# Patient Record
Sex: Female | Born: 1940 | Race: White | Hispanic: No | Smoking: Never smoker
Health system: Southern US, Community
[De-identification: ages and names within clinical notes are randomized; demographics above are authoritative.]

## PROBLEM LIST (undated history)

## (undated) DIAGNOSIS — G43909 Migraine, unspecified, not intractable, without status migrainosus: Secondary | ICD-10-CM

## (undated) DIAGNOSIS — N200 Calculus of kidney: Secondary | ICD-10-CM

## (undated) DIAGNOSIS — T7840XA Allergy, unspecified, initial encounter: Secondary | ICD-10-CM

## (undated) DIAGNOSIS — Z87442 Personal history of urinary calculi: Secondary | ICD-10-CM

## (undated) DIAGNOSIS — E0500 Thyrotoxicosis with diffuse goiter without thyrotoxic crisis or storm: Secondary | ICD-10-CM

## (undated) DIAGNOSIS — M8580 Other specified disorders of bone density and structure, unspecified site: Secondary | ICD-10-CM

## (undated) DIAGNOSIS — E079 Disorder of thyroid, unspecified: Secondary | ICD-10-CM

## (undated) DIAGNOSIS — E782 Mixed hyperlipidemia: Secondary | ICD-10-CM

## (undated) DIAGNOSIS — E039 Hypothyroidism, unspecified: Secondary | ICD-10-CM

## (undated) DIAGNOSIS — I10 Essential (primary) hypertension: Secondary | ICD-10-CM

## (undated) DIAGNOSIS — H8109 Meniere's disease, unspecified ear: Secondary | ICD-10-CM

## (undated) DIAGNOSIS — C4491 Basal cell carcinoma of skin, unspecified: Secondary | ICD-10-CM

## (undated) DIAGNOSIS — J309 Allergic rhinitis, unspecified: Secondary | ICD-10-CM

## (undated) DIAGNOSIS — E119 Type 2 diabetes mellitus without complications: Secondary | ICD-10-CM

## (undated) DIAGNOSIS — E559 Vitamin D deficiency, unspecified: Secondary | ICD-10-CM

## (undated) DIAGNOSIS — H409 Unspecified glaucoma: Secondary | ICD-10-CM

## (undated) DIAGNOSIS — C801 Malignant (primary) neoplasm, unspecified: Secondary | ICD-10-CM

## (undated) DIAGNOSIS — H269 Unspecified cataract: Secondary | ICD-10-CM

## (undated) DIAGNOSIS — E785 Hyperlipidemia, unspecified: Secondary | ICD-10-CM

## (undated) DIAGNOSIS — T8859XA Other complications of anesthesia, initial encounter: Secondary | ICD-10-CM

## (undated) HISTORY — DX: Mixed hyperlipidemia: E78.2

## (undated) HISTORY — DX: Calculus of kidney: N20.0

## (undated) HISTORY — PX: SPINE SURGERY: SHX786

## (undated) HISTORY — DX: Vitamin D deficiency, unspecified: E55.9

## (undated) HISTORY — DX: Migraine, unspecified, not intractable, without status migrainosus: G43.909

## (undated) HISTORY — DX: Meniere's disease, unspecified ear: H81.09

## (undated) HISTORY — PX: CATARACT EXTRACTION: SUR2

## (undated) HISTORY — DX: Hyperlipidemia, unspecified: E78.5

## (undated) HISTORY — DX: Malignant (primary) neoplasm, unspecified: C80.1

## (undated) HISTORY — PX: FLEXIBLE SIGMOIDOSCOPY: SHX1649

## (undated) HISTORY — PX: ABDOMINAL HYSTERECTOMY: SHX81

## (undated) HISTORY — DX: Unspecified glaucoma: H40.9

## (undated) HISTORY — DX: Disorder of thyroid, unspecified: E07.9

## (undated) HISTORY — DX: Essential (primary) hypertension: I10

## (undated) HISTORY — DX: Thyrotoxicosis with diffuse goiter without thyrotoxic crisis or storm: E05.00

## (undated) HISTORY — PX: OTHER SURGICAL HISTORY: SHX169

## (undated) HISTORY — DX: Type 2 diabetes mellitus without complications: E11.9

## (undated) HISTORY — DX: Other specified disorders of bone density and structure, unspecified site: M85.80

## (undated) HISTORY — PX: EYE SURGERY: SHX253

## (undated) HISTORY — DX: Allergy, unspecified, initial encounter: T78.40XA

## (undated) HISTORY — DX: Allergic rhinitis, unspecified: J30.9

## (undated) HISTORY — PX: COLONOSCOPY: SHX174

## (undated) HISTORY — DX: Basal cell carcinoma of skin, unspecified: C44.91

## (undated) HISTORY — DX: Unspecified cataract: H26.9

---

## 1999-11-29 ENCOUNTER — Other Ambulatory Visit: Admission: RE | Admit: 1999-11-29 | Discharge: 1999-11-29 | Payer: Self-pay | Admitting: Podiatry

## 2000-08-23 ENCOUNTER — Encounter: Admission: RE | Admit: 2000-08-23 | Discharge: 2000-08-23 | Payer: Self-pay | Admitting: Family Medicine

## 2000-08-23 ENCOUNTER — Encounter: Payer: Self-pay | Admitting: Family Medicine

## 2000-08-29 ENCOUNTER — Encounter: Admission: RE | Admit: 2000-08-29 | Discharge: 2000-08-29 | Payer: Self-pay | Admitting: Family Medicine

## 2000-08-29 ENCOUNTER — Encounter: Payer: Self-pay | Admitting: Family Medicine

## 2000-09-05 ENCOUNTER — Encounter: Payer: Self-pay | Admitting: Family Medicine

## 2000-09-05 ENCOUNTER — Encounter: Admission: RE | Admit: 2000-09-05 | Discharge: 2000-09-05 | Payer: Self-pay | Admitting: Family Medicine

## 2000-09-13 ENCOUNTER — Encounter: Admission: RE | Admit: 2000-09-13 | Discharge: 2000-09-13 | Payer: Self-pay | Admitting: Family Medicine

## 2000-09-13 ENCOUNTER — Encounter: Payer: Self-pay | Admitting: Family Medicine

## 2000-09-14 ENCOUNTER — Encounter: Admission: RE | Admit: 2000-09-14 | Discharge: 2000-09-14 | Payer: Self-pay | Admitting: Family Medicine

## 2000-09-14 ENCOUNTER — Encounter: Payer: Self-pay | Admitting: Family Medicine

## 2001-02-12 ENCOUNTER — Encounter: Payer: Self-pay | Admitting: Family Medicine

## 2001-02-12 ENCOUNTER — Encounter: Admission: RE | Admit: 2001-02-12 | Discharge: 2001-02-12 | Payer: Self-pay | Admitting: Family Medicine

## 2001-12-20 ENCOUNTER — Inpatient Hospital Stay (HOSPITAL_COMMUNITY): Admission: EM | Admit: 2001-12-20 | Discharge: 2001-12-23 | Payer: Self-pay | Admitting: *Deleted

## 2001-12-20 ENCOUNTER — Encounter: Payer: Self-pay | Admitting: Internal Medicine

## 2002-01-06 ENCOUNTER — Encounter: Payer: Self-pay | Admitting: Urology

## 2002-01-06 ENCOUNTER — Ambulatory Visit (HOSPITAL_COMMUNITY): Admission: RE | Admit: 2002-01-06 | Discharge: 2002-01-06 | Payer: Self-pay | Admitting: Urology

## 2002-11-18 ENCOUNTER — Encounter: Admission: RE | Admit: 2002-11-18 | Discharge: 2002-11-18 | Payer: Self-pay | Admitting: Family Medicine

## 2002-11-18 ENCOUNTER — Encounter: Payer: Self-pay | Admitting: Family Medicine

## 2002-12-09 ENCOUNTER — Encounter: Payer: Self-pay | Admitting: Emergency Medicine

## 2002-12-09 ENCOUNTER — Inpatient Hospital Stay (HOSPITAL_COMMUNITY): Admission: EM | Admit: 2002-12-09 | Discharge: 2002-12-10 | Payer: Self-pay | Admitting: Emergency Medicine

## 2003-01-13 ENCOUNTER — Ambulatory Visit (HOSPITAL_COMMUNITY): Admission: RE | Admit: 2003-01-13 | Discharge: 2003-01-13 | Payer: Self-pay | Admitting: Gastroenterology

## 2004-06-20 ENCOUNTER — Encounter: Admission: RE | Admit: 2004-06-20 | Discharge: 2004-06-20 | Payer: Self-pay | Admitting: Family Medicine

## 2004-09-20 ENCOUNTER — Encounter: Admission: RE | Admit: 2004-09-20 | Discharge: 2004-09-20 | Payer: Self-pay | Admitting: Family Medicine

## 2006-04-12 ENCOUNTER — Encounter: Admission: RE | Admit: 2006-04-12 | Discharge: 2006-04-12 | Payer: Self-pay | Admitting: Family Medicine

## 2007-04-29 ENCOUNTER — Encounter: Admission: RE | Admit: 2007-04-29 | Discharge: 2007-04-29 | Payer: Self-pay | Admitting: Family Medicine

## 2008-05-12 ENCOUNTER — Encounter: Admission: RE | Admit: 2008-05-12 | Discharge: 2008-05-12 | Payer: Self-pay | Admitting: Family Medicine

## 2008-07-27 ENCOUNTER — Encounter: Admission: RE | Admit: 2008-07-27 | Discharge: 2008-07-27 | Payer: Self-pay | Admitting: Family Medicine

## 2009-03-25 ENCOUNTER — Encounter: Admission: RE | Admit: 2009-03-25 | Discharge: 2009-03-25 | Payer: Self-pay | Admitting: Gastroenterology

## 2009-06-22 ENCOUNTER — Encounter (HOSPITAL_COMMUNITY): Admission: RE | Admit: 2009-06-22 | Discharge: 2009-09-10 | Payer: Self-pay | Admitting: Internal Medicine

## 2009-06-28 ENCOUNTER — Ambulatory Visit (HOSPITAL_COMMUNITY): Admission: RE | Admit: 2009-06-28 | Discharge: 2009-06-28 | Payer: Self-pay | Admitting: Internal Medicine

## 2010-06-26 ENCOUNTER — Encounter: Payer: Self-pay | Admitting: Internal Medicine

## 2010-10-21 NOTE — Cardiovascular Report (Signed)
[name], [name] NO.:  [id number]   MEDICAL RECORD NO.:  [id number]                   PATIENT TYPE:  INP   LOCATION:  3703                                 FACILITY:  [facility]   PHYSICIAN:  Francisca December, M.D.               DATE OF BIRTH:  04/30/1941   DATE OF PROCEDURE:  12/10/2002  DATE OF DISCHARGE:                              CARDIAC CATHETERIZATION   PROCEDURES PERFORMED:  1. Left heart catheterization.  2. Coronary angiography.  3. Left ventriculogram.   CARDIOLOGIST:  Francisca December, M.D.   INDICATIONS:  Melissa Davenport is 70 year old woman admitted yesterday with  prolonged anterior upper substernal chest discomfort.  This was relieved  with IV nitroglycerin.  She had new T wave inversions on her EKG in the  anterior precordial leads.  A myocardial infarction has been ruled out.  She  is brought now to the cardiac catheterization laboratory to identify  possible CAD as an etiology and provide for further therapeutic options.   PROCEDURAL NOTE:  The patient was brought the cardiac catheterization  laboratory in the fasting state.  The right groin was prepped and draped in  the usual sterile fashion.  Local anesthesia was obtained with the  infiltration of 1% lidocaine.  A 6 French catheter sheath was inserted  percutaneously into the right femoral artery utilizing an anterior approach  over a guiding J wire.  The 110 cm pigtail catheter was used to measure  pressures in the ascending aorta and in the left ventricle, both to and  following the ventriculogram.  A 30-degree RAO cine left ventriculogram was  performed utilizing a power injector.  Cine angiography of each coronary  artery was then conducted using 6 [place] #4 left and right Judkins  catheters.  Cine angiography was performed in multiple LAO and RAO  projections.   At the conclusion of the procedure the catheter and catheter sheath were  removed.  Hemostasis was achieved by  direct pressure.  The patient was  transported to he recovery area in stable condition with an intact distal  pulse.  All catheter manipulations were performed using fluoroscopic  observation and stages over a long guiding J wire.   HEMODYNAMIC DATA:  Systemic arterial pressure was 133/71 with a mean of 96  mmHg.  There was no systolic gradient across the aortic valve.  The left  ventricular end diastolic pressure was 22 mmHg preventriculogram.   ANGIOGRAPHIC DATA:  The left ventriculogram demonstrated normal chamber size  and normal global systolic function.  There were no regional wall motion  abnormalities or coronary calcifications seen.  There was no mitral  regurgitation.  A visual estimate of the ejection fraction was 65%.   There was a right dominant coronary system present.  The left main coronary  artery was normal.   The left anterior descending and its branches were normal.   The left  circumflex coronary artery and its branches were normal.   The right coronary artery and its branches were normal.   No collateral vessels were seen.   FINAL IMPRESSION:  1. Noncardiac ischemic chest pain, likely gastroesophageal reflux disease.  2. Normal coronary arteries.  3. Intact left ventricular size and global systolic function.   PLANS AND RECOMMENDATIONS:  The patient is presented with this gratifying  news.  It would appear that her chest discomfort is likely due to GERD.  We  will begin proton pump inhibitor and plan for outpatient discharge later in  today.                                                 Francisca December, M.D.    JHE/MEDQ  D:  12/10/2002  T:  12/10/2002  Job:  [id number]  Dellis Anes. Idell Pickles, M.D.  [address]  [place]  [place] [address]  Fax: [phone]   Cardiac Catheterization Laboratory   cc:   Dellis Anes. Idell Pickles, M.D.  [address]  [place]  [place] [address]  Fax: [phone]   Cardiac Catheterization Laboratory

## 2010-10-21 NOTE — Op Note (Signed)
TNAMEMCKINZI, ERIKSEN NO.:  [id number]   MEDICAL RECORD NO.:  [id number]                  PATIENT TYPE:   LOCATION:                                       FACILITY:   PHYSICIAN:  Bertram Millard. Dahlstedt, M.D.          DATE OF BIRTH:   DATE OF PROCEDURE:  01/06/2002  DATE OF DISCHARGE:                                 OPERATIVE REPORT   PREOPERATIVE DIAGNOSIS:  Right ureteral calculus with history of  pyelonephritis.   POSTOPERATIVE DIAGNOSIS:  No evidence of ureteral calculus.   PRINCIPAL PROCEDURE:  Cystoscopy, right ureteropyeloscopy.   SURGEON:  Bertram Millard. Dahlstedt, M.D.   ANESTHESIA:  General with LMA.   COMPLICATIONS:  None.   BRIEF HISTORY:  This 70 year old female is status post cystoscopy and  ureteral stent placement on the 18th of July.  She presented with  pyelonephritis and an obstructing right ureteral stone.  An emergent  ureteral stent was placed.  The patient has been recovering from her  pyelonephritis, albeit slowly.  She has had no recent history of fever.   She has been followed up in the office with a KUB.  I have not seen the  stone on the KUB but felt that this may have been a uric acid stone.  It was  recommended that she undergo cystoscopy, stent extraction, and ureteroscopy  and possible extraction of her right ureteral stone.  Risks and  complications of this procedure have been discussed with the patient and are  understood.   DESCRIPTION OF PROCEDURE:  The patient was taken to the operating room after  IV gentamycin was administered intravenously.  She was given a general  anesthetic and placed in the dorsal lithotomy position.  Cystoscopy and  stent extraction was performed.  No stone was seen lying in the bladder.  Rigid and flexible ureteroscopy were then performed on the right ureter.  I  failed to see any stone in her ureter, and the flexible scope was passed up  into her kidney.  Again, I inspected the  entire pyelocaliceal system.  No  stone was seen.  At this point, I did not feel it necessary to leave a stent  in.  The scope was removed, the bladder again inspected, and no stone was  seen.  At this point, the bladder was drained and the procedure terminated.   The patient tolerated the procedure well.  She was awakened, taken to the  PACU in stable condition.   She was discharged home on p.o. antibiotics and will follow up with me in  approximately two weeks.                                              Bertram Millard. Retta Diones, M.D.   SMD/MEDQ  D:  01/06/2002  T:  01/09/2002  Job:  [id number]   cc:   Caryn Bee L. Little, M.D.

## 2010-10-21 NOTE — Discharge Summary (Signed)
[name], PLOURDE NO.:  [id number]   MEDICAL RECORD NO.:  [id number]                   PATIENT TYPE:  INP   LOCATION:  2873                                 FACILITY:  [facility]   PHYSICIAN:  Melissa Davenport, M.D.               DATE OF BIRTH:  [birth year]   DATE OF ADMISSION:  12/09/2002  DATE OF DISCHARGE:  12/10/2002                                 DISCHARGE SUMMARY   ADMISSION DIAGNOSES:  1. Recurrent chest pressure, rule out myocardial infarction.  2. Abnormal electrocardiogram.  3. Hypertension.  4. Obesity.   DISCHARGE DIAGNOSES:  1. Chest pain - not otherwise specified.  Noncardiac.  2. Cardiac cath revealing essentially normal coronary arteries.  3. Abnormal electrocardiogram.  4. Hypertension.  5. Obesity.   HISTORY OF PRESENT ILLNESS:  The patient is a 70 year old, divorced, white  female without prior history of coronary artery disease.  She does have  hypertension and obesity but does not smoke or have any family history of  heart disease.   The patient was in her usual state of health until last Tuesday.  She  developed awareness of chest tightness/discomfort with some discomfort in  the left side of the neck and shoulder.  This seemed to intensify that  evening to a 10/10.  The patient was able to sleep once she got home and  felt better upon awakening.  She went to her primary care  that  morning after an EKG showed a abnormality.  He sent her to [facility]  Emergency Room for further evaluation.   Upon arrival, the patient was given two sublingual nitroglycerin and IV  nitroglycerin to help her pain which had now diminished to about a 2/10.  No  shortness of breath, diaphoresis, nausea, or palpitations.   The patient was started on IV nitroglycerin, and enzymes were order to rule  out MI.  We will treat with Lovenox as well.   PROCEDURE:  Cardiac catheterization, December 10, 2002, by Melissa Davenport, M.D.   COMPLICATIONS:  None.   CONSULTATIONS:  None.   COURSE IN HOSPITAL:  Melissa Davenport was admitted to [facility], as  mentioned above.  EKG was negative for ischemia, and cardiac enzymes were  negative as well.   Cardiac catheterization performed December 10, 2002, revealed essentially normal  coronary arteries with normal LV function.   Dr. Amil Davenport recommended that this was noncardiac chest pain and likely GERD.   He recommended proton pump inhibitor therapy.  Follow up with Melissa Anes.  Davenport, M.D. and discharge to home.   DISCHARGE MEDICATIONS:  1. Protonix 40 mg daily.  2. Metoprolol.  3. Diovan.  4. Synthroid.  5. Aspirin.   She was asked to call the office for any problems or questions.   May shower.   The patient needs to call and set up a follow-up appointment with Melissa Anes.  Melissa Davenport, M.D.  Dr. Amil Davenport will see her on a p.r.n. basis.      Melissa Davenport, P.A.                   Melissa Davenport, M.D.    TCJ/MEDQ  D:  01/15/2003  T:  01/16/2003  Job:  [id number]   cc:   Melissa Anes. Melissa Davenport, M.D.  [address]  [place]  [place] [address]  Fax: [phone]

## 2010-10-21 NOTE — Op Note (Signed)
   NAME:  Melissa Davenport, Melissa Davenport                           ACCOUNT NO.:  [id number]   MEDICAL RECORD NO.:  [id number]                   PATIENT TYPE:  AMB   LOCATION:  ENDO                                 FACILITY:  [facility]   PHYSICIAN:  James L. Malon Kindle., M.D.          DATE OF BIRTH:  Nov 02, 1940   DATE OF PROCEDURE:  01/13/2003  DATE OF DISCHARGE:                                 OPERATIVE REPORT   PROCEDURE:  Colonoscopy.   MEDICATIONS:  1. Fentanyl 100 mcg.  2. Versed 9 mg IV.   INDICATION:  Colon cancer screening.   DESCRIPTION OF PROCEDURE:  The procedure had been explained to the patient  and consent obtained.  The patient in the left lateral decubitus position,  the Olympus scope was inserted, advanced under direct visualization.  The  prep was excellent.  Using abdominal pressure and position changes, we able  to reach the cecum.  The ileocecal valve and appendiceal orifice were seen.  The scope was withdrawn and the colon carefully examined.  No polyps were  seen throughout.  There was no significant diverticular disease.  The rectum  was free of polyps.  The patient was monitored throughout with a cardiac  monitor and received O2 by nasal cannula.   ASSESSMENT:  Essentially normal colonoscopy.   PLAN:  We will recommend yearly Hemoccults and repeating procedure in 5-10  years.                                               James L. Malon Kindle., M.D.    Waldron Session  D:  01/13/2003  T:  01/13/2003  Job:  [id number]   cc:   Caryn Bee L. Little, M.D.  [address]  [place]  [place] [address]  Fax: [phone]

## 2010-10-21 NOTE — Op Note (Signed)
   Melissa Davenport, Melissa Davenport                          ACCOUNT NO.:  [id number]   MEDICAL RECORD NO.:  [id number]                   PATIENT TYPE:  AMB   LOCATION:  DAY                                  FACILITY:  [facility]   PHYSICIAN:  Bertram Millard. Dahlstedt, M.D.          DATE OF BIRTH:  [birth year]   DATE OF PROCEDURE:  DATE OF DISCHARGE:                                 OPERATIVE REPORT   PREOPERATIVE DIAGNOSES:  1. Right ureteral calculus.  2. Pyelonephritis.  3. Hydronephrosis.   INDICATIONS FOR PROCEDURE:  The patient is a 69 year old female who has a  right ureteral stone and pyelonephritis.  This is obstructing.  The patient  was found on CT to have a midureteral stone.  She has been sick for a few  days with fever to 103.5.  Due to the patient's pyuria and obstructing  ureteral calculus, it was recommended that she undergo cysto and stent  placement emergently.  The stone is not to be removed at the present time  but treatment will be offered down the road.   DESCRIPTION OF PROCEDURE:  The patient was administered general anesthetic  and placed in dorsal lithotomy position.  Genitalia and perineum were  prepped and draped.  The cystoscope was advanced into her bladder which  appeared normal.  The right ureter was cannulated with a 0.038 inch  guidewire which was advanced into the right renal pelvis fluoroscopically.  Over this a 7 French x 24 cm double-J stent was placed and the guidewire  removed.  Good curls were seen proximally and distally.  The bladder was  drained.  The patient was awakened, extubated and taken to PACU in stable  condition.                                                 Bertram Millard. Dahlstedt, M.D.    SMD/MEDQ  D:  01/02/2002  T:  01/07/2002  Job:  [id number]

## 2010-10-21 NOTE — Discharge Summary (Signed)
[facility]  Patient:    Melissa Davenport, Melissa Davenport Visit Number: [id number] MRN: [id number]          Service Type: MED Location: 3W [id number] Attending Physician:  Jackie Plum Dictated by:   Jackie Plum, M.D. Admit Date:  12/20/2001 Discharge Date: 12/23/2001   CC:         Caryn Bee L. Little, M.D.  Bertram Millard. Dahlstedt, M.D.   Discharge Summary  DISCHARGE DIAGNOSES:  1. Pyelonephritis with early sepsis - resolving.  2. Right mid ureteric stone with hydronephrosis by CT done on     December 20, 2001.     a. Status post cystoscopy and right ureteric J stent placing on        December 20, 2001, by Dr. Retta Diones.  3. Leukocytosis secondary to discharge diagnosis #1 - resolving.  4. History of cough.  No evidence of acute lung process by x-ray of     December 20, 2001.  Probably secondary to ACE inhibitor medication.  5. History of hypertension.  6. History of migraine headaches.  7. Obesity.  8. History of allergic rhinitis.  9. History of hypothyroidism. 10. Postmenopausal. 11. Normocytic anemia - mild, asymptomatic.  Likely secondary to discharge     diagnosis #1.  Will need outpatient follow-up.  Discharge hemoglobin 11.0     with MC of 92.4. 12. Hyperglycemia - mild, like secondary to IV fluid.  DISCHARGE LABORATORY DATA:  WBC count 12.5, hemoglobin 11.0, hematocrit 1.9, MCV 2.4, platelet count 216.  Sodium 140, potassium 4.4, chloride 110, CO2 26, glucose 123, BUN 12, creatinine 0.9, calcium 8.0.  Hemoccult negative.  DISCHARGE MEDICATIONS:  The patient is to restart all her preadmission medications as previously.  These medications include: 1. Toprol XL 50 q.d. 2. Diovan/HCTZ 80/12.5, 1 tab p.o. q.d. 3. Flonase 2 sprays q.d. 4. Synthroid 112 mcg p.o. q.d. 5. Darvocet p.r.n.  In addition the following new medications have been prescribed for patient: Ciprofloxacin 500 mg p.o. q.12h. x 7 days and Vicodin 1 p.o. q.4h. p.r.n. pain.  ACTIVITIES:  As  tolerated.  DIET:  Low salt diet.  SPECIAL INSTRUCTIONS: 1. She is to drink plenty of fluids. 2. She is to call M.D. if she has any problems including a fever or chills as    well as worsening flank pain. 3. Dr. Retta Diones has furnished patient with his telephone number for the    patient to call for any questions or problems.  FOLLOW-UP: 1. Will be with Dr. Retta Diones in about one week.  Dr. Barbra Sarks office will    call to scheduled the patients appointment.  I have advised patient to    call Dr. Barbra Sarks office should she not hear from them within a few    days. 2. Follow up with Dr. Clarene Duke in about three weeks.  She is call for    appointment.  HISTORY OF PRESENT ILLNESS/SYNOPSIS OF HOSPITAL COURSE:  Ms. Suliman was admitted on December 20, 2001, for ureteric stone which was elucidated by CT scan after presenting with abdominal pain and right flank pain.  She had been having fevers on and off for about a week prior to presentation.  On admission, the patient was flushed and looked uncomfortable.  Temperature was 103.9 degrees F with BP of 123/78.  Pulse was 114 per minute.  Respiratory rate was 19 per minute.  O2 saturation was 98%.  Cardiopulmonary exam was unremarkable.  Abdominal exam was notable for right costovertebral angle tenderness with normal bowel sounds.  There was no definite organomegaly.  Lab work was notable for leukocytosis of 17,200.  The patient was admitted for right mid ureteric stone complicated with pyelonephritis and hydronephrosis based on imaging status.  HOSPITAL COURSE:  The patient was initiated on IV fluid supplementation and IV antibiotics.  Urology was consulted, and cystoscopy with right j stent placement was done on December 20, 2001, as noted above.  She received adequate analgesia for her pain.  With these measures, the patients fevers resolved with resolution of her leukocytosis to the current level on discharge. Tachycardia also resolved,  appetite improved.  Urine culture and blood cultures were unremarkable.  The patient is being discharged today to follow up with her urologist, Dr. Retta Diones, as mentioned above.  Cough.  The patient gives history of a persistent, annoying cough.  X-ray was unremarkable for any pulmonary process, patient on ACE inhibitor.  She already started taking guaifenesin prior to admission and recommend that patients primary care physician reevaluate this and also being on Darvon p.r.n.  CONSULTANT:  Dr. Retta Diones of urology proceeded with cystoscopy with right J stent placement on December 20, 2001.  CONDITION ON DISCHARGE:  Improved.  DISPOSITION:  The patient is go to home to be with family. Dictated by:   Jackie Plum, M.D. Attending Physician:  Jackie Plum DD:  12/23/01 TD:  12/26/01 Job: [id number]

## 2010-10-21 NOTE — H&P (Signed)
Melissa Davenport, Melissa Davenport NO.:  [id number]   MEDICAL RECORD NO.:  [id number]                   PATIENT TYPE:  INP   LOCATION:                                       FACILITY:  [facility]   PHYSICIAN:  Francisca December, M.D.               DATE OF BIRTH:  Oct 07, 1940   DATE OF ADMISSION:  12/09/2002  DATE OF DISCHARGE:                                HISTORY & PHYSICAL   ADMISSION DIAGNOSES:  1. Recurrent chest pressure, rule out myocardial infarction.  2. Abnormal electrocardiogram.  3. Hypertension.  4. Obesity.   CHIEF COMPLAINT:  Chest pressure since last Tuesday with occasional  exacerbation.   HISTORY OF PRESENT ILLNESS:  The patient is a pleasant 70 year old divorced  white female without prior cardiac history. She does have hypertension and  obesity. She states her cholesterol is within normal limits.  She does not  smoke and does not have any family members with premature coronary artery  disease.   The patient states that she was in her usual state of healthy until last  Tuesday.  During work she developed an awareness of chest  tightness/discomfort with similar discomfort in the left side of her neck  and shoulder.  This seemed to intensify that evening to about a 10/10.  The  patient was able to go to sleep once she got home and felt better upon  awakening.  She had persistent mild maybe 5/10 discomfort in the same  location over the remainder of the week and had a recurrent exacerbation on  Saturday of 10/10 discomfort and just felt tired.  There did not seem to  be any associated nausea or shortness of breath.  She was able to sleep in  until 11 o'clock on Sunday morning and by that evening she felt better, but  still with this awareness of discomfort.   She spoke with her primary care  today and saw him in the office in  regards to this recurrent chest discomfort.  He performed an EKG which  showed abnormality new from prior EKG's and  recommended that she go to  [facility] emergency room for further evaluation.  Upon arrival to  the emergency room, she was given two sublingual nitroglycerin and IV  nitroglycerin and her discomfort has now diminished to about a 2/10.  There  is no associated symptoms of shortness of breath, diaphoresis, nausea, or  palpitations. She states the discomfort has basically been there constantly  since last week and has had just two significant exacerbations really  unrelated to activity.   She states she had a stress test approximately five years ago at [facility] and was told that the problems were related to the esophagus, but  was never put on any medications or any further testing performed.   ALLERGIES:  No known drug allergies.   MEDICATIONS:  1.  Metoprolol.  2. Diovan.  3. Synthroid.  4. Aspirin (dose unknown).   PAST MEDICAL HISTORY:  1. Hypertension.  2. Hypothyroidism.   Denies diabetes, seizure, stroke, or prior coronary artery disease.   SOCIAL HISTORY:  The patient is divorced. She lives alone. She has two  children and two grandchildren. She works full time at [employer] as a Development worker, international aid.   Of note, she has had recent problems with a bulging disk in her lumbar spine  and was referred for physical therapy about two to three weeks ago. She has  been doing mild treadmill exercises as well as stretching exercises using  her arms, has had increased stress taking care of her elderly mother and  states that these things have been a change to her normal routine. She  denies chest pain when she was walking on the treadmill.   FAMILY HISTORY:  Her father died of congestive heart failure.  Her mother  has coronary artery disease and apparently has had angioplasties in the  past, but no premature heart disease.  Problems with pacemaker.  Two sisters  who have no heart disease.   REVIEW OF SYSTEMS:  The patient denies fevers, chills, coughs, or cold   symptoms.  No lightheadedness, dizziness, presyncope, or syncope. No  palpitations. No indigestion or heartburn. No melena or hematuria. No  dysuria.  No significant lower extremity edema or swelling in the belly. No  PND or orthopnea.  See HPI.   PHYSICAL EXAMINATION:  VITAL SIGNS: Blood pressure 138/74, pulse 86,  respirations 18.  GENERAL:  The patient is alert and oriented x3, in no acute distress, lying  on the gurney with her head elevated.  She is attended by her two daughters.  She is wearing glasses.  (Of note, when I first walked into the room, the  patient was sleeping, but easily awoken).  HEENT:  Normocephalic and atraumatic.  Wearing glasses.  NECK:  Supple without bruits or masses. No JVD appreciated.  LUNGS:  Clear to auscultation with no chest excursion. No crackles or  wheezes.  HEART:  Regular rate and rhythm without murmurs, rubs, or gallops.  ABDOMEN:  Soft, obese, nontender, and nondistended.  Normal bowel sounds x4  quadrants.  No hepatosplenomegaly. No bruits.  2+ femoral pulses bilaterally  without bruits.  2+ distal pulses bilaterally without edema.  NEUROLOGY:  Nonfocal. Mentation intact.   EKG performed in the emergency room shows normal sinus rhythm with  nonspecific ST T wave changes in 1, aVL, V1 through V5, and essentially flat  ST segment in 3 and aVF.   EKG faxed from Ukiah L. Little, M.D.'s office is difficult to interpret (the  one performed today) but does seem to show the same nonspecific changes.  An  EKG done the year prior, again difficult to read. Does not show any  significant or obvious T wave inversions or ST segment flattening.   LABORATORY DATA:  Pending.   IV nitroglycerin has been started by the emergency room and the patient  currently states that she is having maybe at most 2/10 pressure.   IMPRESSION:  1. Atypical chest pain - question anginal equivalent, rule out myocardial     infarction.  2. Abnormal electrocardiogram. 3.  Hypertension.  4. Obesity.  5. Unknown lipid status.  6. Hypothyroidism.  7. Bulging disk lumbar spine.   PLAN:  The patient will be admitted to [facility] by Dr. Amil Amen.  Will place her on a regular telemetry unit.  IV nitroglycerin and Lovenox.  Will check appropriate laboratory studies and cardiac enzymes.  Will check a  fasting lipid profile in the morning.  Will discuss the case with Dr.  Amil Amen - question cardiac catheterization versus repeat stress test - will  look for stress test done five years ago.     [name], P.A.                   Francisca December, M.D.    TCJ/MEDQ  D:  12/09/2002  T:  12/09/2002  Job:  [id number]   cc:   Caryn Bee L. Little, M.D.  [address]  [place]  [place] [address]  Fax: [phone]    cc:   Anna Genre. Little, M.D.  [address]  [place]  [place] [address]  Fax: [phone]

## 2010-11-18 ENCOUNTER — Other Ambulatory Visit: Payer: Self-pay | Admitting: Family Medicine

## 2010-11-18 DIAGNOSIS — Z780 Asymptomatic menopausal state: Secondary | ICD-10-CM

## 2010-11-22 ENCOUNTER — Ambulatory Visit
Admission: RE | Admit: 2010-11-22 | Discharge: 2010-11-22 | Disposition: A | Discharge status: Home / Self Care | Payer: PRIVATE HEALTH INSURANCE | Source: Ambulatory Visit | Attending: Family Medicine | Admitting: Family Medicine

## 2010-11-22 DIAGNOSIS — Z780 Asymptomatic menopausal state: Secondary | ICD-10-CM

## 2011-12-20 DIAGNOSIS — I10 Essential (primary) hypertension: Secondary | ICD-10-CM | POA: Insufficient documentation

## 2011-12-20 DIAGNOSIS — H532 Diplopia: Secondary | ICD-10-CM | POA: Insufficient documentation

## 2011-12-20 DIAGNOSIS — E0500 Thyrotoxicosis with diffuse goiter without thyrotoxic crisis or storm: Secondary | ICD-10-CM | POA: Insufficient documentation

## 2011-12-20 DIAGNOSIS — IMO0002 Reserved for concepts with insufficient information to code with codable children: Secondary | ICD-10-CM | POA: Insufficient documentation

## 2011-12-20 DIAGNOSIS — M1990 Unspecified osteoarthritis, unspecified site: Secondary | ICD-10-CM | POA: Insufficient documentation

## 2012-03-28 DIAGNOSIS — H02889 Meibomian gland dysfunction of unspecified eye, unspecified eyelid: Secondary | ICD-10-CM | POA: Insufficient documentation

## 2012-03-28 DIAGNOSIS — H01009 Unspecified blepharitis unspecified eye, unspecified eyelid: Secondary | ICD-10-CM | POA: Insufficient documentation

## 2012-04-17 DIAGNOSIS — H40059 Ocular hypertension, unspecified eye: Secondary | ICD-10-CM | POA: Insufficient documentation

## 2012-04-17 DIAGNOSIS — Z961 Presence of intraocular lens: Secondary | ICD-10-CM | POA: Insufficient documentation

## 2012-04-17 DIAGNOSIS — H40009 Preglaucoma, unspecified, unspecified eye: Secondary | ICD-10-CM | POA: Insufficient documentation

## 2012-07-24 ENCOUNTER — Other Ambulatory Visit: Payer: Self-pay | Admitting: Family Medicine

## 2012-07-24 DIAGNOSIS — Z1231 Encounter for screening mammogram for malignant neoplasm of breast: Secondary | ICD-10-CM

## 2012-07-31 ENCOUNTER — Ambulatory Visit
Admission: RE | Admit: 2012-07-31 | Discharge: 2012-07-31 | Disposition: A | Discharge status: Home / Self Care | Payer: Medicare Other | Source: Ambulatory Visit | Attending: Family Medicine | Admitting: Family Medicine

## 2012-07-31 DIAGNOSIS — Z1231 Encounter for screening mammogram for malignant neoplasm of breast: Secondary | ICD-10-CM

## 2012-10-09 DIAGNOSIS — E079 Disorder of thyroid, unspecified: Secondary | ICD-10-CM | POA: Insufficient documentation

## 2013-04-17 DIAGNOSIS — H02539 Eyelid retraction unspecified eye, unspecified lid: Secondary | ICD-10-CM | POA: Insufficient documentation

## 2013-04-17 DIAGNOSIS — H0520 Unspecified exophthalmos: Secondary | ICD-10-CM | POA: Insufficient documentation

## 2013-05-08 ENCOUNTER — Encounter: Payer: Medicare Other | Attending: Family Medicine

## 2013-05-08 VITALS — Ht 63.0 in | Wt 215.0 lb

## 2013-05-08 DIAGNOSIS — Z713 Dietary counseling and surveillance: Secondary | ICD-10-CM | POA: Insufficient documentation

## 2013-05-08 DIAGNOSIS — E119 Type 2 diabetes mellitus without complications: Secondary | ICD-10-CM | POA: Insufficient documentation

## 2013-05-12 NOTE — Progress Notes (Signed)
Patient was seen on 05/08/13 for the first of a series of three diabetes self-management courses at [facility].  Current HbA1c: 6.5%  The following learning objectives were met by the patient during this class:  Describe diabetes  State some common risk factors for diabetes  Defines the role of glucose and insulin  Identifies type of diabetes and pathophysiology  Describe the relationship between diabetes and cardiovascular risk  State the members of the Healthcare Team  States the rationale for glucose monitoring  State when to test glucose  State their individual Target Range  State the importance of logging glucose readings  Describe how to interpret glucose readings  Identifies A1C target  Explain the correlation between A1c and eAG values  State symptoms and treatment of high blood glucose  State symptoms and treatment of low blood glucose  Explain proper technique for glucose testing  Identifies proper sharps disposal  Handouts given during class include:  Living Well with Diabetes book  Carb Counting and Meal Planning book  Meal Plan Card  Carbohydrate guide  Meal planning worksheet  Low Sodium Flavoring Tips  The diabetes portion plate  Low Carbohydrate Snack Suggestions  A1c to eAG Conversion Chart  Diabetes Medications  Stress Management  Diabetes Recommended Care Schedule  Diabetes Success Plan  Core Class Satisfaction Survey  Your patient has identified their diabetes care support plan as:  [facility]  Staff  Follow-Up Plan:  Attend core 2

## 2013-05-15 DIAGNOSIS — E119 Type 2 diabetes mellitus without complications: Secondary | ICD-10-CM

## 2013-05-15 NOTE — Progress Notes (Signed)
Patient was seen on 05/15/13 for the second of a series of three diabetes self-management courses at the [facility]. The following learning objectives were met by the patient during this class:   Describe the role of different macronutrients on glucose  Explain how carbohydrates affect blood glucose  State what foods contain the most carbohydrates  Demonstrate carbohydrate counting  Demonstrate how to read Nutrition Facts food label  Describe effects of various fats on heart health  Describe the importance of good nutrition for health and healthy eating strategies  Describe techniques for managing your shopping, cooking and meal planning  List strategies to follow meal plan when dining out  Describe the effects of alcohol on glucose and how to use it safely  Goals:  Follow Diabetes Meal Plan as instructed  Eat 3 meals and 2 snacks, every 3-5 hrs   Aim for 2 Carb Choices per meal (30 grams) +/- 1 either way  Aim for 0-1 Carbs per snack if hungry  Add lean protein foods to meals/snacks  Monitor glucose levels as instructed by your doctor   Follow-Up Plan:  Attend Core 3  Work towards following your personal food plan.

## 2013-05-22 DIAGNOSIS — E119 Type 2 diabetes mellitus without complications: Secondary | ICD-10-CM

## 2013-05-22 NOTE — Progress Notes (Signed)
Patient was seen on 05/22/13 for the third of a series of three diabetes self-management courses at the [facility]. The following learning objectives were met by the patient during this class:    State the amount of activity recommended for healthy living   Describe activities suitable for individual needs   Identify ways to regularly incorporate activity into daily life   Identify barriers to activity and ways to over come these barriers  Identify diabetes medications being personally used and their primary action for lowering glucose and possible side effects   Describe role of stress on blood glucose and develop strategies to address psychosocial issues   Identify diabetes complications and ways to prevent them  Explain how to manage diabetes during illness   Evaluate success in meeting personal goal   Establish 2-3 goals that they will plan to diligently work on until they return for the  60-month follow-up visit  Goals:  Follow Diabetes Meal Plan as instructed  Aim for 15-30 mins of physical activity daily as tolerated  Bring food record and glucose log to your follow up visit  Your patient has established the following 4 month goals in their individualized success plan:  None noted  Your patient has identified these potential barriers to change:  None noted  Your patient has identified their diabetes self-care support plan as  [facility] Support Group

## 2013-08-05 ENCOUNTER — Ambulatory Visit (INDEPENDENT_AMBULATORY_CARE_PROVIDER_SITE_OTHER): Payer: Medicare HMO | Admitting: Cardiovascular Disease

## 2013-08-05 ENCOUNTER — Encounter: Payer: Self-pay | Admitting: Cardiovascular Disease

## 2013-08-05 VITALS — BP 140/82 | HR 66 | Ht 63.0 in | Wt 207.0 lb

## 2013-08-05 DIAGNOSIS — S91339A Puncture wound without foreign body, unspecified foot, initial encounter: Secondary | ICD-10-CM

## 2013-08-05 DIAGNOSIS — S91309A Unspecified open wound, unspecified foot, initial encounter: Secondary | ICD-10-CM

## 2013-08-05 NOTE — Progress Notes (Addendum)
08/05/2013 Melissa Davenport   02/12/1941  [id number]  Primary Physician Gennette Pac, MD Primary Cardiologist: new patient  HPI:  Pt that comes today referred by her Podiatrist with complaints about slow healing wound. She has her left 1st toe nail removed in December /2014 and still has scar present. No drainage or change in color has been noted. Her foot doctor mentioned noticing slow pedal pulses and decided to refer her to [place]. She also has past medical history significant for DM, HTN, Hypothyroidism that she controls with oral therapy and reports compliance without noticing side effects. She denies dyspnea, orthopnea, chest pain claudication or LE edema.   Current Outpatient Prescriptions  Medication Sig Dispense Refill  . aspirin 81 MG tablet Take 81 mg by mouth daily.      . bimatoprost (LUMIGAN) 0.01 % SOLN Apply 1 drop to eye daily.      . calcium citrate-vitamin D (SM CALCIUM CITRATE-VIT D) 315-200 MG-UNIT per tablet Take 1 tablet by mouth daily.      . cetirizine (ZYRTEC) 10 MG tablet Take 1 tablet by mouth as needed.      . Cholecalciferol (VITAMIN D-1000 MAX ST) 1000 UNITS tablet Take 1 tablet by mouth daily.      . diclofenac (CATAFLAM) 50 MG tablet Take 50 mg by mouth daily.      . dorzolamide-timolol (COSOPT) 22.3-6.8 MG/ML ophthalmic solution Place 1 drop into both eyes 2 (two) times daily.      Marland Kitchen erythromycin ophthalmic ointment Apply ointment in both eyes at bedtime      . fluticasone (FLONASE) 50 MCG/ACT nasal spray Use only needed      . levothyroxine (SYNTHROID, LEVOTHROID) 100 MCG tablet Take 100 mcg by mouth daily before breakfast.      . losartan-hydrochlorothiazide (HYZAAR) 50-12.5 MG per tablet Take 1 tablet by mouth daily.      . metoprolol succinate (TOPROL-XL) 100 MG 24 hr tablet Take 50 mg by mouth daily.      . Multiple Vitamin (MULTI-VITAMINS) TABS Take 1 tablet by mouth daily.      . niacin 500 MG tablet Take 1 tablet by mouth daily.      . Omega-3  1000 MG CAPS Take 3 tablets by mouth daily.      . pravastatin (PRAVACHOL) 10 MG tablet Take 1 tablet by mouth daily.      . Selenium 200 MCG CAPS Take 1 capsule by mouth daily.       No current facility-administered medications for this visit.    Allergies  Allergen Reactions  . Flagyl [Metronidazole]   . Ketek [Telithromycin]   . Levaquin [Levofloxacin] Nausea Only    History   Social History  . Marital Status: Divorced    Spouse Name: N/A    Number of Children: N/A  . Years of Education: N/A   Occupational History  . Not on file.   Social History Main Topics  . Smoking status: Never Smoker   . Smokeless tobacco: Not on file  . Alcohol Use: No  . Drug Use: No  . Sexual Activity: Not on file   Other Topics Concern  . Not on file   Social History Narrative  . No narrative on file     Review of Systems: General: negative for chills, fever, night sweats or weight changes.  Cardiovascular: negative for chest pain, dyspnea on exertion, edema, orthopnea, palpitations, paroxysmal nocturnal dyspnea or shortness of breath Dermatological: negative for rash Respiratory: negative for  cough or wheezing Urologic: negative for hematuria Abdominal: negative for nausea, vomiting, diarrhea, bright red blood per rectum, melena, or hematemesis Neurologic: negative for visual changes, syncope, or dizziness All other systems reviewed and are otherwise negative except as noted above.   Blood pressure 140/82, pulse 66, height 5\' 3"  (1.6 m), weight 207 lb (93.895 kg).  General appearance: alert, cooperative and no distress Neck: no adenopathy, no carotid bruit, no JVD and supple, symmetrical, trachea midline Lungs: clear to auscultation bilaterally Heart: regular rate and rhythm, S1, S2 normal, no murmur, click, rub or gallop Extremities: left 1st toe with scab present. No erythema, edema or drainage. Good pedal and posterior tibial pulses bilaterally.   EKG not  indicated  ASSESSMENT AND PLAN:   Slow wound healing or 1st left toe: LE arterial dopplers and f/u PRN pending on test results.  @JB @ 08/05/2013 11:37 AM   I agree with the assessment and plan with Dr. Dorthula Nettles. Patient was sent by podiatrist, Dr. Geroge Baseman, for a slowly healing right great toe. She does have positive cardiovascular risk factors are palpable pedal pulses. She denies claudication. Will get arterial Doppler studies to further evaluate this.  Lorretta Harp, M.D., [place], [facility], [place] [address] [place], [place]  [address]  [phone] 08/05/2013 12:48 PM

## 2013-08-05 NOTE — Patient Instructions (Signed)
  We will see you back in follow up only as needed based on the results of the doppler.  Dr Gwenlyn Found has ordered lower extremity arterial doppler.

## 2013-08-14 ENCOUNTER — Ambulatory Visit (HOSPITAL_COMMUNITY)
Admission: RE | Admit: 2013-08-14 | Discharge: 2013-08-14 | Disposition: A | Discharge status: Home / Self Care | Payer: Medicare HMO | Source: Ambulatory Visit | Attending: Cardiovascular Disease | Admitting: Cardiovascular Disease

## 2013-08-14 DIAGNOSIS — W268XXA Contact with other sharp object(s), not elsewhere classified, initial encounter: Secondary | ICD-10-CM | POA: Insufficient documentation

## 2013-08-14 DIAGNOSIS — S91309A Unspecified open wound, unspecified foot, initial encounter: Secondary | ICD-10-CM | POA: Insufficient documentation

## 2013-08-14 DIAGNOSIS — S91339A Puncture wound without foreign body, unspecified foot, initial encounter: Secondary | ICD-10-CM

## 2013-08-14 NOTE — Progress Notes (Signed)
Lower Extremity Arterial Duplex Completed. °Brianna L Mazza,RVT °

## 2013-08-25 ENCOUNTER — Encounter: Payer: Self-pay | Admitting: *Deleted

## 2013-09-24 ENCOUNTER — Encounter: Payer: Medicare HMO | Attending: Family Medicine | Admitting: *Deleted

## 2013-09-24 ENCOUNTER — Encounter: Payer: Self-pay | Admitting: *Deleted

## 2013-09-24 VITALS — Ht 63.0 in | Wt 206.3 lb

## 2013-09-24 DIAGNOSIS — Z713 Dietary counseling and surveillance: Secondary | ICD-10-CM | POA: Insufficient documentation

## 2013-09-24 DIAGNOSIS — E119 Type 2 diabetes mellitus without complications: Secondary | ICD-10-CM | POA: Insufficient documentation

## 2013-09-24 NOTE — Progress Notes (Signed)
  Patient was seen on 09/24/13 for their 4 month follow-up as a part of the diabetes self-management courses at the [facility].   Patient self reports the following:  Medication: None for T2DM A1c 6.5% 05/2013  down to 5.9% Glucose: FBS 77-97 mg/dl Exercise: Unable due to toenail surgery Meal Plan: Not counting carbs. She is making good food choices and portion control. We reviewed the nutritional guidelines to maintain good glucose  Diabetes control has improved since diabetes self-management training: Significantly  Confidence with ability to manage diabetes: High Willingness to participate in diabetes support group: Not at this time  Follow-Up Plan: Consider Lynnae Sandhoff / [facility] reduced calorie bread (45-50)  Almond butter vrs. Peanut Butter Pasta total of 1C for a meal Consider [place] for snacks  Consider walking / join [employer] program at the [facility]  Patient to call and schedule as needed.

## 2013-09-24 NOTE — Patient Instructions (Addendum)
Consider Lynnae Sandhoff / [facility] reduced calorie bread (45-50)  Almond butter vrs. Peanut Butter Pasta total of 1C for a meal Consider [place] for snacks  Consider walking / join [employer] of [employer] program at the [employer]

## 2013-11-13 ENCOUNTER — Other Ambulatory Visit: Payer: Self-pay | Admitting: *Deleted

## 2013-11-13 ENCOUNTER — Ambulatory Visit (INDEPENDENT_AMBULATORY_CARE_PROVIDER_SITE_OTHER): Payer: Medicare HMO | Admitting: Podiatry

## 2013-11-13 ENCOUNTER — Ambulatory Visit (INDEPENDENT_AMBULATORY_CARE_PROVIDER_SITE_OTHER): Payer: Medicare HMO

## 2013-11-13 ENCOUNTER — Encounter: Payer: Self-pay | Admitting: *Deleted

## 2013-11-13 ENCOUNTER — Encounter: Payer: Self-pay | Admitting: Podiatry

## 2013-11-13 VITALS — BP 117/69 | HR 63 | Resp 16 | Ht 63.0 in | Wt 200.0 lb

## 2013-11-13 DIAGNOSIS — E119 Type 2 diabetes mellitus without complications: Secondary | ICD-10-CM

## 2013-11-13 DIAGNOSIS — Q828 Other specified congenital malformations of skin: Secondary | ICD-10-CM

## 2013-11-13 DIAGNOSIS — E1149 Type 2 diabetes mellitus with other diabetic neurological complication: Secondary | ICD-10-CM

## 2013-11-13 NOTE — Progress Notes (Signed)
   Subjective:    Patient ID: Melissa Davenport, female    DOB: 04-07-1941, 73 y.o.   MRN: [id number]  HPI Comments: Problem with the ball of my foot, seems to be some sort of skin there. Its sore and painful at times. Left foot 4th met callused lesion.  i am diabetic for a few years. a1c 5.9      Review of Systems  HENT: Positive for sinus pressure.   Endocrine:       Diabetes   All other systems reviewed and are negative.      Objective:   Physical Exam: I have reviewed her past medical history medications allergies surgeries social history and review of systems. Pulses are palpable bilateral neurologic sensorium is intact per since once the monofilament bilateral. Deep tendon reflexes are brisk and equal bilateral. Muscle strength is 5 over 5 dorsiflexors plantar flexors inverters everters all intrinsic musculature is intact. Orthopedic evaluation demonstrates all joints distal to the ankle a full range of motion without crepitus mild HAV deformity hammertoe deformities are noted or a symptomatic. Cutaneous evaluation demonstrates supple well hydrated cutis with exception of a porokeratotic lesion plantar aspect of the left foot. I debrided this thoroughly today.  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  Plan: Debridement of reactive hyperkeratosis followup with me as needed        Assessment & Plan:

## 2013-11-13 NOTE — Patient Instructions (Signed)
Diabetes and Foot Care Diabetes may cause you to have problems because of poor blood supply (circulation) to your feet and legs. This may cause the skin on your feet to become thinner, break easier, and heal more slowly. Your skin may become dry, and the skin may peel and crack. You may also have nerve damage in your legs and feet causing decreased feeling in them. You may not notice minor injuries to your feet that could lead to infections or more serious problems. Taking care of your feet is one of the most important things you can do for yourself.  HOME CARE INSTRUCTIONS  Wear shoes at all times, even in the house. Do not go barefoot. Bare feet are easily injured.  Check your feet daily for blisters, cuts, and redness. If you cannot see the bottom of your feet, use a mirror or ask someone for help.  Wash your feet with warm water (do not use hot water) and mild soap. Then pat your feet and the areas between your toes until they are completely dry. Do not soak your feet as this can dry your skin.  Apply a moisturizing lotion or petroleum jelly (that does not contain alcohol and is unscented) to the skin on your feet and to dry, brittle toenails. Do not apply lotion between your toes.  Trim your toenails straight across. Do not dig under them or around the cuticle. File the edges of your nails with an emery board or nail file.  Do not cut corns or calluses or try to remove them with medicine.  Wear clean socks or stockings every day. Make sure they are not too tight. Do not wear knee-high stockings since they may decrease blood flow to your legs.  Wear shoes that fit properly and have enough cushioning. To break in new shoes, wear them for just a few hours a day. This prevents you from injuring your feet. Always look in your shoes before you put them on to be sure there are no objects inside.  Do not cross your legs. This may decrease the blood flow to your feet.  If you find a minor scrape,  cut, or break in the skin on your feet, keep it and the skin around it clean and dry. These areas may be cleansed with mild soap and water. Do not cleanse the area with peroxide, alcohol, or iodine.  When you remove an adhesive bandage, be sure not to damage the skin around it.  If you have a wound, look at it several times a day to make sure it is healing.  Do not use heating pads or hot water bottles. They may burn your skin. If you have lost feeling in your feet or legs, you may not know it is happening until it is too late.  Make sure your health care provider performs a complete foot exam at least annually or more often if you have foot problems. Report any cuts, sores, or bruises to your health care provider immediately. SEEK MEDICAL CARE IF:   You have an injury that is not healing.  You have cuts or breaks in the skin.  You have an ingrown nail.  You notice redness on your legs or feet.  You feel burning or tingling in your legs or feet.  You have pain or cramps in your legs and feet.  Your legs or feet are numb.  Your feet always feel cold. SEEK IMMEDIATE MEDICAL CARE IF:   There is increasing redness,   swelling, or pain in or around a wound.  There is a red line that goes up your leg.  Pus is coming from a wound.  You develop a fever or as directed by your health care provider.  You notice a bad smell coming from an ulcer or wound. Document Released: 05/19/2000 Document Revised: 01/22/2013 Document Reviewed: 10/29/2012 [employer] Patient Information 2014 [employer].  

## 2013-12-24 DIAGNOSIS — H02423 Myogenic ptosis of bilateral eyelids: Secondary | ICD-10-CM | POA: Insufficient documentation

## 2013-12-24 DIAGNOSIS — H57819 Brow ptosis, unspecified: Secondary | ICD-10-CM | POA: Insufficient documentation

## 2014-03-17 ENCOUNTER — Ambulatory Visit: Payer: Medicare HMO | Admitting: Podiatry

## 2014-09-17 ENCOUNTER — Ambulatory Visit
Admission: RE | Admit: 2014-09-17 | Discharge: 2014-09-17 | Disposition: A | Discharge status: Home / Self Care | Payer: Medicare Other | Source: Ambulatory Visit | Attending: Family Medicine | Admitting: Family Medicine

## 2014-09-17 ENCOUNTER — Other Ambulatory Visit: Payer: Self-pay | Admitting: Family Medicine

## 2014-09-17 DIAGNOSIS — M545 Low back pain, unspecified: Secondary | ICD-10-CM

## 2014-10-30 ENCOUNTER — Other Ambulatory Visit: Payer: Self-pay | Admitting: Family Medicine

## 2014-10-30 DIAGNOSIS — M8580 Other specified disorders of bone density and structure, unspecified site: Secondary | ICD-10-CM

## 2014-11-04 ENCOUNTER — Ambulatory Visit
Admission: RE | Admit: 2014-11-04 | Discharge: 2014-11-04 | Disposition: A | Discharge status: Home / Self Care | Payer: Medicare Other | Source: Ambulatory Visit | Attending: Family Medicine | Admitting: Family Medicine

## 2014-11-04 DIAGNOSIS — M8580 Other specified disorders of bone density and structure, unspecified site: Secondary | ICD-10-CM

## 2014-11-10 ENCOUNTER — Encounter: Payer: Self-pay | Admitting: Podiatry

## 2014-11-10 ENCOUNTER — Ambulatory Visit (INDEPENDENT_AMBULATORY_CARE_PROVIDER_SITE_OTHER): Payer: Medicare Other | Admitting: Podiatry

## 2014-11-10 VITALS — BP 152/86 | HR 64 | Resp 12

## 2014-11-10 DIAGNOSIS — Q828 Other specified congenital malformations of skin: Secondary | ICD-10-CM

## 2014-11-16 ENCOUNTER — Ambulatory Visit: Payer: Medicare Other | Attending: Family Medicine

## 2014-11-16 DIAGNOSIS — M25651 Stiffness of right hip, not elsewhere classified: Secondary | ICD-10-CM | POA: Insufficient documentation

## 2014-11-16 DIAGNOSIS — M2560 Stiffness of unspecified joint, not elsewhere classified: Secondary | ICD-10-CM | POA: Diagnosis not present

## 2014-11-16 DIAGNOSIS — M545 Low back pain, unspecified: Secondary | ICD-10-CM

## 2014-11-16 DIAGNOSIS — M2569 Stiffness of other specified joint, not elsewhere classified: Secondary | ICD-10-CM

## 2014-11-16 NOTE — Patient Instructions (Signed)
Perform all exercises below:  Hold _20___ seconds. Repeat _3___ times.  Do __3__ sessions per day. CAUTION: Movement should be gentle, steady and slow.  Knee to Chest  Lying supine, bend involved knee to chest. Perform with each leg.   Lumbar Rotation: Caudal - Bilateral (Supine)  Feet and knees together, arms outstretched, rotate knees left, turning head in opposite direction, until stretch is felt.

## 2014-11-16 NOTE — Therapy (Addendum)
[facility] [address], [place], [place], [address] Phone: [phone]   Fax:  [phone]  Physical Therapy Evaluation  Patient Details  Name: Melissa Davenport MRN: [id number] Date of Birth: [birth year] Referring Provider:  Hulan Fess, MD  Encounter Date: 11/16/2014      PT End of Session - 11/16/14 1209    Visit Number 1   Number of Visits 10   Date for PT Re-Evaluation 01/11/15   PT Start Time 1146   PT Stop Time 1243   PT Time Calculation (min) 57 min   Activity Tolerance Patient tolerated treatment well   Behavior During Therapy [facility] for tasks assessed/performed      Past Medical History  Diagnosis Date  . Hypertension   . Diabetes mellitus without complication   . Thyroid disease   . Hyperlipidemia   . Allergy     allergic rhinitis  . Glaucoma   . Migraines   . Cataract   . Cancer     Past Surgical History  Procedure Laterality Date  . Eye surgery    . Abdominal hysterectomy    . Cataract extraction Bilateral   . Spine surgery      There were no vitals filed for this visit.  Visit Diagnosis:  Hip stiffness, right, left - Plan: PT plan of care cert/re-cert  Low back pain associated with a spinal disorder other than radiculopathy or spinal stenosis - Plan: PT plan of care cert/re-cert  Back stiffness - Plan: PT plan of care cert/re-cert      Subjective Assessment - 11/16/14 1201    Subjective Pt reports having stabbing low back pain that radiated to the Rt. hip and buttock area and decreased her ability to walk and take the stairs.  Pain has decreased since seeing the MD on 5/26 and receiving pain medication; it is now a "dull ache like a toothache"    Limitations Sitting;Standing;Walking   How long can you sit comfortably? 10 minutes with a pillow behind her in a sturdy chair    How long can you stand comfortably? 15 - 20 minutes    How long can you walk comfortably? 15 minutes; but has  not noticed tried walking frequently   Patient Stated Goals Reduce the pain; return to 30 minutes walking program at the [facility]    Currently in Pain? Yes   Pain Score 4    Pain Location Back   Pain Orientation Right   Pain Descriptors / Indicators Aching;Dull   Pain Type Acute pain   Pain Radiating Towards Radiates across the Rt low back and to the Rt. hip   Pain Onset 1 to 4 weeks ago   Pain Frequency Intermittent   Aggravating Factors  Lying down on a hard surface, walking, standing, going up/down steps   Pain Relieving Factors Applying pressure to the back, medication, rest    Effect of Pain on Daily Activities Increased pain with negotiating steps, not able to do exercise program             [facility] PT Assessment - 11/16/14 0001    Assessment   Medical Diagnosis M54.5 - Low back pain   Onset Date/Surgical Date 10/19/14   Next MD Visit May 2017   Prior Therapy Cannot recall when but has done it previously   Precautions   Precautions None   Restrictions   Weight Bearing Restrictions No   Balance Screen   Has the patient fallen in the past  6 months No   Has the patient had a decrease in activity level because of a fear of falling?  No   Is the patient reluctant to leave their home because of a fear of falling?  No   Home Environment   Living Environment Private residence   Living Arrangements Alone   Type of Home Apartment   Prior Function   Level of Independence Independent   Cognition   Overall Cognitive Status Within Functional Limits for tasks assessed   Observation/Other Assessments   Focus on Therapeutic Outcomes (FOTO)  48% limitation   Posture/Postural Control   Posture/Postural Control Postural limitations   Postural Limitations Posterior pelvic tilt   ROM / Strength   AROM / PROM / Strength AROM;PROM;Strength   AROM   Overall AROM  Deficits   Overall AROM Comments Lumbar motion: Limited Rt rotation 50% vs Lt; Limited extension by 10% due to pain; Limited  flexion by 50%; Limited Rt. sidebend of 75% of Lt.; extension limited to 10% of full motion due to pain; Rt SB limited to 25% vs Lt   Lumbar motions limited due to increased pain    PROM   Overall PROM  Deficits   Overall PROM Comments Supine   Rt hip flexion: 100   PROM Assessment Site Hip   Right/Left Hip Right;Left   Right Hip External Rotation  35   Right Hip Internal Rotation  15   Left Hip Extension Limited 50% compared to the Rt.    Left Hip Flexion 80   Left Hip External Rotation  25   Left Hip Internal Rotation  10   Strength   Overall Strength Deficits   Overall Strength Comments Rt hip flexion: 3+/5 limited by pain; Lt. hip flexion 4+/5   All other LE motions 5/5    Right Hip   Right Hip Flexion 100   Flexibility   Soft Tissue Assessment /Muscle Length yes   Hamstrings SLR: Lt limited 50% compared to Rt.   Increased pain with lowering Lt. LE to the table   Palpation   Palpation comment Increased tenderness and stiffness at L4-L5 with P-A mob;   Increased tenderness, pain and taut band at Lt. QL    Ambulation/Gait   Ambulation/Gait Yes   Ambulation/Gait Assistance 7: Independent   Gait Comments Limited pelvic mobility with ambulation    Functional Gait  Assessment   Gait assessed  Yes                           PT Education - 11/16/14 1234    Education provided Yes   Education Details HEP: single knee to chest, low trunk rotation   Person(s) Educated Patient   Methods Explanation;Demonstration;Handout   Comprehension Verbalized understanding;Returned demonstration          PT Short Term Goals - 11/16/14 1312    PT SHORT TERM GOAL #1   Title Independent with HEP    Time 4   Period Weeks   Status New   PT SHORT TERM GOAL #2   Title Report 20% decrease in low back pain with standing during cleaning    Time 4   Period Weeks   Status New   PT SHORT TERM GOAL #3   Title Be able to walk > or = 25 minutes without increase in low back pain     Time 4   Period Weeks   Status New   PT SHORT  TERM GOAL #4   Title Report 30% decrease in low back pain with negotiating stairs    Time 4   Period Weeks   Status New           PT Long Term Goals - 11/17/14 1322    PT LONG TERM GOAL #1   Title Be independent with advanced HEP    Time 8   Period Weeks   Status New   PT LONG TERM GOAL #2   Title FOTO score decreased to < or = 36% limitation    Time 8   Period Weeks   Status New   PT LONG TERM GOAL #3   Title Return to 30 minute walking for exercise program    Time 8   Period Weeks   Status New   PT LONG TERM GOAL #4   Title Report 75% reduction in LBP with negotiating steps    Time 8   Period Weeks   Status New               Plan - 11-17-14 1304    Clinical Impression Statement 74 y.o female presents with Rt sided back pain that radiates into the Rt. buttock. Pain began 5 weeks ago and has diminished since then; pt still has difficulty due to LB pain with walking, negotiating steps, standing and transferring from sit <> supine. Decreased bil. hip ROM, lumbar sidebending/rotation/extension ROM, and increased tenderness at Lt QL indicate possible QL muscle spasm involvement. Rt. hip strength limited by pain with motion. Pt will benefit from skilled PT for improving ROM, increasing strength/endurance, flexibility exercises, postural education and pain management.   Pt will benefit from skilled therapeutic intervention in order to improve on the following deficits Decreased range of motion;Decreased endurance;Increased muscle spasms;Decreased activity tolerance;Pain;Impaired flexibility;Postural dysfunction;Decreased strength   Rehab Potential Good   PT Frequency 2x / week   PT Duration 8 weeks   PT Treatment/Interventions ADLs/Self Care Home Management;Traction;Ultrasound;Moist Heat;Therapeutic exercise;Dry needling;Energy conservation;Biofeedback;Neuromuscular re-education;Electrical Stimulation;Iontophoresis 4mg /ml  Dexamethasone;Functional mobility training;Patient/family education;Passive range of motion;Gait training   PT Next Visit Plan Soft tissue to Rt. QL, hamstring/hip flexibilty exercises; pelvic tilt in sitting/sitting on ball, stairs    PT Home Exercise Plan Provided in pt. education   Consulted and Agree with Plan of Care Patient          G-Codes - 11-17-2014 1150    Functional Assessment Tool Used FOTO: 48% limitatioin   Functional Limitation Other PT primary   Other PT Primary Current Status (G6269) At least 40 percent but less than 60 percent impaired, limited or restricted   Other PT Primary Goal Status (S8546) At least 20 percent but less than 40 percent impaired, limited or restricted       Problem List Patient Active Problem List   Diagnosis Date Noted  . Eyelid retraction 04/17/2013  . Bulging eyes 04/17/2013  . Disease of thyroid gland 10/09/2012  . Glaucoma suspect 04/17/2012  . 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 Blepharitis 03/28/2012  . 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 BP (high blood pressure) 12/20/2011  . Hypertropia 12/20/2011  . Arthritis, degenerative 12/20/2011    Reginal Lutes, SPT 11-17-2014 3:30 PM  [facility] [address], [place], [place], [address] Phone: [phone]   Fax:  [phone]

## 2014-11-18 ENCOUNTER — Ambulatory Visit: Payer: Medicare Other | Admitting: Physical Therapy

## 2014-11-18 ENCOUNTER — Encounter: Payer: Self-pay | Admitting: Physical Therapy

## 2014-11-18 DIAGNOSIS — M2560 Stiffness of unspecified joint, not elsewhere classified: Secondary | ICD-10-CM

## 2014-11-18 DIAGNOSIS — M545 Low back pain, unspecified: Secondary | ICD-10-CM

## 2014-11-18 DIAGNOSIS — M2569 Stiffness of other specified joint, not elsewhere classified: Secondary | ICD-10-CM

## 2014-11-18 DIAGNOSIS — M25651 Stiffness of right hip, not elsewhere classified: Secondary | ICD-10-CM

## 2014-11-18 NOTE — Therapy (Signed)
[facility] [address], [place], [place], [address] Phone: [phone]   Fax:  [phone]  Physical Therapy Treatment  Patient Details  Name: Melissa Davenport MRN: [id number] Date of Birth: 1940/10/11 Referring Provider:  Hulan Fess, MD  Encounter Date: 11/18/2014      PT End of Session - 11/18/14 1114    Visit Number 2   Number of Visits 10   Date for PT Re-Evaluation 01/11/15   PT Start Time 0092   PT Stop Time 1145   PT Time Calculation (min) 43 min   Activity Tolerance Patient tolerated treatment well   Behavior During Therapy [facility] for tasks assessed/performed      Past Medical History  Diagnosis Date  . Hypertension   . Diabetes mellitus without complication   . Thyroid disease   . Hyperlipidemia   . Allergy     allergic rhinitis  . Glaucoma   . Migraines   . Cataract   . Cancer     Past Surgical History  Procedure Laterality Date  . Eye surgery    . Abdominal hysterectomy    . Cataract extraction Bilateral   . Spine surgery      There were no vitals filed for this visit.  Visit Diagnosis:  Hip stiffness, right, left  Low back pain associated with a spinal disorder other than radiculopathy or spinal stenosis  Back stiffness      Subjective Assessment - 11/18/14 1105    Subjective Pt reports stabbing low back pain what radiates into Rt hip and buttock area   Limitations Sitting;Standing;Walking   Currently in Pain? Yes   Pain Score 4    Pain Location Back   Pain Orientation Right   Pain Descriptors / Indicators Aching;Dull   Pain Type Acute pain   Pain Onset 1 to 4 weeks ago   Pain Frequency Intermittent   Multiple Pain Sites No                         [facility] Adult PT Treatment/Exercise - 11/18/14 0001    Exercises   Exercises Lumbar;Knee/Hip   Lumbar Exercises: Stretches   Active Hamstring Stretch 3 reps;20 seconds  performed on stairs each leg, had  difficulties to perform oc   Single Knee to Chest Stretch 3 reps;20 seconds  each leg   Lower Trunk Rotation 3 reps;20 seconds   Pelvic Tilt --  2x10 pt needed mod tactile and verbal cues, for technique   Lumbar Exercises: Aerobic   UBE (Upper Arm Bike) L1 79min (3/3)                PT Education - 11/18/14 1145    Education provided Yes   Education Details pelvic tilt and hamstring stretch and review of existing HEP SKC & trunk rotation   Person(s) Educated Patient   Methods Explanation;Demonstration;Handout   Comprehension Verbalized understanding;Returned demonstration          PT Short Term Goals - 11/18/14 1116    PT SHORT TERM GOAL #1   Title Independent with HEP    Time 4   Period Weeks   Status On-going   PT SHORT TERM GOAL #2   Title Report 20% decrease in low back pain with standing during cleaning    Time 4   Period Weeks   Status On-going   PT SHORT TERM GOAL #3   Title Be able to walk > or = 25  minutes without increase in low back pain    Time 4   Period Weeks   Status On-going   PT SHORT TERM GOAL #4   Title Report 30% decrease in low back pain with negotiating stairs    Time 4   Period Weeks   Status On-going           PT Long Term Goals - 11/18/14 1118    PT LONG TERM GOAL #1   Title Be independent with advanced HEP    Time 8   Period Weeks   Status On-going   PT LONG TERM GOAL #2   Title FOTO score decreased to < or = 36% limitation    Time 8   Period Weeks   Status On-going   PT LONG TERM GOAL #3   Title Return to 30 minute walking for exercise program    Time 8   Period Weeks   Status On-going   PT LONG TERM GOAL #4   Title Report 75% reduction in LBP with negotiating steps    Time 8   Period Weeks   Status On-going               Plan - 11/18/14 1114    Clinical Impression Statement Pt is 74 year old female with Rt sided back pain radiating int Rt buttock. Pt still limited with functional activities as  negotiating stairs, walking transfers sit to stand.   Pt will benefit from skilled therapeutic intervention in order to improve on the following deficits Decreased range of motion;Decreased endurance;Increased muscle spasms;Decreased activity tolerance;Pain;Impaired flexibility;Postural dysfunction;Decreased strength   Rehab Potential Good   PT Frequency 2x / week   PT Duration 8 weeks   PT Treatment/Interventions ADLs/Self Care Home Management;Traction;Ultrasound;Moist Heat;Therapeutic exercise;Dry needling;Energy conservation;Biofeedback;Neuromuscular re-education;Electrical Stimulation;Iontophoresis 4mg /ml Dexamethasone;Functional mobility training;Patient/family education;Passive range of motion;Gait training   PT Next Visit Plan Soft tissue to Rt. QL, hamstring/hip flexibilty exercises; pelvic tilt in sitting/sitting on ball, stairs    Consulted and Agree with Plan of Care Patient        Problem List Patient Active Problem List   Diagnosis Date Noted  . Eyelid retraction 04/17/2013  . Bulging eyes 04/17/2013  . Disease of thyroid gland 10/09/2012  . Glaucoma suspect 04/17/2012  . 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 Blepharitis 03/28/2012  . 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 BP (high blood pressure) 12/20/2011  . Hypertropia 12/20/2011  . Arthritis, degenerative 12/20/2011    NAUMANN-HOUEGNIFIO, PTA 11/18/2014, 11:52 AM  [facility] [address] [place], [place], [address] Phone: [phone]   Fax:  [phone]

## 2014-11-18 NOTE — Patient Instructions (Signed)
  Copyright  VHI. All rights reserved.   Pelvic Tilt   Flatten back by tilting pelvis forward and backward Repeat ____ times per set. Do ____ sets per session. Do ____ sessions per day.  http://orth.exer.us/134   Copyright  VHI. All rights reserved. Knee to Chest (Flexion)   Pull knee toward chest. Feel stretch in lower back or buttock area. Breathing deeply, Hold _20___ seconds. Repeat with other knee. Repeat ___3_ times. Do _3___ sessions per day.  http://gt2.exer.us/225   Copyright  VHI. All rights reserved.   Lower Trunk Rotation Stretch   Keeping back flat and feet together, rotate knees to left side. Hold _20___ seconds. Repeat 3____ times per set. Do _1___ sets per session. Do 3____ sessions per day.  http://orth.exer.us/122   Copyright  VHI. All rights reserved.  Supine: Leg Stretch With Strap (Basic)     Place hands in small of back. Using hands as fulcrum, arch backward. Try to keep knees straight. Great exercise if sitting makes pain worse. Use to break up long periods of sitting. Repeat 3____ times. Do 3____ sessions per day.  http://gt2.exer.us/247    Copyright  VHI. All rights reserved.

## 2014-11-23 ENCOUNTER — Ambulatory Visit: Payer: Medicare Other | Admitting: Physical Therapy

## 2014-11-23 ENCOUNTER — Encounter: Payer: Self-pay | Admitting: Physical Therapy

## 2014-11-23 DIAGNOSIS — M2569 Stiffness of other specified joint, not elsewhere classified: Secondary | ICD-10-CM

## 2014-11-23 DIAGNOSIS — M545 Low back pain, unspecified: Secondary | ICD-10-CM

## 2014-11-23 DIAGNOSIS — M25651 Stiffness of right hip, not elsewhere classified: Secondary | ICD-10-CM | POA: Diagnosis not present

## 2014-11-23 DIAGNOSIS — M2560 Stiffness of unspecified joint, not elsewhere classified: Secondary | ICD-10-CM

## 2014-11-23 NOTE — Therapy (Signed)
[facility] [address], [place], [place], [address] Phone: [phone]   Fax:  [phone]  Physical Therapy Treatment  Patient Details  Name: Melissa Davenport MRN: [id number] Date of Birth: 12/27/1940 Referring Provider:  Hulan Fess, MD  Encounter Date: 11/23/2014      PT End of Session - 11/23/14 1255    Visit Number 3   Number of Visits 10   Date for PT Re-Evaluation 01/11/15   PT Start Time 1230   PT Stop Time 1315   PT Time Calculation (min) 45 min   Activity Tolerance Patient tolerated treatment well   Behavior During Therapy [facility] for tasks assessed/performed      Past Medical History  Diagnosis Date  . Hypertension   . Diabetes mellitus without complication   . Thyroid disease   . Hyperlipidemia   . Allergy     allergic rhinitis  . Glaucoma   . Migraines   . Cataract   . Cancer     Past Surgical History  Procedure Laterality Date  . Eye surgery    . Abdominal hysterectomy    . Cataract extraction Bilateral   . Spine surgery      There were no vitals filed for this visit.  Visit Diagnosis:  Hip stiffness, right, left  Low back pain associated with a spinal disorder other than radiculopathy or spinal stenosis  Back stiffness      Subjective Assessment - 11/23/14 1236    Subjective Had a bad night. Every time she tried to turn it hurt. Continues with pain currently.    Currently in Pain? Yes   Pain Score 6    Pain Location Back   Pain Orientation Right;Lower   Aggravating Factors  Laying or standing   Pain Relieving Factors Meds   Multiple Pain Sites No                         [facility] Adult PT Treatment/Exercise - 11/23/14 0001    Lumbar Exercises: Stretches   Active Hamstring Stretch 3 reps;20 seconds  , then stoped after second rep due to cramping   Single Knee to Chest Stretch 3 reps;20 seconds  each leg with towel.    Lower Trunk Rotation 3 reps;20 seconds   Rocking initially getting into the static stretch   Pelvic Tilt --  5 sec hold 10 x 2   Lumbar Exercises: Supine   Ab Set 20 reps;3 seconds  Ball bt knees, TA contraction with breath.   Moist Heat Therapy   Number Minutes Moist Heat --  During treatment in supine   Moist Heat Location Lumbar Spine                  PT Short Term Goals - 11/23/14 1258    PT SHORT TERM GOAL #1   Title Independent with HEP    Time 4   Period Weeks   Status Achieved   PT SHORT TERM GOAL #2   Title Report 20% decrease in low back pain with standing during cleaning    Time 4   Period Weeks   Status On-going  Too soon.    PT SHORT TERM GOAL #3   Title Be able to walk > or = 25 minutes without increase in low back pain    Time 4   Period Weeks   Status On-going  not much change yet.   PT SHORT TERM GOAL #4  Title Report 30% decrease in low back pain with negotiating stairs    Time 4   Period Weeks   Status On-going  Sharp pains abolished, but still some pain.           PT Long Term Goals - 11/18/14 1118    PT LONG TERM GOAL #1   Title Be independent with advanced HEP    Time 8   Period Weeks   Status On-going   PT LONG TERM GOAL #2   Title FOTO score decreased to < or = 36% limitation    Time 8   Period Weeks   Status On-going   PT LONG TERM GOAL #3   Title Return to 30 minute walking for exercise program    Time 8   Period Weeks   Status On-going   PT LONG TERM GOAL #4   Title Report 75% reduction in LBP with negotiating steps    Time 8   Period Weeks   Status On-going               Plan - 11/23/14 1256    Clinical Impression Statement Pt moving slow today due to pain and lack of sleep last night from pain. Worked on gentle exercises which seemed to fit pt's stauts today. Doing her HEP as evidence as her pelvic tilts improving.    Pt will benefit from skilled therapeutic intervention in order to improve on the following deficits Decreased range of  motion;Decreased endurance;Increased muscle spasms;Decreased activity tolerance;Pain;Impaired flexibility;Postural dysfunction;Decreased strength   Rehab Potential Good   PT Frequency 2x / week   PT Duration 8 weeks   PT Treatment/Interventions ADLs/Self Care Home Management;Traction;Ultrasound;Moist Heat;Therapeutic exercise;Dry needling;Energy conservation;Biofeedback;Neuromuscular re-education;Electrical Stimulation;Iontophoresis 4mg /ml Dexamethasone;Functional mobility training;Patient/family education;Passive range of motion;Gait training   PT Next Visit Plan Flexibility, core stability, modalities if helpful. Pt needs extra towel s on her heat.    Consulted and Agree with Plan of Care Patient        Problem List Patient Active Problem List   Diagnosis Date Noted  . Eyelid retraction 04/17/2013  . Bulging eyes 04/17/2013  . Disease of thyroid gland 10/09/2012  . Glaucoma suspect 04/17/2012  . 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 Blepharitis 03/28/2012  . 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 BP (high blood pressure) 12/20/2011  . Hypertropia 12/20/2011  . Arthritis, degenerative 12/20/2011    ,, PTA 11/23/2014, 1:06 PM  [facility] [address], [place], [place], [address] Phone: [phone]   Fax:  [phone]

## 2014-11-25 ENCOUNTER — Ambulatory Visit: Payer: Medicare Other | Admitting: Physical Therapy

## 2014-11-25 ENCOUNTER — Encounter: Payer: Self-pay | Admitting: Physical Therapy

## 2014-11-25 DIAGNOSIS — M545 Low back pain, unspecified: Secondary | ICD-10-CM

## 2014-11-25 DIAGNOSIS — M25651 Stiffness of right hip, not elsewhere classified: Secondary | ICD-10-CM | POA: Diagnosis not present

## 2014-11-25 DIAGNOSIS — M2560 Stiffness of unspecified joint, not elsewhere classified: Secondary | ICD-10-CM

## 2014-11-25 DIAGNOSIS — M2569 Stiffness of other specified joint, not elsewhere classified: Secondary | ICD-10-CM

## 2014-11-25 NOTE — Therapy (Signed)
[facility] [address] [place], [place], [address] Phone: [phone]   Fax:  [phone]  Physical Therapy Treatment  Patient Details  Name: Melissa Davenport MRN: [id number] Date of Birth: 1940/09/21 Referring Provider:  Hulan Fess, MD  Encounter Date: 11/25/2014      PT End of Session - 11/25/14 1736    Visit Number 4   Number of Visits 10   Date for PT Re-Evaluation 01/11/15   PT Start Time 6010   PT Stop Time 1230   PT Time Calculation (min) 46 min   Activity Tolerance Patient tolerated treatment well   Behavior During Therapy [facility] for tasks assessed/performed      Past Medical History  Diagnosis Date  . Hypertension   . Diabetes mellitus without complication   . Thyroid disease   . Hyperlipidemia   . Allergy     allergic rhinitis  . Glaucoma   . Migraines   . Cataract   . Cancer     Past Surgical History  Procedure Laterality Date  . Eye surgery    . Abdominal hysterectomy    . Cataract extraction Bilateral   . Spine surgery      There were no vitals filed for this visit.  Visit Diagnosis:  Hip stiffness, right, left  Low back pain associated with a spinal disorder other than radiculopathy or spinal stenosis  Back stiffness      Subjective Assessment - 11/25/14 1727    Subjective Turning in bed or any transfer in changing position is painful, proper bodymechanics help to control it a little bit   Limitations Sitting;Standing;Walking   How long can you sit comfortably? 10 minutes with a pillow behind her in a sturdy chair    How long can you stand comfortably? 15 - 20 minutes    How long can you walk comfortably? 15 minutes; but has not noticed tried walking frequently   Patient Stated Goals Reduce the pain; return to 30 minutes walking program at the [facility]    Currently in Pain? Yes   Pain Score 7    Pain Location Back   Pain Orientation Right;Lower   Pain Descriptors / Indicators  Aching;Dull   Pain Type Acute pain   Pain Onset More than a month ago   Pain Frequency Intermittent   Multiple Pain Sites No                         [facility] Adult PT Treatment/Exercise - 11/25/14 0001    Lumbar Exercises: Stretches   Active Hamstring Stretch 3 reps;20 seconds  able to tolerate today   Single Knee to Chest Stretch 3 reps;20 seconds   Lower Trunk Rotation 3 reps;20 seconds   Pelvic Tilt 5 reps   Lumbar Exercises: Supine   Ab Set 20 reps;3 seconds   Moist Heat Therapy   Number Minutes Moist Heat --  during treatment in supine   Moist Heat Location Lumbar Spine                  PT Short Term Goals - 11/23/14 1258    PT SHORT TERM GOAL #1   Title Independent with HEP    Time 4   Period Weeks   Status Achieved   PT SHORT TERM GOAL #2   Title Report 20% decrease in low back pain with standing during cleaning    Time 4   Period Weeks   Status  On-going  Too soon.    PT SHORT TERM GOAL #3   Title Be able to walk > or = 25 minutes without increase in low back pain    Time 4   Period Weeks   Status On-going  not much change yet.   PT SHORT TERM GOAL #4   Title Report 30% decrease in low back pain with negotiating stairs    Time 4   Period Weeks   Status On-going  Sharp pains abolished, but still some pain.           PT Long Term Goals - 11/18/14 1118    PT LONG TERM GOAL #1   Title Be independent with advanced HEP    Time 8   Period Weeks   Status On-going   PT LONG TERM GOAL #2   Title FOTO score decreased to < or = 36% limitation    Time 8   Period Weeks   Status On-going   PT LONG TERM GOAL #3   Title Return to 30 minute walking for exercise program    Time 8   Period Weeks   Status On-going   PT LONG TERM GOAL #4   Title Report 75% reduction in LBP with negotiating steps    Time 8   Period Weeks   Status On-going               Plan - 11/25/14 1737    Clinical Impression Statement Pt still very  limited due to low back pain continued with gentle exercises today.    Pt will benefit from skilled therapeutic intervention in order to improve on the following deficits Decreased range of motion;Decreased endurance;Increased muscle spasms;Decreased activity tolerance;Pain;Impaired flexibility;Postural dysfunction;Decreased strength   Rehab Potential Good   PT Frequency 2x / week   PT Duration 8 weeks   PT Treatment/Interventions ADLs/Self Care Home Management;Traction;Ultrasound;Moist Heat;Therapeutic exercise;Dry needling;Energy conservation;Biofeedback;Neuromuscular re-education;Electrical Stimulation;Iontophoresis 4mg /ml Dexamethasone;Functional mobility training;Patient/family education;Passive range of motion;Gait training   PT Next Visit Plan Flexibility, core stability, modalities if helpful. Pt needs extra towel s on her heat.    PT Home Exercise Plan current HEP   Consulted and Agree with Plan of Care Patient        Problem List Patient Active Problem List   Diagnosis Date Noted  . Eyelid retraction 04/17/2013  . Bulging eyes 04/17/2013  . Disease of thyroid gland 10/09/2012  . Glaucoma suspect 04/17/2012  . 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 Blepharitis 03/28/2012  . 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 BP (high blood pressure) 12/20/2011  . Hypertropia 12/20/2011  . Arthritis, degenerative 12/20/2011    NAUMANN-HOUEGNIFIO, PTA 11/25/2014, 5:38 PM  [facility] [address], [place], [place], [address] Phone: [phone]   Fax:  [phone]

## 2014-11-30 ENCOUNTER — Ambulatory Visit: Payer: Medicare Other

## 2014-11-30 DIAGNOSIS — M545 Low back pain, unspecified: Secondary | ICD-10-CM

## 2014-11-30 DIAGNOSIS — M2560 Stiffness of unspecified joint, not elsewhere classified: Secondary | ICD-10-CM

## 2014-11-30 DIAGNOSIS — M25651 Stiffness of right hip, not elsewhere classified: Secondary | ICD-10-CM | POA: Diagnosis not present

## 2014-11-30 DIAGNOSIS — M2569 Stiffness of other specified joint, not elsewhere classified: Secondary | ICD-10-CM

## 2014-11-30 NOTE — Therapy (Signed)
[facility] [address], [place], [place], [address] Phone: [phone]   Fax:  [phone]  Physical Therapy Treatment  Patient Details  Name: Melissa Davenport MRN: [id number] Date of Birth: [birth year] Referring Provider:  Hulan Fess, MD  Encounter Date: 11/30/2014      PT End of Session - 11/30/14 1014    Visit Number 5   Number of Visits 10   Date for PT Re-Evaluation 01/11/15   PT Start Time 1012   PT Stop Time 1102   PT Time Calculation (min) 50 min   Activity Tolerance Patient tolerated treatment well   Behavior During Therapy [facility] for tasks assessed/performed      Past Medical History  Diagnosis Date  . Hypertension   . Diabetes mellitus without complication   . Thyroid disease   . Hyperlipidemia   . Allergy     allergic rhinitis  . Glaucoma   . Migraines   . Cataract   . Cancer     Past Surgical History  Procedure Laterality Date  . Eye surgery    . Abdominal hysterectomy    . Cataract extraction Bilateral   . Spine surgery      There were no vitals filed for this visit.  Visit Diagnosis:  Hip stiffness, right, left  Back stiffness  Low back pain associated with a spinal disorder other than radiculopathy or spinal stenosis      Subjective Assessment - 11/30/14 1014    Subjective Back is feeling a little bit better    [place] long can you sit comfortably? No problems with sitting    [place] long can you stand comfortably? After 30 minutes    [place] long can you walk comfortably? 15 minutes    Patient Stated Goals Reduce the pain; return to 30 minutes walking program at the [facility]    Currently in Pain? No/denies                         [facility] Adult PT Treatment/Exercise - 11/30/14 0001    Lumbar Exercises: Aerobic   Stationary Bike L1; 6 minutes   UBE (Upper Arm Bike) L0 54mn (3/3)  green theraball    Lumbar Exercises: Standing   Other Standing Lumbar Exercises standing  sidebend stretch x1   unable to complete due to pain    Lumbar Exercises: Seated   Other Seated Lumbar Exercises horizontal abduction with red theraband; 3x10   Lumbar Exercises: Supine   Bridge Limitations  1x; unable to complete due to pain   Other Supine Lumbar Exercises Abduction with theraband; 3x10  red theraband    Manual Therapy   Manual Therapy Myofascial release   Manual therapy comments Rt. QL from iliac crest to rib; long leg distraction x 10 seconds                  PT Short Term Goals - 11/30/14 1017    PT SHORT TERM GOAL #1   Title Independent with HEP    Time 4   Period Weeks   Status On-going   PT SHORT TERM GOAL #2   Title Report 20% decrease in low back pain with standing during cleaning   Has to take a break after 30 minutes of cleaning; reports 20% improvement    Time 4   Period Weeks   Status Achieved   PT SHORT TERM GOAL #3   Title Be able to walk > or =  25 minutes without increase in low back pain   Has not tried walking for exercise recently; able to walk for 30 minutes with grocery cart    Time 4   Period Weeks   Status On-going   PT SHORT TERM GOAL #4   Title Report 30% decrease in low back pain with negotiating stairs   Reports 70% improvement in LBP better with ascending stairs    Time 4   Period Weeks   Status Achieved           PT Long Term Goals - 11/18/14 1118    PT LONG TERM GOAL #1   Title Be independent with advanced HEP    Time 8   Period Weeks   Status On-going   PT LONG TERM GOAL #2   Title FOTO score decreased to < or = 36% limitation    Time 8   Period Weeks   Status On-going   PT LONG TERM GOAL #3   Title Return to 30 minute walking for exercise program    Time 8   Period Weeks   Status On-going   PT LONG TERM GOAL #4   Title Report 75% reduction in LBP with negotiating steps    Time 8   Period Weeks   Status On-going               Plan - 11/30/14 1110    Clinical Impression Statement No  pain reported in back today; has seen improvements with ascending stairs and with decreased pain with cleaning activities. Able to perform seated postural and back strengthening exercises today with no discomfort. Pain increased with direct QL stretch; soft tissue to alleviate rt. QL tightness for pain relief. Will benefit from continued skilled therapy for strengthening and flexbility exercises for return to ADLs.      Pt will benefit from skilled therapeutic intervention in order to improve on the following deficits Decreased range of motion;Decreased endurance;Increased muscle spasms;Decreased activity tolerance;Pain;Impaired flexibility;Postural dysfunction;Decreased strength   Rehab Potential Good   PT Frequency 2x / week   PT Duration 8 weeks   PT Treatment/Interventions ADLs/Self Care Home Management;Traction;Ultrasound;Moist Heat;Therapeutic exercise;Dry needling;Energy conservation;Biofeedback;Neuromuscular re-education;Electrical Stimulation;Iontophoresis 57m/ml Dexamethasone;Functional mobility training;Patient/family education;Passive range of motion;Gait training   PT Next Visit Plan Try quadratus lumoburm MET treatment, lateral child pose;  continue with glute/core strengthening, continue with manual, standing glute exercises    PT Home Exercise Plan current HEP   Consulted and Agree with Plan of Care Patient        Problem List Patient Active Problem List   Diagnosis Date Noted  . Eyelid retraction 04/17/2013  . Bulging eyes 04/17/2013  . Disease of thyroid gland 10/09/2012  . Glaucoma suspect 04/17/2012  . 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 Blepharitis 03/28/2012  . 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 BP (high blood pressure) 12/20/2011  . Hypertropia 12/20/2011  . Arthritis, degenerative 12/20/2011   MReginal Lutes SPT 11/30/2014 11:41 AM  During this  treatment session, the therapist was present, participating in, and directing the treatment. TAKACS,KELLY 11/30/2014, 11:41 AM  [facility] [address] [place] [address]: [phone]  Fax:  [phone]

## 2014-12-02 ENCOUNTER — Ambulatory Visit: Payer: Medicare Other

## 2014-12-02 DIAGNOSIS — M25651 Stiffness of right hip, not elsewhere classified: Secondary | ICD-10-CM | POA: Diagnosis not present

## 2014-12-02 DIAGNOSIS — M2560 Stiffness of unspecified joint, not elsewhere classified: Secondary | ICD-10-CM

## 2014-12-02 DIAGNOSIS — M2569 Stiffness of other specified joint, not elsewhere classified: Secondary | ICD-10-CM

## 2014-12-02 DIAGNOSIS — M545 Low back pain, unspecified: Secondary | ICD-10-CM

## 2014-12-02 NOTE — Therapy (Signed)
[facility] [address] [place], [place], [address] Phone: [phone]   Fax:  [phone]  Physical Therapy Treatment  Patient Details  Name: Melissa Davenport MRN: [id number] Date of Birth: 1940-07-01 Referring Provider:  Hulan Fess, MD  Encounter Date: 12/02/2014      PT End of Session - 12/02/14 1351    Visit Number 6   Number of Visits 10   Date for PT Re-Evaluation 01/11/15   PT Start Time 1011   PT Stop Time 1117   PT Time Calculation (min) 66 min   Activity Tolerance Patient tolerated treatment well   Behavior During Therapy [facility] for tasks assessed/performed      Past Medical History  Diagnosis Date  . Hypertension   . Diabetes mellitus without complication   . Thyroid disease   . Hyperlipidemia   . Allergy     allergic rhinitis  . Glaucoma   . Migraines   . Cataract   . Cancer     Past Surgical History  Procedure Laterality Date  . Eye surgery    . Abdominal hysterectomy    . Cataract extraction Bilateral   . Spine surgery      There were no vitals filed for this visit.  Visit Diagnosis:  Hip stiffness, right, left  Back stiffness  Low back pain associated with a spinal disorder other than radiculopathy or spinal stenosis      Subjective Assessment - 12/02/14 1018    Subjective Back is feeling especially sore after doing massage. Increased pain with turning in bed.    How long can you stand comfortably? After 30 minutes    How long can you walk comfortably? 15 minutes    Patient Stated Goals Reduce the pain; return to 30 minutes walking program at the [facility]    Currently in Pain? Yes   Pain Score 5    Pain Location Back   Pain Orientation Right   Pain Descriptors / Indicators Sharp   Pain Onset More than a month ago   Pain Frequency Intermittent   Aggravating Factors  Changing position, walking    Pain Relieving Factors Meds                          [facility]  Adult PT Treatment/Exercise - 12/02/14 0001    Lumbar Exercises: Aerobic   UBE (Upper Arm Bike) L0 80mn (3/3)  green theraball    Lumbar Exercises: Supine   Ab Set 10 reps  10 reps with legs abducted    Other Supine Lumbar Exercises Abduction with theraband; 3x10  red theraband    Other Supine Lumbar Exercises D2 x 2 with core stabilization   20 reps    Lumbar Exercises: Quadruped   Other Quadruped Lumbar Exercises childs pose to the Lt  Tactile cues at hips for increased stretched    Modalities   Modalities Electrical Stimulation   Moist Heat Therapy   Number Minutes Moist Heat 15 Minutes   Moist Heat Location Lumbar Spine   Electrical Stimulation   Electrical Stimulation Location Rt QL    Electrical Stimulation Action Pain relief   Electrical Stimulation Parameters IFC   Electrical Stimulation Goals Pain   Manual Therapy   Manual Therapy Soft tissue mobilization   Manual therapy comments Trigger point to Rt. QL at iliac crest insertion  MET treatment in SL for QL with long leg distraction  PT Short Term Goals - 11/30/14 1017    PT SHORT TERM GOAL #1   Title Independent with HEP    Time 4   Period Weeks   Status On-going   PT SHORT TERM GOAL #2   Title Report 20% decrease in low back pain with standing during cleaning   Has to take a break after 30 minutes of cleaning; reports 20% improvement    Time 4   Period Weeks   Status Achieved   PT SHORT TERM GOAL #3   Title Be able to walk > or = 25 minutes without increase in low back pain   Has not tried walking for exercise recently; able to walk for 30 minutes with grocery cart    Time 4   Period Weeks   Status On-going   PT SHORT TERM GOAL #4   Title Report 30% decrease in low back pain with negotiating stairs   Reports 70% improvement in LBP better with ascending stairs    Time 4   Period Weeks   Status Achieved           PT Long Term Goals - 11/18/14 1118    PT LONG TERM GOAL #1    Title Be independent with advanced HEP    Time 8   Period Weeks   Status On-going   PT LONG TERM GOAL #2   Title FOTO score decreased to < or = 36% limitation    Time 8   Period Weeks   Status On-going   PT LONG TERM GOAL #3   Title Return to 30 minute walking for exercise program    Time 8   Period Weeks   Status On-going   PT LONG TERM GOAL #4   Title Report 75% reduction in LBP with negotiating steps    Time 8   Period Weeks   Status On-going               Plan - 12/02/14 1352    Clinical Impression Statement Increased irritability of low back pain after soft tissue work on Monday; tolerated supine exercises. Pt could not tolerate standing exercises requiring lumbar SB. Trigger point to Rt. QL continued to irritate pain; e-stim and heat decreased pain and tension. Pt will benefit from skilled PT for continued core stabilization and pain relief.     Pt will benefit from skilled therapeutic intervention in order to improve on the following deficits Decreased range of motion;Decreased endurance;Increased muscle spasms;Decreased activity tolerance;Pain;Impaired flexibility;Postural dysfunction;Decreased strength   Rehab Potential Good   PT Frequency 2x / week   PT Duration 8 weeks   PT Treatment/Interventions ADLs/Self Care Home Management;Traction;Ultrasound;Moist Heat;Therapeutic exercise;Dry needling;Energy conservation;Biofeedback;Neuromuscular re-education;Electrical Stimulation;Iontophoresis 4mg /ml Dexamethasone;Functional mobility training;Patient/family education;Passive range of motion;Gait training   PT Next Visit Plan Assess response to trigger point/MET treatment and e-stim/heat; continue with core stabilization exercises in supine; try seated horizontal abduction on sit fit, add core strengthening to HEP    PT Home Exercise Plan --   Consulted and Agree with Plan of Care Patient        Problem List Patient Active Problem List   Diagnosis Date Noted  .  Eyelid retraction 04/17/2013  . Bulging eyes 04/17/2013  . Disease of thyroid gland 10/09/2012  . Glaucoma suspect 04/17/2012  . 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 Blepharitis 03/28/2012  . 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 BP (  high blood pressure) 12/20/2011  . Hypertropia 12/20/2011  . Arthritis, degenerative 12/20/2011   Reginal Lutes, SPT 12/02/2014 2:20 PM   During this treatment session, the therapist was present, participating in, and directing the treatment. TAKACS,KELLY, PT 12/02/2014, 2:20 PM  [facility] [address], [place], [place], [address] Phone: [phone]   Fax:  [phone]

## 2014-12-08 ENCOUNTER — Ambulatory Visit: Payer: Medicare Other | Attending: Family Medicine

## 2014-12-08 DIAGNOSIS — M545 Low back pain, unspecified: Secondary | ICD-10-CM

## 2014-12-08 DIAGNOSIS — M25651 Stiffness of right hip, not elsewhere classified: Secondary | ICD-10-CM | POA: Insufficient documentation

## 2014-12-08 DIAGNOSIS — M2569 Stiffness of other specified joint, not elsewhere classified: Secondary | ICD-10-CM

## 2014-12-08 DIAGNOSIS — M2560 Stiffness of unspecified joint, not elsewhere classified: Secondary | ICD-10-CM | POA: Insufficient documentation

## 2014-12-08 NOTE — Therapy (Signed)
[facility] [address] [place], [place], [address] Phone: [phone]   Fax:  [phone]  Physical Therapy Treatment  Patient Details  Name: Melissa Davenport MRN: [id number] Date of Birth: 1940-06-13 Referring Provider:  Hulan Fess, MD  Encounter Date: 12/08/2014      PT End of Session - 12/08/14 1103    Visit Number 7   Number of Visits 10   Date for PT Re-Evaluation 01/11/15   PT Start Time 1013   PT Stop Time 1112   PT Time Calculation (min) 59 min   Activity Tolerance Patient tolerated treatment well   Behavior During Therapy [facility] for tasks assessed/performed      Past Medical History  Diagnosis Date  . Hypertension   . Diabetes mellitus without complication   . Thyroid disease   . Hyperlipidemia   . Allergy     allergic rhinitis  . Glaucoma   . Migraines   . Cataract   . Cancer     Past Surgical History  Procedure Laterality Date  . Eye surgery    . Abdominal hysterectomy    . Cataract extraction Bilateral   . Spine surgery      There were no vitals filed for this visit.  Visit Diagnosis:  Low back pain associated with a spinal disorder other than radiculopathy or spinal stenosis  Back stiffness      Subjective Assessment - 12/08/14 1018    Subjective Back is feeling especially sore after last session; pain continues after 24 hours    Patient Stated Goals Reduce the pain; return to 30 minutes walking program at the [facility]    Currently in Pain? Yes   Pain Score 5    Pain Location Back   Pain Orientation Right;Left   Pain Descriptors / Indicators Sharp   Pain Type Acute pain   Pain Radiating Towards Radiates along Rt. hip   Pain Onset More than a month ago   Pain Frequency Intermittent   Aggravating Factors  Side bending position, walking after a long distance (15 minutes)    Pain Relieving Factors Meds    Multiple Pain Sites No            [facility] PT Assessment - 12/08/14 0001     Precautions   Precautions Other (comment)  Hx of CA                     [facility] Adult PT Treatment/Exercise - 12/08/14 0001    Lumbar Exercises: Aerobic   UBE (Upper Arm Bike) L0 70min (3/3)  green theraball    Lumbar Exercises: Machines for Strengthening   Other Lumbar Machine Exercise walking with sports cord; 20# 8x forward/backward   Lumbar Exercises: Standing   Other Standing Lumbar Exercises standing side bend at mat for QL  3 sets for 30 seconds each side   Other Standing Lumbar Exercises standing QL stretch with red ball; 3 sets x 30 seconds each side   Lumbar Exercises: Seated   Other Seated Lumbar Exercises horizontal abduction with yellow theraband; 3x10  sitting on grey sit fit    Other Seated Lumbar Exercises D2 with yellow theraband; 2x10   sitting on grey sit fit    Moist Heat Therapy   Number Minutes Moist Heat 15 Minutes   Moist Heat Location Lumbar Spine   Electrical Stimulation   Electrical Stimulation Location Rt QL    Electrical Stimulation Action Pain relief    Electrical  Stimulation Parameters IFC    Electrical Stimulation Goals Pain                  PT Short Term Goals - 12/08/14 1017    PT SHORT TERM GOAL #1   Title Independent with HEP    Time 4   Period Weeks   Status On-going   PT SHORT TERM GOAL #3   Title Be able to walk > or = 25 minutes without increase in low back pain   Continues to feel LBP after about 15- 20 minutes of walking   Time 4   Period Weeks   Status On-going           PT Long Term Goals - 12/08/14 1322    PT LONG TERM GOAL #3   Title Return to 30 minute walking for exercise program   Unable to return to walking due to personal circumstances; reports being able to ambulate for 20 minutes without pain    Time 8   Period Weeks   Status On-going               Plan - 12/08/14 1320    Clinical Impression Statement Continued to report increased irritability of low back pain after session  last week; performed light stretching and seated/standing core stabilization exercises. Pain did not increase over a 4/10 today; pt reported improvement after e-stim and heat session. Will continue to benefit from skilled therapy for flexiblity, core strengthening and postural training.    Pt will benefit from skilled therapeutic intervention in order to improve on the following deficits Decreased range of motion;Decreased endurance;Increased muscle spasms;Decreased activity tolerance;Pain;Impaired flexibility;Postural dysfunction;Decreased strength   Rehab Potential Good   PT Frequency 2x / week   PT Duration 8 weeks   PT Treatment/Interventions ADLs/Self Care Home Management;Traction;Moist Heat;Therapeutic exercise;Dry needling;Energy conservation;Biofeedback;Neuromuscular re-education;Electrical Stimulation;Iontophoresis 4mg /ml Dexamethasone;Functional mobility training;Patient/family education;Passive range of motion;Gait training   PT Next Visit Plan Assess response to treatment session, continue with standing/seat core exercises, add exercises to HEP    Consulted and Agree with Plan of Care Patient        Problem List Patient Active Problem List   Diagnosis Date Noted  . Eyelid retraction 04/17/2013  . Bulging eyes 04/17/2013  . Disease of thyroid gland 10/09/2012  . Glaucoma suspect 04/17/2012  . 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 Blepharitis 03/28/2012  . 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 BP (high blood pressure) 12/20/2011  . Hypertropia 12/20/2011  . Arthritis, degenerative 12/20/2011   Reginal Lutes, SPT 12/08/2014 2:01 PM   During this treatment session, the therapist was present, participating in, and directing the treatment. TAKACS,KELLY, PT 12/08/2014, 2:01 PM  [facility] [address], [place], [place], [address] Phone: [phone]   Fax:  [phone]

## 2014-12-10 ENCOUNTER — Ambulatory Visit: Payer: Medicare Other

## 2014-12-10 DIAGNOSIS — M2569 Stiffness of other specified joint, not elsewhere classified: Secondary | ICD-10-CM

## 2014-12-10 DIAGNOSIS — M545 Low back pain, unspecified: Secondary | ICD-10-CM

## 2014-12-10 DIAGNOSIS — M2560 Stiffness of unspecified joint, not elsewhere classified: Secondary | ICD-10-CM

## 2014-12-10 NOTE — Therapy (Signed)
[facility] [address] [place], [place], [address] Phone: [phone]   Fax:  [phone]  Physical Therapy Treatment  Patient Details  Name: Melissa Davenport MRN: [id number] Date of Birth: 1940/11/02 Referring Provider:  Hulan Fess, MD  Encounter Date: 12/10/2014      PT End of Session - 12/10/14 1037    Visit Number 8   Number of Visits 10   Date for PT Re-Evaluation 01/11/15   PT Start Time 1010   PT Stop Time 1112   PT Time Calculation (min) 62 min   Activity Tolerance Patient tolerated treatment well   Behavior During Therapy [facility] for tasks assessed/performed      Past Medical History  Diagnosis Date  . Hypertension   . Diabetes mellitus without complication   . Thyroid disease   . Hyperlipidemia   . Allergy     allergic rhinitis  . Glaucoma   . Migraines   . Cataract   . Cancer     Past Surgical History  Procedure Laterality Date  . Eye surgery    . Abdominal hysterectomy    . Cataract extraction Bilateral   . Spine surgery      There were no vitals filed for this visit.  Visit Diagnosis:  Low back pain associated with a spinal disorder other than radiculopathy or spinal stenosis  Back stiffness      Subjective Assessment - 12/10/14 1012    Subjective Feeling better since last time. Heat and e-stim seemed to help. Turning in bed is still painful and getting out of the car still tweaks but it continues to go away.    Patient Stated Goals Reduce the pain; return to 30 minutes walking program at the [facility]    Currently in Pain? No/denies                         [facility] Adult PT Treatment/Exercise - 12/10/14 0001    Lumbar Exercises: Aerobic   UBE (Upper Arm Bike) L0 58mn (3/3)  green theraball    Lumbar Exercises: Machines for Strengthening   Other Lumbar Machine Exercise walking with sports cord; 25# 8x forward/backward   Lumbar Exercises: Standing   Other Standing  Lumbar Exercises standing side bend at mat for QL  2 sets for 30 seconds each side   Lumbar Exercises: Seated   Other Seated Lumbar Exercises pelvic tilts on the ball/side to side/circles  1 min each    Other Seated Lumbar Exercises marching on green theraball 30 secx2  Increase LBP with activity   Lumbar Exercises: Supine   Other Supine Lumbar Exercises TA activation with abduction 2x10   Moist Heat Therapy   Number Minutes Moist Heat 15 Minutes   Moist Heat Location Lumbar Spine   Electrical Stimulation   Electrical Stimulation Location Rt QL    Electrical Stimulation Action Pain Relief    Electrical Stimulation Parameters IFC    Electrical Stimulation Goals Pain                PT Education - 12/10/14 1117    Education provided Yes   Education Details QL stretch in standing    Person(s) Educated Patient   Methods Explanation;Demonstration;Handout   Comprehension Verbalized understanding;Returned demonstration          PT Short Term Goals - 12/10/14 1015    PT SHORT TERM GOAL #3   Title Be able to walk > or = 25  minutes without increase in low back pain   Has not tried to walk for more than 25 minutes at a time;   Status On-going           PT Long Term Goals - 12/10/14 1018    PT LONG TERM GOAL #1   Title Be independent with advanced HEP    Time 8   Period Weeks   Status On-going   PT LONG TERM GOAL #2   Title FOTO score decreased to < or = 36% limitation    Time 8   Period Weeks   Status On-going   PT LONG TERM GOAL #4   Title Report 75% reduction in LBP with negotiating steps   Reports that she does not have to do many steps and has no LBP with negotiating 1-2 steps   Time 8   Period Weeks   Status Partially Met               Plan - 12/10/14 1124    Clinical Impression Statement Able to tolerate seated/standing core exercises today; some fatigue and increase in LBP with continued use of stability ball. Demonstrated decreased pain with  supine <> sit transfers; continues to feel pain with transferring in/out of car and turning in bed. Will benefit from skilled PT for continued strength, flexiblity and core/postural training.     Pt will benefit from skilled therapeutic intervention in order to improve on the following deficits Decreased range of motion;Decreased endurance;Increased muscle spasms;Decreased activity tolerance;Pain;Impaired flexibility;Postural dysfunction;Decreased strength   Rehab Potential Good   PT Frequency 2x / week   PT Duration 8 weeks   PT Treatment/Interventions ADLs/Self Care Home Management;Traction;Moist Heat;Therapeutic exercise;Dry needling;Energy conservation;Biofeedback;Neuromuscular re-education;Electrical Stimulation;Iontophoresis 22m/ml Dexamethasone;Functional mobility training;Patient/family education;Passive range of motion;Gait training   PT Next Visit Plan Continue with standing/seated exercises, add core/TA exercises to HEP    Consulted and Agree with Plan of Care Patient        Problem List Patient Active Problem List   Diagnosis Date Noted  . Eyelid retraction 04/17/2013  . Bulging eyes 04/17/2013  . Disease of thyroid gland 10/09/2012  . Glaucoma suspect 04/17/2012  . 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 Blepharitis 03/28/2012  . 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 BP (high blood pressure) 12/20/2011  . Hypertropia 12/20/2011  . Arthritis, degenerative 12/20/2011   MReginal Lutes SPT 12/10/2014 12:10 PM  During this treatment session, the therapist was present, participating in, and directing the treatment. TAKACS,KELLY, PT 12/10/2014, 12:10 PM  [facility] [address] [place] [address]: [phone]  Fax:  [phone]

## 2014-12-10 NOTE — Patient Instructions (Addendum)
  Lumbar Side-Bend: with Flexion Stretch (Kneeling)    At the counter/bed/ lean hands forward until you feel a stretch in side of your back. Hold for 30 seconds. Repeat on other side. Do 3x each side.  Copyright  VHI. All rights reserved.  Lower abdominal/core stability exercises

## 2014-12-14 ENCOUNTER — Ambulatory Visit: Payer: Medicare Other

## 2014-12-14 DIAGNOSIS — M2569 Stiffness of other specified joint, not elsewhere classified: Secondary | ICD-10-CM

## 2014-12-14 DIAGNOSIS — M2560 Stiffness of unspecified joint, not elsewhere classified: Secondary | ICD-10-CM

## 2014-12-14 DIAGNOSIS — M545 Low back pain, unspecified: Secondary | ICD-10-CM

## 2014-12-14 NOTE — Therapy (Addendum)
[facility] [address] [place], [place], [address] Phone: [phone]   Fax:  [phone]  Physical Therapy Treatment  Patient Details  Name: Melissa Davenport MRN: [id number] Date of Birth: March 31, 1941 Referring Provider:  Hulan Fess, MD  Encounter Date: 12/14/2014      PT End of Session - 12/14/14 1028    Visit Number 9   Number of Visits 10   Date for PT Re-Evaluation 01/11/15   PT Start Time 1011   PT Stop Time 1058   PT Time Calculation (min) 47 min   Activity Tolerance Patient tolerated treatment well   Behavior During Therapy [facility] for tasks assessed/performed      Past Medical History  Diagnosis Date  . Hypertension   . Diabetes mellitus without complication   . Thyroid disease   . Hyperlipidemia   . Allergy     allergic rhinitis  . Glaucoma   . Migraines   . Cataract   . Cancer     Past Surgical History  Procedure Laterality Date  . Eye surgery    . Abdominal hysterectomy    . Cataract extraction Bilateral   . Spine surgery      There were no vitals filed for this visit.  Visit Diagnosis:  Low back pain associated with a spinal disorder other than radiculopathy or spinal stenosis  Back stiffness      Subjective Assessment - 12/14/14 1012    Subjective Continues to feel sore in the rt. back area.    How long can you walk comfortably? 20 minutes    Currently in Pain? Yes   Pain Score 5    Pain Location Back   Pain Orientation Right   Pain Descriptors / Indicators Sharp   Pain Type Acute pain;Chronic pain   Pain Onset More than a month ago   Pain Frequency Intermittent   Aggravating Factors  Getting out of bed, lying down, turning in bed    Pain Relieving Factors Meds                         [facility] Adult PT Treatment/Exercise - 12/14/14 0001    Lumbar Exercises: Machines for Strengthening   Other Lumbar Machine Exercise multifidi walking exercise; 2 sets of 8   20#    Lumbar Exercises: Standing   Other Standing Lumbar Exercises standing side bend at mat for QL  2 sets for 30 seconds each side   Lumbar Exercises: Supine   Other Supine Lumbar Exercises TA activation with abduction 2x10   Other Supine Lumbar Exercises TA activation with horizontal abduction; red band; 3x10    Moist Heat Therapy   Number Minutes Moist Heat 8 Minutes   Moist Heat Location Lumbar Spine  prior to manual therapy   Manual Therapy   Manual Therapy Soft tissue mobilization   Manual therapy comments Trigger point to Rt. QL at iliac crest insertion  Soft tissue to QL from rib to iliac crest insertion      Arm Bike: Level 0 seated on green ball x 6 minutes             PT Short Term Goals - 12/14/14 1016    PT SHORT TERM GOAL #1   Title Independent with HEP   Performing QL stretches at home    Time --   Period --   Status Achieved   PT SHORT TERM GOAL #4   Title Report 30%  decrease in low back pain with negotiating stairs            PT Long Term Goals - 12/14/14 1024    PT LONG TERM GOAL #1   Title Be independent with advanced HEP   Reports performing QL stretches/lumbar stretches at home    Time 8   Period Weeks   Status On-going   PT LONG TERM GOAL #4   Title Report 75% reduction in LBP with negotiating steps   Reports 50% reduction in LBP with negotiating stairs               Plan - 12/14/14 1305    Clinical Impression Statement Tolerated lumbar/core stabilization exercises well today without pain. Performed moist heat and stretching before manual therapy to decrease tenderness to palpation; soft tissue work used to alleviate Rt. QL tightness and trigger points. Educated patient about the importance of stretching/use of heat after PT to reduce soreness. Will benefit from skilled PT for continued core/lumbar stabilization and manual therapy to QL.     Pt will benefit from skilled therapeutic intervention in order to improve on the  following deficits Decreased range of motion;Decreased endurance;Increased muscle spasms;Decreased activity tolerance;Pain;Impaired flexibility;Postural dysfunction;Decreased strength   Rehab Potential Good   PT Frequency 2x / week   PT Duration 8 weeks   PT Treatment/Interventions ADLs/Self Care Home Management;Traction;Moist Heat;Therapeutic exercise;Dry needling;Energy conservation;Biofeedback;Neuromuscular re-education;Electrical Stimulation;Iontophoresis 4mg /ml Dexamethasone;Functional mobility training;Patient/family education;Passive range of motion;Gait training   PT Next Visit Plan Review goals, do FOTO, continue with standing/sitting exercises, provide TA/core exercises to HEP    Consulted and Agree with Plan of Care Patient        Problem List Patient Active Problem List   Diagnosis Date Noted  . Eyelid retraction 04/17/2013  . Bulging eyes 04/17/2013  . Disease of thyroid gland 10/09/2012  . Glaucoma suspect 04/17/2012  . 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 Blepharitis 03/28/2012  . 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 BP (high blood pressure) 12/20/2011  . Hypertropia 12/20/2011  . Arthritis, degenerative 12/20/2011   Reginal Lutes, SPT 12/14/2014 2:13 PM  During this treatment session, the therapist was present, participating in, and directing the treatment. TAKACS,KELLY, PT 12/14/2014, 2:13 PM  [facility] [address] [place], [place], [address] Phone: [phone]   Fax:  [phone]

## 2014-12-16 ENCOUNTER — Encounter: Payer: Medicare Other | Attending: Family Medicine | Admitting: *Deleted

## 2014-12-16 ENCOUNTER — Encounter: Payer: Self-pay | Admitting: *Deleted

## 2014-12-16 VITALS — Ht 63.0 in | Wt 195.3 lb

## 2014-12-16 DIAGNOSIS — Z6834 Body mass index (BMI) 34.0-34.9, adult: Secondary | ICD-10-CM | POA: Diagnosis not present

## 2014-12-16 DIAGNOSIS — Z713 Dietary counseling and surveillance: Secondary | ICD-10-CM | POA: Insufficient documentation

## 2014-12-16 DIAGNOSIS — E119 Type 2 diabetes mellitus without complications: Secondary | ICD-10-CM | POA: Diagnosis present

## 2014-12-16 NOTE — Progress Notes (Signed)
Diabetes Self-Management Education  Visit Type:  (completed Core series)  Appt. Start Time: 1100 Appt. End Time: 1230  12/16/2014  Melissa Davenport, identified by name and date of birth, is a 74 y.o. female with a diagnosis of Diabetes: Type 2.  Other people present during visit:  Patient only. Reilly attended our intense group education series. Her sister who also has diabetes accompanied her to the classes. Sanna returns today for further clarification r/t nutrition.  ASSESSMENT  Height 5\' 3"  (1.6 m), weight 195 lb 4.8 oz (88.587 kg). Body mass index is 34.6 kg/(m^2).  Initial Visit Information:  Are you currently following a meal plan?: Yes What type of meal plan do you follow?: low carb Are you taking your medications as prescribed?: Yes Are you checking your feet?: Yes How many days per week are you checking your feet?: 1 How often do you need to have someone help you when you read instructions, pamphlets, or other written materials from your doctor or pharmacy?: 1 - Never   Psychosocial:   Patient Belief/Attitude about Diabetes: Motivated to manage diabetes Self-care barriers: None Self-management support: Doctor's office, CDE visits Other persons present: Patient Patient Concerns: Nutrition/Meal planning, Healthy Lifestyle Special Needs: None Preferred Learning Style: No preference indicated Learning Readiness: Change in progress  Complications:   Last HgB A1C per patient/outside source: 5.6 mg/dL How often do you check your blood sugar?: 1-2 times/day Fasting Blood glucose range (mg/dL): 70-129 Postprandial Blood glucose range (mg/dL): 70-129 Have you had a dilated eye exam in the past 12 months?: Yes Have you had a dental exam in the past 12 months?: No  Diet Intake:  Breakfast: oatmeal , nuts/ eggs, toast, tomatoes, coffee / cheerios Lunch: tomato sandwich (wheat bread)  Dinner: steak, baked potatoe, brocolli   Exercise:  Exercise: ADL's (in therapy for  back pain)  Individualized Plan for Diabetes Self-Management Training:   Learning Objective:  Patient will have a greater understanding of diabetes self-management. Patient education plan per assessed needs and concerns is to attend individual sessions      Education Topics Reviewed with Patient Today:    Role of diet in the treatment of diabetes and the relationship between the three main macronutrients and blood glucose level, Food label reading, portion sizes and measuring food., Carbohydrate counting, Meal options for control of blood glucose level and chronic complications. Role of exercise on diabetes management, blood pressure control and cardiac health., Identified with patient nutritional and/or medication changes necessary with exercise. Purpose and frequency of SMBG., Yearly dilated eye exam Relationship between chronic complications and blood glucose control Role of stress on diabetes (Patient under great deal of stress due to death in the family and her need to care for her ailing sister.) Lifestyle issues that need to be addressed for better diabetes care  PATIENTS GOALS/Plan (Developed by the patient):  Nutrition: General guidelines for healthy choices and portions discussed Physical Activity: Exercise 3-5 times per week Medications: take my medication as prescribed Monitoring : test my blood glucose as discussed (note x per day with comment) (Once daily at various times)   Patient Instructions  Plan:  Aim for 2-3 Carb Choices per meal (30-45 grams) +/- 1 either way  Aim for 0-15 Carbs per snack if hungry  Include protein in moderation with your meals and snacks ------- babybell light cheese Consider reading food labels for Total Carbohydrate and Fat Grams of foods Consider  increasing your activity level by walking for 30 minutes daily as tolerated  Continue checking BG at alternate times per day as directed by MD  Continue taking medication as directed by MD  [place] for [employer] making good food choices....... You just need to decrease your portion size  Crunchy Cheetos 21 pieces = 13g  Lynnae Sandhoff Whole Wheat reduced calorie bread 45 calories = 7 carbs Dannon Light & Fit Greek Yogurt Yoplait 100 Greek Yogurt  Use Olive Oil or Canola Oil  Your doing GREAT!  Keep up the good work!   Expected Outcomes:  Demonstrated interest in learning. Expect positive outcomes  Education material provided: Living Well with Diabetes, A1C conversion sheet, Meal plan card, My Plate and Support group flyer  If problems or questions, patient to contact team via:  Phone  Future DSME appointment: PRN

## 2014-12-16 NOTE — Patient Instructions (Addendum)
Plan:  Aim for 2-3 Carb Choices per meal (30-45 grams) +/- 1 either way  Aim for 0-15 Carbs per snack if hungry  Include protein in moderation with your meals and snacks ------- babybell light cheese Consider reading food labels for Total Carbohydrate and Fat Grams of foods Consider  increasing your activity level by walking for 30 minutes daily as tolerated Continue checking BG at alternate times per day as directed by MD  Continue taking medication as directed by MD  [place] for [employer] making good food choices....... You just need to decrease your portion size  Crunchy Cheetos 21 pieces = 13g  Lynnae Sandhoff Whole Wheat reduced calorie bread 45 calories = 7 carbs Dannon Light & Fit Greek Yogurt Yoplait 100 Greek Yogurt  Use Olive Oil or Canola Oil  Your doing GREAT!  Keep up the good work!

## 2014-12-17 ENCOUNTER — Ambulatory Visit: Payer: Medicare Other

## 2014-12-17 DIAGNOSIS — M545 Low back pain, unspecified: Secondary | ICD-10-CM

## 2014-12-17 DIAGNOSIS — M25651 Stiffness of right hip, not elsewhere classified: Secondary | ICD-10-CM

## 2014-12-17 DIAGNOSIS — M2560 Stiffness of unspecified joint, not elsewhere classified: Secondary | ICD-10-CM

## 2014-12-17 DIAGNOSIS — M2569 Stiffness of other specified joint, not elsewhere classified: Secondary | ICD-10-CM

## 2014-12-17 NOTE — Therapy (Signed)
[facility] [address], [place], [place], [address] Phone: [phone]   Fax:  [phone]  Physical Therapy Treatment  Patient Details  Name: Melissa Davenport MRN: [id number] Date of Birth: 07-31-1940 Referring Provider:  Hulan Fess, MD  Encounter Date: 12/17/2014      PT End of Session - 12/17/14 1247    Visit Number 10  Simultaneous filing. User may not have seen previous data.   Number of Visits 10   Date for PT Re-Evaluation 01/11/15   PT Start Time 2620   PT Stop Time 1116   PT Time Calculation (min) 61 min   Activity Tolerance Patient tolerated treatment well   Behavior During Therapy [facility] for tasks assessed/performed      Past Medical History  Diagnosis Date  . Hypertension   . Diabetes mellitus without complication   . Thyroid disease   . Hyperlipidemia   . Allergy     allergic rhinitis  . Glaucoma   . Migraines   . Cataract   . Cancer     Past Surgical History  Procedure Laterality Date  . Eye surgery    . Abdominal hysterectomy    . Cataract extraction Bilateral   . Spine surgery      There were no vitals filed for this visit.  Visit Diagnosis:  Low back pain associated with a spinal disorder other than radiculopathy or spinal stenosis  Back stiffness  Hip stiffness, right, left      Subjective Assessment - 12/17/14 1032    Subjective Still felt sore during the day after manual therapy. Does not feel like low back pain is improving. Planning to D/C today.    Patient Stated Goals Reduce the pain; return to 30 minutes walking program at the [facility]             [facility] PT Assessment - 12/17/14 0001    Assessment   Medical Diagnosis M54.5 - Low back pain   Onset Date/Surgical Date 10/19/14   Prior Function   Level of Independence Independent   Observation/Other Assessments   Focus on Therapeutic Outcomes (FOTO)  55% limitation   Strength   Overall Strength Deficits   Overall  Strength Comments Lt Hip flexion: 4+/5; Rt hip 4/5                      [facility] Adult PT Treatment/Exercise - 12/17/14 0001    Lumbar Exercises: Aerobic   UBE (Upper Arm Bike) L0 97mn (3/3)  green theraball    Lumbar Exercises: Machines for Strengthening   Other Lumbar Machine Exercise walking with sports cord; 25# 8x backward  20# going forward    Lumbar Exercises: Seated   Other Seated Lumbar Exercises horizontal abduction with yellow theraband; 3x10  sitting on grey sit fit    Lumbar Exercises: Supine   Other Supine Lumbar Exercises TA activation with abduction 2x10   Other Supine Lumbar Exercises TA activation supine; 10x   tactile/verbal cueing for appropriate motio n   Knee/Hip Exercises: Seated   Other Seated Knee/Hip Exercises seated hip abduction with red theraband   3x10   Moist Heat Therapy   Number Minutes Moist Heat 15 Minutes   Moist Heat Location Lumbar Spine   Electrical Stimulation   Electrical Stimulation Location Rt QL    Electrical Stimulation Action Pain Relief    Electrical Stimulation Parameters IFC   Electrical Stimulation Goals Pain  PT Education - 2014/12/26 1247    Education provided Yes   Education Details Provided stretches/hip abduction exercises for HEP    Person(s) Educated Patient   Methods Explanation;Demonstration;Handout   Comprehension Verbalized understanding;Returned demonstration          PT Short Term Goals - December 26, 2014 1250    PT SHORT TERM GOAL #1   Title Independent with HEP    Time 4   Period Weeks   Status Achieved   PT SHORT TERM GOAL #2   Title Report 20% decrease in low back pain with standing during cleaning    Time 4   Period Weeks   Status Achieved   PT SHORT TERM GOAL #3   Title Be able to walk > or = 25 minutes without increase in low back pain    Time 4   Period Weeks   Status On-going   PT SHORT TERM GOAL #4   Title Report 30% decrease in low back pain with negotiating  stairs   Reports 40% decrease in low back pain with negotiating stairs   Time 4   Period Weeks   Status Achieved           PT Long Term Goals - 12-26-2014 1042    PT LONG TERM GOAL #1   Title Be independent with advanced HEP   Provided patient with handout with stretches and exercises    Time 8   Period Weeks   Status Achieved  handouts printed for entire program today   PT LONG TERM GOAL #2   Title FOTO score decreased to < or = 36% limitation    Status Not Met  55% limitation today   PT LONG TERM GOAL #3   Title Return to 30 minute walking for exercise program    Status Partially Met  Not consistentlly able to do this due to pain   PT LONG TERM GOAL #4   Title Report 75% reduction in LBP with negotiating steps    Status Partially Met  50% reported               Plan - December 26, 2014 1247    Clinical Impression Statement Reported increased soreness that lasted for more than 24 hours after last manual session. Reports that generalized back pain has decreased, but continues to have flare ups intermittently that affect function. FOTO score has increased since intial evaluation; pt reports that she does not feel like she is improving with PT. D/C from PT; reviewed advanced HEP and provided VC/tactile cues for appropriate form.    PT Next Visit Plan D/C PT    Consulted and Agree with Plan of Care Patient          G-Codes - Dec 26, 2014 1042    Functional Assessment Tool Used FOTO: 55% limitation   Functional Limitation Other PT primary   Other PT Primary Goal Status (E3212) At least 40 percent but less than 60 percent impaired, limited or restricted   Other PT Primary Discharge Status (Y4825) At least 40 percent but less than 60 percent impaired, limited or restricted      Problem List Patient Active Problem List   Diagnosis Date Noted  . Eyelid retraction 04/17/2013  . Bulging eyes 04/17/2013  . Disease of thyroid gland 10/09/2012  . Glaucoma suspect 04/17/2012  .  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 Blepharitis 03/28/2012  . 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 Flajani disease  12/20/2011  . BP (high blood pressure) 12/20/2011  . Hypertropia 12/20/2011  . Arthritis, degenerative 12/20/2011   Reginal Lutes, SPT 12/17/2014 1:25 PM During this treatment session, the therapist was present, participating in, and directing the treatment.  PHYSICAL THERAPY DISCHARGE SUMMARY  Visits from Start of Care: 10  Current functional level related to goals / functional outcomes: See above for goals status.  Pt reports 30% overall improvement since the start of care.  No significant change in progress over the past 2-3 weeks.     Remaining deficits: Continued LBP that limits function.  Pt has HEP in place to address remaining deficits. Pt will follow-up with MD to discuss lack of progress.     Education / Equipment: HEP, Economist Plan: Patient agrees to discharge.  Patient goals were partially met. Patient is being discharged due to lack of progress.  ?????     TAKACS,KELLY, PT 12/17/2014, 1:25 PM  [facility] [address], [place], [place], [address] Phone: [phone]   Fax:  [phone]

## 2014-12-17 NOTE — Patient Instructions (Signed)
Lower abdominal/core stability exercises  1. Practice your breathing technique: Inhale through your nose expanding your belly and rib cage. Try not to breathe into your chest. Exhale slowly and gradually out your mouth feeling a sense of softness to your body. Practice multiple times. This can be performed unlimited.  2. Finding the lower abdominals. Laying on your back with the knees bent, place your fingers just below your belly button. Using your breathing technique from above, on your exhale gently pull the belly button away from your fingertips without tensing any other muscles. Practice this 5x. Next, as you exhale, draw belly button inwards and hold onto it...then feel as if you are pulling that muscle across your pelvis like you are tightening a belt. This can be hard to do at first so be patient and practice. Do 5-10 reps 1-3 x day. Always recognize quality over quantity; if your abdominal muscles become tired you will notice you may tighten/contract other muscles. This is the time to take a break.      4. Adding leg movements. Add the following leg movements to challenge your ability to keep your core stable:  1. Single leg drop outs: Laying on your back with knees bent feet flat. Inhale,  dropping one knee outward KEEPING YOUR PELVIS STILL. Exhale as you bring the leg back, simultaneously performing your lower abdominal contraction. Do 5-10 on each leg.        Hip abduction   While sitting with good posture, tie theraband around knees and pull apart. Do not move your feet. Do 10x for 3 sets.

## 2014-12-23 ENCOUNTER — Encounter: Payer: Medicare Other | Admitting: Physical Therapy

## 2014-12-25 ENCOUNTER — Encounter: Payer: Medicare Other | Admitting: Physical Therapy

## 2014-12-29 ENCOUNTER — Ambulatory Visit: Payer: Medicare Other

## 2015-03-05 NOTE — Progress Notes (Signed)
   Subjective:    Patient ID: Melissa Davenport, female    DOB: [birth year], 74 y.o.   MRN: [id number]  HPI    Review of Systems     Objective:   Physical Exam        Assessment & Plan:

## 2015-05-17 ENCOUNTER — Other Ambulatory Visit: Payer: Self-pay

## 2015-05-17 DIAGNOSIS — Z1231 Encounter for screening mammogram for malignant neoplasm of breast: Secondary | ICD-10-CM

## 2015-06-04 ENCOUNTER — Ambulatory Visit
Admission: RE | Admit: 2015-06-04 | Discharge: 2015-06-04 | Disposition: A | Discharge status: Home / Self Care | Payer: Medicare Other | Source: Ambulatory Visit

## 2015-06-04 DIAGNOSIS — Z1231 Encounter for screening mammogram for malignant neoplasm of breast: Secondary | ICD-10-CM

## 2015-06-06 DIAGNOSIS — I517 Cardiomegaly: Secondary | ICD-10-CM

## 2015-06-06 HISTORY — DX: Cardiomegaly: I51.7

## 2016-01-18 ENCOUNTER — Other Ambulatory Visit: Payer: Self-pay | Admitting: Cardiology

## 2016-01-18 NOTE — H&P (Signed)
Melissa Davenport  Date of visit:  01/17/2016 DOB:  08-08-1940    Age:  75 yrs. Medical record number:  [id number]     Account number:  [id number] Primary Care Provider: Hulan Fess ____________________________ CURRENT DIAGNOSES  1. Dyspnea  2. Hyperlipidemia  3. Essential hypertension  4. Hypothyroidism  5. Type 2 diabetes mellitus without complications  6. Obesity  7. Encounter for preprocedural cardiovascular examination ____________________________ ALLERGIES  Flagyl, Intolerance-dizziness  Ketek, Nausea  Levaquin, Nausea ____________________________ MEDICATIONS  1. Fish Oil 300 mg-1,000 mg capsule, TID  2. multivitamin tablet, 1 p.o. daily  3. Calcium 600 600 mg (1,500 mg) tablet, BID  4. Lumigan 0.01 % eye drops, 1 gtt affected eye qd  5. Cosopt 22.3 mg-6.8 mg/mL eye drops, BID  6. Vitamin D3 1,000 unit tablet, 1 p.o. daily  7. pravastatin 20 mg tablet, 1 p.o. daily  8. selenium 200 mcg tablet, 1 p.o. daily  9. cetirizine 10 mg tablet, 1 p.o. daily  10. losartan 50 mg-hydrochlorothiazide 12.5 mg tablet, 1 p.o. daily  11. metoprolol succinate ER 100 mg tablet,extended release 24 hr, 1/2 tab daily  12. levothyroxine 100 mcg tablet, 1 p.o. daily  13. azelastine 137 mcg (0.1 %) nasal spray aerosol, bid  14. ibuprofen 800 mg tablet, PRN  15. Systane (propylene glycol) 0.4 %-0.3 % eye drops, 1 gtt into each eye as needed ____________________________ CHIEF COMPLAINTS  preop eval ____________________________ HISTORY OF PRESENT ILLNESS The patient is seen for preoperative cardiac evaluation. She has a Prior history of hypertension and prediabetes as well as hyperlipidemia and obesity. A year ago she had a prolonged episode of chest pain and had a myocardial perfusion scan showing an ejection of 72% with no evidence of ischemia. She recently developed rotator cuff problems and was having rotator cuff surgery. During the induction of anesthesia she was having an axillary block and   developed respiratory distress hypotension during surgery and was pale and looked bad. She had some sort of discomfort in her shoulder region and the surgery was canceled. I was asked to see her. Following that she developed GI upset and diarrhea slowly returned to normal. She did not really have any chest pain during the episode and her dyspnea improved somewhat over that week. She was not having PND, orthopnea or edema. She was seen in the office home the eighth she had marked anterolateral T wave inversions that were new since the previous EKG done one year ago. An echocardiogram today in the office showed concentric LVH with an ejection fraction of 60% no wall motion abnormalities. She still has limitation of motion of her right shoulder but does not have anginal. She has no edema. ____________________________ PAST HISTORY  Past Medical Illnesses:  hypertension, DM-non-insulin dependent, hyperlipidemia, hypothyroidism, kidney stones, migraine headaches, graves disease, vertigo, obesity, glaucoma;  Cardiovascular Illnesses:  no previous history of cardiac disease;  Surgical Procedures:  l ankle, l foot, hysterectomy, stent kidney, back surgery, cataract extraction OU;  NYHA Classification:  II;  Canadian Angina Classification:  Class 0: Asymptomatic;  Cardiology Procedures-Invasive:  no previous interventional or invasive cardiology procedures;  Cardiology Procedures-Noninvasive:  lexiscan Myoview June 2016, echocardiogram August 2017;  LVEF of 60% documented via echocardiogram on 01/17/2016,   ____________________________ CARDIO-PULMONARY TEST DATES EKG Date:  01/11/2016;  Nuclear Study Date:  11/12/2014;  Echocardiography Date: 01/17/2016;   ____________________________ FAMILY HISTORY Father -- Father dead, Congestive heart failure Mother -- Mother dead, Diabetes mellitus, Pacemaker in situ Sister -- Sister alive with  problem, CVA Sister -- Sister alive with problem, Diabetes mellitus, Thyroid  disease ____________________________ SOCIAL HISTORY Alcohol Use:  no alcohol use;  Smoking:  never smoked;  Diet:  low carb;  Lifestyle:  divorced;  Exercise:  no regular exercise;  Occupation:  retired, Dance movement psychotherapist;  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 Respiratory: dyspnea Cardiovascular:  please review HPI Abdominal: GI upset and frequent stoolsGenitourinary-Female: nocturia Musculoskeletal:  chronic low back pain, arthritis of the left foot, recent rotator cuff Neurological:  denies headaches, stroke, or TIA Psychiatric:  insomnia  ____________________________ PHYSICAL EXAMINATION VITAL SIGNS  Blood Pressure:  120/68 Sitting, Left arm, large cuff  , 118/60 Standing, Left arm and large cuff   Pulse:  72/min. Weight:  202.00 lbs. Height:  63"BMI: 35  Constitutional:  pleasant white female, in no acute distress, moderately obese Skin:  warm and dry to touch, no apparent skin lesions, or masses noted. Head:  normocephalic, normal hair pattern, no masses or tenderness Eyes:  EOMS Intact, PERRLA, C and S clear, Funduscopic exam not done. ENT:  ears, nose and throat reveal no gross abnormalities.  Dentition good. Neck:  supple, without massess. No JVD, thyromegaly or carotid bruits. Carotid upstroke normal. Chest:  normal symmetry, clear to auscultation. Abdomen:  abdomen soft,non-tender, no masses, no hepatospenomegaly, or aneurysm noted Peripheral Pulses:  dorsalis pedis pulse(s) 2+, posterior tibial pulse(s) diminished Extremities & Back:  tenderness and limited ROM of right shoulder,, no spinal abnormalities noted., normal muscle strength and tone. Neurological:  no gross motor or sensory deficits noted, affect appropriate, oriented  x3. ____________________________ MOST RECENT LIPID PANEL 11/12/15  CHOL TOTL 142 mg/dl, LDL 79 NM, HDL 42 mg/dl, TRIGLYCER 105 mg/dl, ALT 13 u/l, ALK PHOS 69 u/l, CHOL/HDL 3.4 (Calc) and AST 18 u/l ____________________________ IMPRESSIONS/PLAN  1. New high-risk EKG changes with significant anterolateral T wave inversions from one year ago in the setting of a clinical event to induction of anesthesia 2. Hypertensive heart disease 3. Hyperlipidemia under treatment 4. Non-insulin diabetes mellitus with glucose intolerance 5. Obesity  RECOMMENDATIONS:  The patient has a newly abnormal EKG with significant anterolateral T wave changes occurring in the setting of a clinical event. Her ejection fraction is normal. She just had a myocardial perfusion scan done about a year ago. In light of this I would recommend that we proceed with cardiac catheterization to exclude high risk LAD or left main disease in light of the EKG changes.Cardiac catheterization was discussed with the patient including risks of myocardial infarction, death, stroke, bleeding, arrhythmia, dye allergy, or renal insufficiency. She understands and is willing to proceed. Possibility of percutaneous intervention at the same setting was also discussed with the patient including risks.   ____________________________ TODAYS ORDERS  1. Complete Blood Count: Today  2. Draw PT/INR: Today  3. PTT: Today  4. Basic Metabolic Panel: Today  5. Left Heart Cath/Poss PCI: First Available                       ____________________________ Cardiology Physician:  Kerry Hough MD [facility]

## 2016-01-31 ENCOUNTER — Ambulatory Visit (HOSPITAL_COMMUNITY)
Admission: RE | Admit: 2016-01-31 | Discharge: 2016-01-31 | Disposition: A | Discharge status: Home / Self Care | Payer: Medicare Other | Source: Ambulatory Visit | Attending: Cardiovascular Disease | Admitting: Cardiovascular Disease

## 2016-01-31 ENCOUNTER — Ambulatory Visit (HOSPITAL_COMMUNITY)
Admission: RE | Disposition: A | Discharge status: Home / Self Care | Payer: Self-pay | Source: Ambulatory Visit | Attending: Cardiovascular Disease

## 2016-01-31 ENCOUNTER — Encounter (HOSPITAL_COMMUNITY): Payer: Self-pay | Admitting: *Deleted

## 2016-01-31 DIAGNOSIS — E669 Obesity, unspecified: Secondary | ICD-10-CM | POA: Insufficient documentation

## 2016-01-31 DIAGNOSIS — Z823 Family history of stroke: Secondary | ICD-10-CM | POA: Insufficient documentation

## 2016-01-31 DIAGNOSIS — I119 Hypertensive heart disease without heart failure: Secondary | ICD-10-CM | POA: Insufficient documentation

## 2016-01-31 DIAGNOSIS — E039 Hypothyroidism, unspecified: Secondary | ICD-10-CM | POA: Insufficient documentation

## 2016-01-31 DIAGNOSIS — E785 Hyperlipidemia, unspecified: Secondary | ICD-10-CM | POA: Diagnosis not present

## 2016-01-31 DIAGNOSIS — G8929 Other chronic pain: Secondary | ICD-10-CM | POA: Diagnosis not present

## 2016-01-31 DIAGNOSIS — I2510 Atherosclerotic heart disease of native coronary artery without angina pectoris: Secondary | ICD-10-CM | POA: Diagnosis not present

## 2016-01-31 DIAGNOSIS — Z833 Family history of diabetes mellitus: Secondary | ICD-10-CM | POA: Insufficient documentation

## 2016-01-31 DIAGNOSIS — R9431 Abnormal electrocardiogram [ECG] [EKG]: Secondary | ICD-10-CM

## 2016-01-31 DIAGNOSIS — M545 Low back pain: Secondary | ICD-10-CM | POA: Diagnosis not present

## 2016-01-31 DIAGNOSIS — H409 Unspecified glaucoma: Secondary | ICD-10-CM | POA: Insufficient documentation

## 2016-01-31 DIAGNOSIS — R072 Precordial pain: Secondary | ICD-10-CM

## 2016-01-31 DIAGNOSIS — G43909 Migraine, unspecified, not intractable, without status migrainosus: Secondary | ICD-10-CM | POA: Diagnosis not present

## 2016-01-31 DIAGNOSIS — E119 Type 2 diabetes mellitus without complications: Secondary | ICD-10-CM | POA: Diagnosis not present

## 2016-01-31 DIAGNOSIS — Z87442 Personal history of urinary calculi: Secondary | ICD-10-CM | POA: Diagnosis not present

## 2016-01-31 DIAGNOSIS — Z8249 Family history of ischemic heart disease and other diseases of the circulatory system: Secondary | ICD-10-CM | POA: Diagnosis not present

## 2016-01-31 DIAGNOSIS — Z6835 Body mass index (BMI) 35.0-35.9, adult: Secondary | ICD-10-CM | POA: Insufficient documentation

## 2016-01-31 DIAGNOSIS — M19072 Primary osteoarthritis, left ankle and foot: Secondary | ICD-10-CM | POA: Insufficient documentation

## 2016-01-31 HISTORY — PX: CARDIAC CATHETERIZATION: SHX172

## 2016-01-31 SURGERY — LEFT HEART CATH AND CORONARY ANGIOGRAPHY
Anesthesia: LOCAL

## 2016-01-31 MED ORDER — HEPARIN (PORCINE) IN NACL 2-0.9 UNIT/ML-% IJ SOLN
INTRAMUSCULAR | Status: AC
  Filled 2016-01-31: qty 1000

## 2016-01-31 MED ORDER — LIDOCAINE HCL (PF) 1 % IJ SOLN
INTRAMUSCULAR | Status: DC | PRN
  Administered 2016-01-31: 5 mL

## 2016-01-31 MED ORDER — SODIUM CHLORIDE 0.9% FLUSH: 3.0000 mL | INTRAVENOUS | Status: DC | PRN

## 2016-01-31 MED ORDER — SODIUM CHLORIDE 0.9 % IV SOLN: INTRAVENOUS | Status: AC

## 2016-01-31 MED ORDER — FENTANYL CITRATE (PF) 100 MCG/2ML IJ SOLN
INTRAMUSCULAR | Status: AC
  Filled 2016-01-31: qty 2

## 2016-01-31 MED ORDER — MIDAZOLAM HCL 2 MG/2ML IJ SOLN
INTRAMUSCULAR | Status: AC
  Filled 2016-01-31: qty 2

## 2016-01-31 MED ORDER — SODIUM CHLORIDE 0.9 % WEIGHT BASED INFUSION
3.0000 mL/kg/h | INTRAVENOUS | Status: DC
Start: 2016-02-01 — End: 2016-01-31
  Administered 2016-01-31: 3 mL/kg/h via INTRAVENOUS

## 2016-01-31 MED ORDER — VERAPAMIL HCL 2.5 MG/ML IV SOLN
INTRAVENOUS | Status: AC
  Filled 2016-01-31: qty 2

## 2016-01-31 MED ORDER — HEPARIN SODIUM (PORCINE) 1000 UNIT/ML IJ SOLN
INTRAMUSCULAR | Status: DC | PRN
  Administered 2016-01-31: 4500 [IU] via INTRAVENOUS

## 2016-01-31 MED ORDER — SODIUM CHLORIDE 0.9% FLUSH: 3.0000 mL | Freq: Two times a day (BID) | INTRAVENOUS | Status: DC

## 2016-01-31 MED ORDER — HEPARIN (PORCINE) IN NACL 2-0.9 UNIT/ML-% IJ SOLN
INTRAMUSCULAR | Status: DC | PRN
  Administered 2016-01-31: 1000 mL

## 2016-01-31 MED ORDER — MIDAZOLAM HCL 2 MG/2ML IJ SOLN
INTRAMUSCULAR | Status: DC | PRN
  Administered 2016-01-31: 1 mg via INTRAVENOUS

## 2016-01-31 MED ORDER — HEPARIN SODIUM (PORCINE) 1000 UNIT/ML IJ SOLN
INTRAMUSCULAR | Status: AC
  Filled 2016-01-31: qty 1

## 2016-01-31 MED ORDER — SODIUM CHLORIDE 0.9 % IV SOLN: 250.0000 mL | INTRAVENOUS | Status: DC | PRN

## 2016-01-31 MED ORDER — SODIUM CHLORIDE 0.9 % WEIGHT BASED INFUSION: 1.0000 mL/kg/h | INTRAVENOUS | Status: DC

## 2016-01-31 MED ORDER — IOPAMIDOL (ISOVUE-370) INJECTION 76%
INTRAVENOUS | Status: DC | PRN
  Administered 2016-01-31: 50 mL via INTRAVENOUS

## 2016-01-31 MED ORDER — LIDOCAINE HCL (PF) 1 % IJ SOLN
INTRAMUSCULAR | Status: AC
  Filled 2016-01-31: qty 30

## 2016-01-31 MED ORDER — ASPIRIN 81 MG PO CHEW
81.0000 mg | CHEWABLE_TABLET | ORAL | Status: AC
  Administered 2016-01-31: 81 mg via ORAL

## 2016-01-31 MED ORDER — IOPAMIDOL (ISOVUE-370) INJECTION 76%
INTRAVENOUS | Status: AC
  Filled 2016-01-31: qty 100

## 2016-01-31 MED ORDER — ASPIRIN 81 MG PO CHEW
CHEWABLE_TABLET | ORAL | Status: AC
  Administered 2016-01-31: 81 mg via ORAL
  Filled 2016-01-31: qty 1

## 2016-01-31 MED ORDER — FENTANYL CITRATE (PF) 100 MCG/2ML IJ SOLN
INTRAMUSCULAR | Status: DC | PRN
  Administered 2016-01-31: 25 ug via INTRAVENOUS

## 2016-01-31 MED ORDER — IOPAMIDOL (ISOVUE-370) INJECTION 76%
INTRAVENOUS | Status: AC
  Filled 2016-01-31: qty 50

## 2016-01-31 SURGICAL SUPPLY — 9 items
CATH INFINITI 5 FR JL3.5 (CATHETERS) ×2 IMPLANT
CATH INFINITI 5FR MULTPACK ANG (CATHETERS) ×2 IMPLANT
DEVICE RAD COMP TR BAND LRG (VASCULAR PRODUCTS) ×2 IMPLANT
GLIDESHEATH SLEND SS 6F .021 (SHEATH) ×2 IMPLANT
KIT HEART LEFT (KITS) ×2 IMPLANT
PACK CARDIAC CATHETERIZATION (CUSTOM PROCEDURE TRAY) ×2 IMPLANT
TRANSDUCER W/STOPCOCK (MISCELLANEOUS) ×2 IMPLANT
TUBING CIL FLEX 10 FLL-RA (TUBING) ×2 IMPLANT
WIRE SAFE-T 1.5MM-J .035X260CM (WIRE) ×2 IMPLANT

## 2016-01-31 NOTE — Discharge Instructions (Signed)
Radial Site Care °Refer to this sheet in the next few weeks. These instructions provide you with information about caring for yourself after your procedure. Your health care provider may also give you more specific instructions. Your treatment has been planned according to current medical practices, but problems sometimes occur. Call your health care provider if you have any problems or questions after your procedure. °WHAT TO EXPECT AFTER THE PROCEDURE °After your procedure, it is typical to have the following: °· Bruising at the radial site that usually fades within 1-2 weeks. °· Blood collecting in the tissue (hematoma) that may be painful to the touch. It should usually decrease in size and tenderness within 1-2 weeks. °HOME CARE INSTRUCTIONS °· Take medicines only as directed by your health care provider. °· You may shower 24-48 hours after the procedure or as directed by your health care provider. Remove the bandage (dressing) and gently wash the site with plain soap and water. Pat the area dry with a clean towel. Do not rub the site, because this may cause bleeding. °· Do not take baths, swim, or use a hot tub until your health care provider approves. °· Check your insertion site every day for redness, swelling, or drainage. °· Do not apply powder or lotion to the site. °· Do not flex or bend the affected arm for 24 hours or as directed by your health care provider. °· Do not push or pull heavy objects with the affected arm for 24 hours or as directed by your health care provider. °· Do not lift over 10 lb (4.5 kg) for 5 days after your procedure or as directed by your health care provider. °· Ask your health care provider when it is okay to: °¨ Return to work or school. °¨ Resume usual physical activities or sports. °¨ Resume sexual activity. °· Do not drive home if you are discharged the same day as the procedure. Have someone else drive you. °· You may drive 24 hours after the procedure unless otherwise  instructed by your health care provider. °· Do not operate machinery or power tools for 24 hours after the procedure. °· If your procedure was done as an outpatient procedure, which means that you went home the same day as your procedure, a responsible adult should be with you for the first 24 hours after you arrive home. °· Keep all follow-up visits as directed by your health care provider. This is important. °SEEK MEDICAL CARE IF: °· You have a fever. °· You have chills. °· You have increased bleeding from the radial site. Hold pressure on the site. °SEEK IMMEDIATE MEDICAL CARE IF: °· You have unusual pain at the radial site. °· You have redness, warmth, or swelling at the radial site. °· You have drainage (other than a small amount of blood on the dressing) from the radial site. °· The radial site is bleeding, and the bleeding does not stop after 30 minutes of holding steady pressure on the site. °· Your arm or hand becomes pale, cool, tingly, or numb. °  °This information is not intended to replace advice given to you by your health care provider. Make sure you discuss any questions you have with your health care provider. °  °Document Released: 06/24/2010 Document Revised: 06/12/2014 Document Reviewed: 12/08/2013 °Elsevier Interactive Patient Education ©2016 [employer] ° °

## 2016-01-31 NOTE — H&P (View-Only) (Signed)
Melissa Davenport  Date of visit:  01/17/2016 DOB:  1940-10-15    Age:  75 yrs. Medical record number:  [id number]     Account number:  [id number] Primary Care Provider: Hulan Fess ____________________________ CURRENT DIAGNOSES  1. Dyspnea  2. Hyperlipidemia  3. Essential hypertension  4. Hypothyroidism  5. Type 2 diabetes mellitus without complications  6. Obesity  7. Encounter for preprocedural cardiovascular examination ____________________________ ALLERGIES  Flagyl, Intolerance-dizziness  Ketek, Nausea  Levaquin, Nausea ____________________________ MEDICATIONS  1. Fish Oil 300 mg-1,000 mg capsule, TID  2. multivitamin tablet, 1 p.o. daily  3. Calcium 600 600 mg (1,500 mg) tablet, BID  4. Lumigan 0.01 % eye drops, 1 gtt affected eye qd  5. Cosopt 22.3 mg-6.8 mg/mL eye drops, BID  6. Vitamin D3 1,000 unit tablet, 1 p.o. daily  7. pravastatin 20 mg tablet, 1 p.o. daily  8. selenium 200 mcg tablet, 1 p.o. daily  9. cetirizine 10 mg tablet, 1 p.o. daily  10. losartan 50 mg-hydrochlorothiazide 12.5 mg tablet, 1 p.o. daily  11. metoprolol succinate ER 100 mg tablet,extended release 24 hr, 1/2 tab daily  12. levothyroxine 100 mcg tablet, 1 p.o. daily  13. azelastine 137 mcg (0.1 %) nasal spray aerosol, bid  14. ibuprofen 800 mg tablet, PRN  15. Systane (propylene glycol) 0.4 %-0.3 % eye drops, 1 gtt into each eye as needed ____________________________ CHIEF COMPLAINTS  preop eval ____________________________ HISTORY OF PRESENT ILLNESS The patient is seen for preoperative cardiac evaluation. She has a Prior history of hypertension and prediabetes as well as hyperlipidemia and obesity. A year ago she had a prolonged episode of chest pain and had a myocardial perfusion scan showing an ejection of 72% with no evidence of ischemia. She recently developed rotator cuff problems and was having rotator cuff surgery. During the induction of anesthesia she was having an axillary block and   developed respiratory distress hypotension during surgery and was pale and looked bad. She had some sort of discomfort in her shoulder region and the surgery was canceled. I was asked to see her. Following that she developed GI upset and diarrhea slowly returned to normal. She did not really have any chest pain during the episode and her dyspnea improved somewhat over that week. She was not having PND, orthopnea or edema. She was seen in the office home the eighth she had marked anterolateral T wave inversions that were new since the previous EKG done one year ago. An echocardiogram today in the office showed concentric LVH with an ejection fraction of 60% no wall motion abnormalities. She still has limitation of motion of her right shoulder but does not have anginal. She has no edema. ____________________________ PAST HISTORY  Past Medical Illnesses:  hypertension, DM-non-insulin dependent, hyperlipidemia, hypothyroidism, kidney stones, migraine headaches, graves disease, vertigo, obesity, glaucoma;  Cardiovascular Illnesses:  no previous history of cardiac disease;  Surgical Procedures:  l ankle, l foot, hysterectomy, stent kidney, back surgery, cataract extraction OU;  NYHA Classification:  II;  Canadian Angina Classification:  Class 0: Asymptomatic;  Cardiology Procedures-Invasive:  no previous interventional or invasive cardiology procedures;  Cardiology Procedures-Noninvasive:  lexiscan Myoview June 2016, echocardiogram August 2017;  LVEF of 60% documented via echocardiogram on 01/17/2016,   ____________________________ CARDIO-PULMONARY TEST DATES EKG Date:  01/11/2016;  Nuclear Study Date:  11/12/2014;  Echocardiography Date: 01/17/2016;   ____________________________ FAMILY HISTORY Father -- Father dead, Congestive heart failure Mother -- Mother dead, Diabetes mellitus, Pacemaker in situ Sister -- Sister alive with  problem, CVA Sister -- Sister alive with problem, Diabetes mellitus, Thyroid  disease ____________________________ SOCIAL HISTORY Alcohol Use:  no alcohol use;  Smoking:  never smoked;  Diet:  low carb;  Lifestyle:  divorced;  Exercise:  no regular exercise;  Occupation:  retired, Dance movement psychotherapist;  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 Respiratory: dyspnea Cardiovascular:  please review HPI Abdominal: GI upset and frequent stoolsGenitourinary-Female: nocturia Musculoskeletal:  chronic low back pain, arthritis of the left foot, recent rotator cuff Neurological:  denies headaches, stroke, or TIA Psychiatric:  insomnia  ____________________________ PHYSICAL EXAMINATION VITAL SIGNS  Blood Pressure:  120/68 Sitting, Left arm, large cuff  , 118/60 Standing, Left arm and large cuff   Pulse:  72/min. Weight:  202.00 lbs. Height:  63"BMI: 35  Constitutional:  pleasant white female, in no acute distress, moderately obese Skin:  warm and dry to touch, no apparent skin lesions, or masses noted. Head:  normocephalic, normal hair pattern, no masses or tenderness Eyes:  EOMS Intact, PERRLA, C and S clear, Funduscopic exam not done. ENT:  ears, nose and throat reveal no gross abnormalities.  Dentition good. Neck:  supple, without massess. No JVD, thyromegaly or carotid bruits. Carotid upstroke normal. Chest:  normal symmetry, clear to auscultation. Abdomen:  abdomen soft,non-tender, no masses, no hepatospenomegaly, or aneurysm noted Peripheral Pulses:  dorsalis pedis pulse(s) 2+, posterior tibial pulse(s) diminished Extremities & Back:  tenderness and limited ROM of right shoulder,, no spinal abnormalities noted., normal muscle strength and tone. Neurological:  no gross motor or sensory deficits noted, affect appropriate, oriented  x3. ____________________________ MOST RECENT LIPID PANEL 11/12/15  CHOL TOTL 142 mg/dl, LDL 79 NM, HDL 42 mg/dl, TRIGLYCER 105 mg/dl, ALT 13 u/l, ALK PHOS 69 u/l, CHOL/HDL 3.4 (Calc) and AST 18 u/l ____________________________ IMPRESSIONS/PLAN  1. New high-risk EKG changes with significant anterolateral T wave inversions from one year ago in the setting of a clinical event to induction of anesthesia 2. Hypertensive heart disease 3. Hyperlipidemia under treatment 4. Non-insulin diabetes mellitus with glucose intolerance 5. Obesity  RECOMMENDATIONS:  The patient has a newly abnormal EKG with significant anterolateral T wave changes occurring in the setting of a clinical event. Her ejection fraction is normal. She just had a myocardial perfusion scan done about a year ago. In light of this I would recommend that we proceed with cardiac catheterization to exclude high risk LAD or left main disease in light of the EKG changes.Cardiac catheterization was discussed with the patient including risks of myocardial infarction, death, stroke, bleeding, arrhythmia, dye allergy, or renal insufficiency. She understands and is willing to proceed. Possibility of percutaneous intervention at the same setting was also discussed with the patient including risks.   ____________________________ TODAYS ORDERS  1. Complete Blood Count: Today  2. Draw PT/INR: Today  3. PTT: Today  4. Basic Metabolic Panel: Today  5. Left Heart Cath/Poss PCI: First Available                       ____________________________ Cardiology Physician:  Kerry Hough MD [facility]

## 2016-01-31 NOTE — Interval H&P Note (Signed)
History and Physical Interval Note:  01/31/2016 7:23 AM  Melissa Davenport  has presented today for cardiac cath with the diagnosis of unstable angina.  The various methods of treatment have been discussed with the patient and family. After consideration of risks, benefits and other options for treatment, the patient has consented to  Procedure(s): Left Heart Cath and Coronary Angiography (N/A) as a surgical intervention .  The patient's history has been reviewed, patient examined, no change in status, stable for surgery.  I have reviewed the patient's chart and labs.  Questions were answered to the patient's satisfaction.   Cath Lab Visit (complete for each Cath Lab visit)  Clinical Evaluation Leading to the Procedure:   ACS: No.  Non-ACS:    Anginal Classification: CCS III  Anti-ischemic medical therapy: Maximal Therapy (2 or more classes of medications)  Non-Invasive Test Results: No non-invasive testing performed  Prior CABG: No previous CABG          Lauree Chandler

## 2016-02-01 MED FILL — Verapamil HCl IV Soln 2.5 MG/ML: INTRAVENOUS | Qty: 2 | Status: AC

## 2016-09-14 ENCOUNTER — Encounter: Payer: Self-pay | Admitting: Podiatry

## 2016-09-14 ENCOUNTER — Ambulatory Visit (INDEPENDENT_AMBULATORY_CARE_PROVIDER_SITE_OTHER): Payer: PPO | Admitting: Podiatry

## 2016-09-14 DIAGNOSIS — L600 Ingrowing nail: Secondary | ICD-10-CM | POA: Diagnosis not present

## 2016-09-14 DIAGNOSIS — E119 Type 2 diabetes mellitus without complications: Secondary | ICD-10-CM | POA: Diagnosis not present

## 2016-09-14 DIAGNOSIS — Q828 Other specified congenital malformations of skin: Secondary | ICD-10-CM | POA: Diagnosis not present

## 2016-09-14 MED ORDER — NEOMYCIN-POLYMYXIN-HC 3.5-10000-1 OT SOLN
OTIC | 0 refills | Status: DC
Start: 2016-09-14 — End: 2020-03-03

## 2016-09-14 MED ORDER — NEOMYCIN-POLYMYXIN-HC 3.5-10000-1 OT SOLN: OTIC | 0 refills | Status: DC

## 2016-09-14 NOTE — Patient Instructions (Signed)

## 2016-09-17 NOTE — Progress Notes (Signed)
She presents today with chief complaint of porokeratosis plantar aspect of the feet bilaterally she has history of diabetes mellitus without consultation was also complaining of a painful area of ingrown toenail to the hallux left.  Objective: Vital signs are stable she is alert and oriented 3 pulses are palpable. Capillary fill time is immediate. No open lesions or wounds were then reactive hyperkeratosis plantar aspect bilateral foot and a nail spicule that is painful on palpation hallux left.  Assessment: Diabetes mellitus without complications. Porokeratosis bilateral. Any ingrown nail hallux left.  Plan: Debrided all reactive hyperkeratosis. Removed the portion of ingrown toenail which was small spicule dorsomedial aspect after local anesthesia was administered she tolerated the procedure well. Both oral and written home-going instructions were provided as well as prescription for Cortisporin Otic to be applied twice daily after soaking. I will follow up with her in 1-2 weeks.

## 2016-09-28 ENCOUNTER — Ambulatory Visit (INDEPENDENT_AMBULATORY_CARE_PROVIDER_SITE_OTHER): Payer: Self-pay | Admitting: Podiatry

## 2016-09-28 ENCOUNTER — Encounter: Payer: Self-pay | Admitting: Podiatry

## 2016-09-28 DIAGNOSIS — L600 Ingrowing nail: Secondary | ICD-10-CM

## 2016-09-28 DIAGNOSIS — Q828 Other specified congenital malformations of skin: Secondary | ICD-10-CM

## 2016-09-28 NOTE — Progress Notes (Signed)
She presents today for nail check left hallux. She states is doing fine really doesn't hurt.  Objective: Vital signs are stable alert oriented 3 no erythema edema cellulitis drainage or odor area appears to be healing well where the spicule was removed.  Assessment: While he was surgical foot.  Plan: Follow up with me as needed.

## 2016-10-05 DIAGNOSIS — H401131 Primary open-angle glaucoma, bilateral, mild stage: Secondary | ICD-10-CM | POA: Diagnosis not present

## 2016-10-05 DIAGNOSIS — Z961 Presence of intraocular lens: Secondary | ICD-10-CM | POA: Diagnosis not present

## 2016-11-29 DIAGNOSIS — E119 Type 2 diabetes mellitus without complications: Secondary | ICD-10-CM | POA: Diagnosis not present

## 2016-11-29 DIAGNOSIS — M899 Disorder of bone, unspecified: Secondary | ICD-10-CM | POA: Diagnosis not present

## 2016-11-29 DIAGNOSIS — E559 Vitamin D deficiency, unspecified: Secondary | ICD-10-CM | POA: Diagnosis not present

## 2016-11-29 DIAGNOSIS — E890 Postprocedural hypothyroidism: Secondary | ICD-10-CM | POA: Diagnosis not present

## 2016-11-29 DIAGNOSIS — H409 Unspecified glaucoma: Secondary | ICD-10-CM | POA: Diagnosis not present

## 2016-11-29 DIAGNOSIS — I10 Essential (primary) hypertension: Secondary | ICD-10-CM | POA: Diagnosis not present

## 2016-11-29 DIAGNOSIS — E782 Mixed hyperlipidemia: Secondary | ICD-10-CM | POA: Diagnosis not present

## 2016-11-29 DIAGNOSIS — Z0000 Encounter for general adult medical examination without abnormal findings: Secondary | ICD-10-CM | POA: Diagnosis not present

## 2016-11-29 DIAGNOSIS — R8290 Unspecified abnormal findings in urine: Secondary | ICD-10-CM | POA: Diagnosis not present

## 2016-12-04 DIAGNOSIS — H401132 Primary open-angle glaucoma, bilateral, moderate stage: Secondary | ICD-10-CM | POA: Diagnosis not present

## 2016-12-04 DIAGNOSIS — Z961 Presence of intraocular lens: Secondary | ICD-10-CM | POA: Diagnosis not present

## 2016-12-25 DIAGNOSIS — H524 Presbyopia: Secondary | ICD-10-CM | POA: Diagnosis not present

## 2017-01-25 DIAGNOSIS — H401132 Primary open-angle glaucoma, bilateral, moderate stage: Secondary | ICD-10-CM | POA: Diagnosis not present

## 2017-03-12 DIAGNOSIS — Z961 Presence of intraocular lens: Secondary | ICD-10-CM | POA: Diagnosis not present

## 2017-03-12 DIAGNOSIS — H401132 Primary open-angle glaucoma, bilateral, moderate stage: Secondary | ICD-10-CM | POA: Diagnosis not present

## 2017-04-12 DIAGNOSIS — H401132 Primary open-angle glaucoma, bilateral, moderate stage: Secondary | ICD-10-CM | POA: Diagnosis not present

## 2017-06-06 DIAGNOSIS — Z961 Presence of intraocular lens: Secondary | ICD-10-CM | POA: Diagnosis not present

## 2017-06-06 DIAGNOSIS — H401132 Primary open-angle glaucoma, bilateral, moderate stage: Secondary | ICD-10-CM | POA: Diagnosis not present

## 2017-06-19 ENCOUNTER — Other Ambulatory Visit: Payer: Self-pay | Admitting: Family Medicine

## 2017-06-19 DIAGNOSIS — Z1231 Encounter for screening mammogram for malignant neoplasm of breast: Secondary | ICD-10-CM

## 2017-07-13 ENCOUNTER — Ambulatory Visit
Admission: RE | Admit: 2017-07-13 | Discharge: 2017-07-13 | Disposition: A | Discharge status: Home / Self Care | Payer: PPO | Source: Ambulatory Visit | Attending: Family Medicine | Admitting: Family Medicine

## 2017-07-13 DIAGNOSIS — Z1231 Encounter for screening mammogram for malignant neoplasm of breast: Secondary | ICD-10-CM

## 2017-07-18 DIAGNOSIS — Z961 Presence of intraocular lens: Secondary | ICD-10-CM | POA: Diagnosis not present

## 2017-07-18 DIAGNOSIS — H401132 Primary open-angle glaucoma, bilateral, moderate stage: Secondary | ICD-10-CM | POA: Diagnosis not present

## 2017-11-08 ENCOUNTER — Ambulatory Visit: Payer: PPO | Admitting: Podiatry

## 2017-11-12 DIAGNOSIS — H401131 Primary open-angle glaucoma, bilateral, mild stage: Secondary | ICD-10-CM | POA: Diagnosis not present

## 2017-11-15 ENCOUNTER — Ambulatory Visit: Payer: PPO | Admitting: Podiatry

## 2017-11-15 ENCOUNTER — Encounter: Payer: Self-pay | Admitting: Podiatry

## 2017-11-15 DIAGNOSIS — E119 Type 2 diabetes mellitus without complications: Secondary | ICD-10-CM

## 2017-11-15 DIAGNOSIS — M79676 Pain in unspecified toe(s): Secondary | ICD-10-CM | POA: Diagnosis not present

## 2017-11-15 DIAGNOSIS — Q828 Other specified congenital malformations of skin: Secondary | ICD-10-CM

## 2017-11-15 DIAGNOSIS — B351 Tinea unguium: Secondary | ICD-10-CM | POA: Diagnosis not present

## 2017-11-15 NOTE — Progress Notes (Signed)
She presents today chief complaint of painful calluses plantar aspect of the forefoot bilateral as well as painful elongated toenails 1 through 5 bilateral.  Objective: Toenails are long thick yellow dystrophic-like mycotic reactive hyper keratomas fourth metatarsal head l bilaterally.  Toenails are long thick yellow dystrophic-like mycotic and painful on palpation.  Assessment: Pain in limb secondary to onychomycosis and porokeratosis.  Plan: Debridement of all reactive hyperkeratotic lesions debridement of toenails 1 through 5 bilateral.

## 2018-01-10 DIAGNOSIS — H409 Unspecified glaucoma: Secondary | ICD-10-CM | POA: Diagnosis not present

## 2018-01-10 DIAGNOSIS — Z136 Encounter for screening for cardiovascular disorders: Secondary | ICD-10-CM | POA: Diagnosis not present

## 2018-01-10 DIAGNOSIS — Z0000 Encounter for general adult medical examination without abnormal findings: Secondary | ICD-10-CM | POA: Diagnosis not present

## 2018-01-10 DIAGNOSIS — E782 Mixed hyperlipidemia: Secondary | ICD-10-CM | POA: Diagnosis not present

## 2018-01-10 DIAGNOSIS — E890 Postprocedural hypothyroidism: Secondary | ICD-10-CM | POA: Diagnosis not present

## 2018-01-10 DIAGNOSIS — M899 Disorder of bone, unspecified: Secondary | ICD-10-CM | POA: Diagnosis not present

## 2018-01-10 DIAGNOSIS — I10 Essential (primary) hypertension: Secondary | ICD-10-CM | POA: Diagnosis not present

## 2018-01-10 DIAGNOSIS — I517 Cardiomegaly: Secondary | ICD-10-CM | POA: Diagnosis not present

## 2018-01-10 DIAGNOSIS — R8290 Unspecified abnormal findings in urine: Secondary | ICD-10-CM | POA: Diagnosis not present

## 2018-01-10 DIAGNOSIS — Z6837 Body mass index (BMI) 37.0-37.9, adult: Secondary | ICD-10-CM | POA: Diagnosis not present

## 2018-01-10 DIAGNOSIS — E1169 Type 2 diabetes mellitus with other specified complication: Secondary | ICD-10-CM | POA: Diagnosis not present

## 2018-01-21 DIAGNOSIS — Z961 Presence of intraocular lens: Secondary | ICD-10-CM | POA: Diagnosis not present

## 2018-01-21 DIAGNOSIS — H401132 Primary open-angle glaucoma, bilateral, moderate stage: Secondary | ICD-10-CM | POA: Diagnosis not present

## 2018-01-22 DIAGNOSIS — Z01810 Encounter for preprocedural cardiovascular examination: Secondary | ICD-10-CM | POA: Diagnosis not present

## 2018-01-22 DIAGNOSIS — E039 Hypothyroidism, unspecified: Secondary | ICD-10-CM | POA: Diagnosis not present

## 2018-01-22 DIAGNOSIS — E785 Hyperlipidemia, unspecified: Secondary | ICD-10-CM | POA: Diagnosis not present

## 2018-01-22 DIAGNOSIS — I119 Hypertensive heart disease without heart failure: Secondary | ICD-10-CM | POA: Diagnosis not present

## 2018-01-22 DIAGNOSIS — I10 Essential (primary) hypertension: Secondary | ICD-10-CM | POA: Diagnosis not present

## 2018-01-22 DIAGNOSIS — E668 Other obesity: Secondary | ICD-10-CM | POA: Diagnosis not present

## 2018-01-22 DIAGNOSIS — E119 Type 2 diabetes mellitus without complications: Secondary | ICD-10-CM | POA: Diagnosis not present

## 2018-01-22 DIAGNOSIS — I517 Cardiomegaly: Secondary | ICD-10-CM | POA: Diagnosis not present

## 2018-01-22 DIAGNOSIS — R0602 Shortness of breath: Secondary | ICD-10-CM | POA: Diagnosis not present

## 2018-01-30 DIAGNOSIS — N3000 Acute cystitis without hematuria: Secondary | ICD-10-CM | POA: Diagnosis not present

## 2018-03-27 DIAGNOSIS — R05 Cough: Secondary | ICD-10-CM | POA: Diagnosis not present

## 2018-06-27 DIAGNOSIS — Z961 Presence of intraocular lens: Secondary | ICD-10-CM | POA: Diagnosis not present

## 2018-06-27 DIAGNOSIS — H1045 Other chronic allergic conjunctivitis: Secondary | ICD-10-CM | POA: Diagnosis not present

## 2018-06-27 DIAGNOSIS — H5213 Myopia, bilateral: Secondary | ICD-10-CM | POA: Diagnosis not present

## 2018-06-27 DIAGNOSIS — H401132 Primary open-angle glaucoma, bilateral, moderate stage: Secondary | ICD-10-CM | POA: Diagnosis not present

## 2018-06-27 DIAGNOSIS — H52222 Regular astigmatism, left eye: Secondary | ICD-10-CM | POA: Diagnosis not present

## 2018-06-27 DIAGNOSIS — E0590 Thyrotoxicosis, unspecified without thyrotoxic crisis or storm: Secondary | ICD-10-CM | POA: Diagnosis not present

## 2018-06-27 DIAGNOSIS — H524 Presbyopia: Secondary | ICD-10-CM | POA: Diagnosis not present

## 2018-11-06 DIAGNOSIS — E119 Type 2 diabetes mellitus without complications: Secondary | ICD-10-CM | POA: Diagnosis not present

## 2018-11-06 DIAGNOSIS — H409 Unspecified glaucoma: Secondary | ICD-10-CM | POA: Diagnosis not present

## 2018-11-06 DIAGNOSIS — E890 Postprocedural hypothyroidism: Secondary | ICD-10-CM | POA: Diagnosis not present

## 2018-11-06 DIAGNOSIS — E782 Mixed hyperlipidemia: Secondary | ICD-10-CM | POA: Diagnosis not present

## 2018-11-06 DIAGNOSIS — I10 Essential (primary) hypertension: Secondary | ICD-10-CM | POA: Diagnosis not present

## 2018-11-06 DIAGNOSIS — E1169 Type 2 diabetes mellitus with other specified complication: Secondary | ICD-10-CM | POA: Diagnosis not present

## 2018-12-23 DIAGNOSIS — H401132 Primary open-angle glaucoma, bilateral, moderate stage: Secondary | ICD-10-CM | POA: Diagnosis not present

## 2018-12-30 DIAGNOSIS — E890 Postprocedural hypothyroidism: Secondary | ICD-10-CM | POA: Diagnosis not present

## 2018-12-30 DIAGNOSIS — E782 Mixed hyperlipidemia: Secondary | ICD-10-CM | POA: Diagnosis not present

## 2018-12-30 DIAGNOSIS — H409 Unspecified glaucoma: Secondary | ICD-10-CM | POA: Diagnosis not present

## 2018-12-30 DIAGNOSIS — I10 Essential (primary) hypertension: Secondary | ICD-10-CM | POA: Diagnosis not present

## 2018-12-30 DIAGNOSIS — E119 Type 2 diabetes mellitus without complications: Secondary | ICD-10-CM | POA: Diagnosis not present

## 2018-12-30 DIAGNOSIS — E1169 Type 2 diabetes mellitus with other specified complication: Secondary | ICD-10-CM | POA: Diagnosis not present

## 2019-01-31 DIAGNOSIS — E782 Mixed hyperlipidemia: Secondary | ICD-10-CM | POA: Diagnosis not present

## 2019-01-31 DIAGNOSIS — E890 Postprocedural hypothyroidism: Secondary | ICD-10-CM | POA: Diagnosis not present

## 2019-01-31 DIAGNOSIS — Z0000 Encounter for general adult medical examination without abnormal findings: Secondary | ICD-10-CM | POA: Diagnosis not present

## 2019-01-31 DIAGNOSIS — Z6836 Body mass index (BMI) 36.0-36.9, adult: Secondary | ICD-10-CM | POA: Diagnosis not present

## 2019-01-31 DIAGNOSIS — R809 Proteinuria, unspecified: Secondary | ICD-10-CM | POA: Diagnosis not present

## 2019-01-31 DIAGNOSIS — M899 Disorder of bone, unspecified: Secondary | ICD-10-CM | POA: Diagnosis not present

## 2019-01-31 DIAGNOSIS — E1169 Type 2 diabetes mellitus with other specified complication: Secondary | ICD-10-CM | POA: Diagnosis not present

## 2019-01-31 DIAGNOSIS — I517 Cardiomegaly: Secondary | ICD-10-CM | POA: Diagnosis not present

## 2019-01-31 DIAGNOSIS — I10 Essential (primary) hypertension: Secondary | ICD-10-CM | POA: Diagnosis not present

## 2019-01-31 DIAGNOSIS — R8290 Unspecified abnormal findings in urine: Secondary | ICD-10-CM | POA: Diagnosis not present

## 2019-01-31 DIAGNOSIS — H409 Unspecified glaucoma: Secondary | ICD-10-CM | POA: Diagnosis not present

## 2019-02-04 ENCOUNTER — Other Ambulatory Visit: Payer: Self-pay | Admitting: Family Medicine

## 2019-02-04 DIAGNOSIS — M8580 Other specified disorders of bone density and structure, unspecified site: Secondary | ICD-10-CM

## 2019-02-04 DIAGNOSIS — Z1231 Encounter for screening mammogram for malignant neoplasm of breast: Secondary | ICD-10-CM

## 2019-04-22 ENCOUNTER — Ambulatory Visit
Admission: RE | Admit: 2019-04-22 | Discharge: 2019-04-22 | Disposition: A | Discharge status: Home / Self Care | Payer: PPO | Source: Ambulatory Visit | Attending: Family Medicine | Admitting: Family Medicine

## 2019-04-22 ENCOUNTER — Other Ambulatory Visit: Payer: Self-pay

## 2019-04-22 DIAGNOSIS — M8580 Other specified disorders of bone density and structure, unspecified site: Secondary | ICD-10-CM

## 2019-04-22 DIAGNOSIS — Z1231 Encounter for screening mammogram for malignant neoplasm of breast: Secondary | ICD-10-CM | POA: Diagnosis not present

## 2019-04-22 DIAGNOSIS — M85852 Other specified disorders of bone density and structure, left thigh: Secondary | ICD-10-CM | POA: Diagnosis not present

## 2019-04-22 DIAGNOSIS — Z780 Asymptomatic menopausal state: Secondary | ICD-10-CM | POA: Diagnosis not present

## 2019-05-09 DIAGNOSIS — E119 Type 2 diabetes mellitus without complications: Secondary | ICD-10-CM | POA: Diagnosis not present

## 2019-05-09 DIAGNOSIS — H409 Unspecified glaucoma: Secondary | ICD-10-CM | POA: Diagnosis not present

## 2019-05-09 DIAGNOSIS — E890 Postprocedural hypothyroidism: Secondary | ICD-10-CM | POA: Diagnosis not present

## 2019-05-09 DIAGNOSIS — I10 Essential (primary) hypertension: Secondary | ICD-10-CM | POA: Diagnosis not present

## 2019-05-09 DIAGNOSIS — E782 Mixed hyperlipidemia: Secondary | ICD-10-CM | POA: Diagnosis not present

## 2019-05-09 DIAGNOSIS — E1169 Type 2 diabetes mellitus with other specified complication: Secondary | ICD-10-CM | POA: Diagnosis not present

## 2019-06-11 DIAGNOSIS — E119 Type 2 diabetes mellitus without complications: Secondary | ICD-10-CM | POA: Diagnosis not present

## 2019-06-11 DIAGNOSIS — I10 Essential (primary) hypertension: Secondary | ICD-10-CM | POA: Diagnosis not present

## 2019-06-11 DIAGNOSIS — E890 Postprocedural hypothyroidism: Secondary | ICD-10-CM | POA: Diagnosis not present

## 2019-06-11 DIAGNOSIS — E782 Mixed hyperlipidemia: Secondary | ICD-10-CM | POA: Diagnosis not present

## 2019-06-11 DIAGNOSIS — H409 Unspecified glaucoma: Secondary | ICD-10-CM | POA: Diagnosis not present

## 2019-06-11 DIAGNOSIS — E1169 Type 2 diabetes mellitus with other specified complication: Secondary | ICD-10-CM | POA: Diagnosis not present

## 2019-07-02 DIAGNOSIS — E0590 Thyrotoxicosis, unspecified without thyrotoxic crisis or storm: Secondary | ICD-10-CM | POA: Diagnosis not present

## 2019-07-02 DIAGNOSIS — Z961 Presence of intraocular lens: Secondary | ICD-10-CM | POA: Diagnosis not present

## 2019-07-02 DIAGNOSIS — H1045 Other chronic allergic conjunctivitis: Secondary | ICD-10-CM | POA: Diagnosis not present

## 2019-07-02 DIAGNOSIS — H524 Presbyopia: Secondary | ICD-10-CM | POA: Diagnosis not present

## 2019-07-02 DIAGNOSIS — H5213 Myopia, bilateral: Secondary | ICD-10-CM | POA: Diagnosis not present

## 2019-07-02 DIAGNOSIS — H52222 Regular astigmatism, left eye: Secondary | ICD-10-CM | POA: Diagnosis not present

## 2019-07-02 DIAGNOSIS — H401132 Primary open-angle glaucoma, bilateral, moderate stage: Secondary | ICD-10-CM | POA: Diagnosis not present

## 2019-07-23 DIAGNOSIS — I10 Essential (primary) hypertension: Secondary | ICD-10-CM | POA: Diagnosis not present

## 2019-07-23 DIAGNOSIS — E1169 Type 2 diabetes mellitus with other specified complication: Secondary | ICD-10-CM | POA: Diagnosis not present

## 2019-07-23 DIAGNOSIS — E782 Mixed hyperlipidemia: Secondary | ICD-10-CM | POA: Diagnosis not present

## 2019-07-23 DIAGNOSIS — E119 Type 2 diabetes mellitus without complications: Secondary | ICD-10-CM | POA: Diagnosis not present

## 2019-07-23 DIAGNOSIS — E890 Postprocedural hypothyroidism: Secondary | ICD-10-CM | POA: Diagnosis not present

## 2019-07-23 DIAGNOSIS — H409 Unspecified glaucoma: Secondary | ICD-10-CM | POA: Diagnosis not present

## 2019-09-16 ENCOUNTER — Ambulatory Visit: Payer: PPO | Admitting: Podiatry

## 2019-09-16 ENCOUNTER — Other Ambulatory Visit: Payer: Self-pay

## 2019-09-16 DIAGNOSIS — B351 Tinea unguium: Secondary | ICD-10-CM

## 2019-09-16 DIAGNOSIS — Q828 Other specified congenital malformations of skin: Secondary | ICD-10-CM | POA: Diagnosis not present

## 2019-09-16 DIAGNOSIS — M79676 Pain in unspecified toe(s): Secondary | ICD-10-CM

## 2019-09-16 DIAGNOSIS — E119 Type 2 diabetes mellitus without complications: Secondary | ICD-10-CM

## 2019-09-16 NOTE — Progress Notes (Signed)
She presents today chief complaint of painfully elongated toenails and painful callus plantar aspect of the forefoot left.  Denies any open wounds or lesions.  Objective: Vital signs are stable alert oriented x3 toenails are grossly elongated thick yellow dystrophic clinically mycotic painful palpation.  Porokeratotic lesion plantar aspect of the forefoot secondary to a plantarflexed fourth metatarsal left.  No open lesions or wounds no preulcerative lesions noted.  Assessment: Pain in limb secondary to onychomycosis and porokeratosis with diabetes mellitus not complications.  Plan: Debridement of all reactive hyperkeratotic tissue debridement of toenails 1 through 5 bilateral.

## 2019-09-29 DIAGNOSIS — H409 Unspecified glaucoma: Secondary | ICD-10-CM | POA: Diagnosis not present

## 2019-09-29 DIAGNOSIS — E119 Type 2 diabetes mellitus without complications: Secondary | ICD-10-CM | POA: Diagnosis not present

## 2019-09-29 DIAGNOSIS — I10 Essential (primary) hypertension: Secondary | ICD-10-CM | POA: Diagnosis not present

## 2019-09-29 DIAGNOSIS — E1169 Type 2 diabetes mellitus with other specified complication: Secondary | ICD-10-CM | POA: Diagnosis not present

## 2019-09-29 DIAGNOSIS — E782 Mixed hyperlipidemia: Secondary | ICD-10-CM | POA: Diagnosis not present

## 2019-09-29 DIAGNOSIS — E890 Postprocedural hypothyroidism: Secondary | ICD-10-CM | POA: Diagnosis not present

## 2019-11-10 DIAGNOSIS — E890 Postprocedural hypothyroidism: Secondary | ICD-10-CM | POA: Diagnosis not present

## 2019-11-10 DIAGNOSIS — E782 Mixed hyperlipidemia: Secondary | ICD-10-CM | POA: Diagnosis not present

## 2019-11-10 DIAGNOSIS — E119 Type 2 diabetes mellitus without complications: Secondary | ICD-10-CM | POA: Diagnosis not present

## 2019-11-10 DIAGNOSIS — I10 Essential (primary) hypertension: Secondary | ICD-10-CM | POA: Diagnosis not present

## 2019-11-10 DIAGNOSIS — E1169 Type 2 diabetes mellitus with other specified complication: Secondary | ICD-10-CM | POA: Diagnosis not present

## 2019-11-10 DIAGNOSIS — H409 Unspecified glaucoma: Secondary | ICD-10-CM | POA: Diagnosis not present

## 2019-12-12 DIAGNOSIS — Z961 Presence of intraocular lens: Secondary | ICD-10-CM | POA: Diagnosis not present

## 2019-12-12 DIAGNOSIS — H401132 Primary open-angle glaucoma, bilateral, moderate stage: Secondary | ICD-10-CM | POA: Diagnosis not present

## 2019-12-23 ENCOUNTER — Ambulatory Visit: Payer: PPO | Admitting: Podiatry

## 2019-12-23 ENCOUNTER — Encounter: Payer: Self-pay | Admitting: Podiatry

## 2019-12-23 ENCOUNTER — Other Ambulatory Visit: Payer: Self-pay

## 2019-12-23 DIAGNOSIS — M79676 Pain in unspecified toe(s): Secondary | ICD-10-CM

## 2019-12-23 DIAGNOSIS — Q828 Other specified congenital malformations of skin: Secondary | ICD-10-CM | POA: Diagnosis not present

## 2019-12-23 DIAGNOSIS — E119 Type 2 diabetes mellitus without complications: Secondary | ICD-10-CM | POA: Diagnosis not present

## 2019-12-23 DIAGNOSIS — B351 Tinea unguium: Secondary | ICD-10-CM | POA: Diagnosis not present

## 2019-12-23 NOTE — Progress Notes (Signed)
She presents today chief complaint of painful elongated toenails with calluses to the plantar aspect of the forefoot bilaterally.  Objective: Vital signs are stable alert oriented x3.  Pulses are palpable.  There is no erythema edema cellulitis drainage or odor.  She does have thick yellow dystrophic clinically mycotic nails with areas of multiple reactive hyperkeratotic lesions.  Assessment: Pain in limb secondary to onychomycosis and porokeratosis.  Plan: Debridement of toenails 1 through 5 bilateral.  Debridement of all reactive hyperkeratotic tissue.

## 2019-12-31 DIAGNOSIS — E119 Type 2 diabetes mellitus without complications: Secondary | ICD-10-CM | POA: Diagnosis not present

## 2019-12-31 DIAGNOSIS — H409 Unspecified glaucoma: Secondary | ICD-10-CM | POA: Diagnosis not present

## 2019-12-31 DIAGNOSIS — E890 Postprocedural hypothyroidism: Secondary | ICD-10-CM | POA: Diagnosis not present

## 2019-12-31 DIAGNOSIS — E1169 Type 2 diabetes mellitus with other specified complication: Secondary | ICD-10-CM | POA: Diagnosis not present

## 2019-12-31 DIAGNOSIS — E782 Mixed hyperlipidemia: Secondary | ICD-10-CM | POA: Diagnosis not present

## 2019-12-31 DIAGNOSIS — I10 Essential (primary) hypertension: Secondary | ICD-10-CM | POA: Diagnosis not present

## 2020-01-28 DIAGNOSIS — H409 Unspecified glaucoma: Secondary | ICD-10-CM | POA: Diagnosis not present

## 2020-01-28 DIAGNOSIS — E782 Mixed hyperlipidemia: Secondary | ICD-10-CM | POA: Diagnosis not present

## 2020-01-28 DIAGNOSIS — E890 Postprocedural hypothyroidism: Secondary | ICD-10-CM | POA: Diagnosis not present

## 2020-01-28 DIAGNOSIS — Z794 Long term (current) use of insulin: Secondary | ICD-10-CM | POA: Diagnosis not present

## 2020-01-28 DIAGNOSIS — I10 Essential (primary) hypertension: Secondary | ICD-10-CM | POA: Diagnosis not present

## 2020-01-28 DIAGNOSIS — E119 Type 2 diabetes mellitus without complications: Secondary | ICD-10-CM | POA: Diagnosis not present

## 2020-01-28 DIAGNOSIS — E1169 Type 2 diabetes mellitus with other specified complication: Secondary | ICD-10-CM | POA: Diagnosis not present

## 2020-02-11 DIAGNOSIS — E782 Mixed hyperlipidemia: Secondary | ICD-10-CM | POA: Diagnosis not present

## 2020-02-11 DIAGNOSIS — R8290 Unspecified abnormal findings in urine: Secondary | ICD-10-CM | POA: Diagnosis not present

## 2020-02-11 DIAGNOSIS — Z6837 Body mass index (BMI) 37.0-37.9, adult: Secondary | ICD-10-CM | POA: Diagnosis not present

## 2020-02-11 DIAGNOSIS — I10 Essential (primary) hypertension: Secondary | ICD-10-CM | POA: Diagnosis not present

## 2020-02-11 DIAGNOSIS — Z23 Encounter for immunization: Secondary | ICD-10-CM | POA: Diagnosis not present

## 2020-02-11 DIAGNOSIS — Z0000 Encounter for general adult medical examination without abnormal findings: Secondary | ICD-10-CM | POA: Diagnosis not present

## 2020-02-11 DIAGNOSIS — H409 Unspecified glaucoma: Secondary | ICD-10-CM | POA: Diagnosis not present

## 2020-02-11 DIAGNOSIS — I517 Cardiomegaly: Secondary | ICD-10-CM | POA: Diagnosis not present

## 2020-02-11 DIAGNOSIS — M899 Disorder of bone, unspecified: Secondary | ICD-10-CM | POA: Diagnosis not present

## 2020-02-11 DIAGNOSIS — E890 Postprocedural hypothyroidism: Secondary | ICD-10-CM | POA: Diagnosis not present

## 2020-02-11 DIAGNOSIS — E1169 Type 2 diabetes mellitus with other specified complication: Secondary | ICD-10-CM | POA: Diagnosis not present

## 2020-02-25 DIAGNOSIS — E119 Type 2 diabetes mellitus without complications: Secondary | ICD-10-CM | POA: Diagnosis not present

## 2020-02-25 DIAGNOSIS — E1169 Type 2 diabetes mellitus with other specified complication: Secondary | ICD-10-CM | POA: Diagnosis not present

## 2020-02-25 DIAGNOSIS — E890 Postprocedural hypothyroidism: Secondary | ICD-10-CM | POA: Diagnosis not present

## 2020-02-25 DIAGNOSIS — E782 Mixed hyperlipidemia: Secondary | ICD-10-CM | POA: Diagnosis not present

## 2020-02-25 DIAGNOSIS — H409 Unspecified glaucoma: Secondary | ICD-10-CM | POA: Diagnosis not present

## 2020-02-25 DIAGNOSIS — I10 Essential (primary) hypertension: Secondary | ICD-10-CM | POA: Diagnosis not present

## 2020-03-17 ENCOUNTER — Ambulatory Visit (INDEPENDENT_AMBULATORY_CARE_PROVIDER_SITE_OTHER): Payer: PPO | Admitting: Internal Medicine

## 2020-03-17 ENCOUNTER — Other Ambulatory Visit: Payer: Self-pay

## 2020-03-17 ENCOUNTER — Encounter: Payer: Self-pay | Admitting: Internal Medicine

## 2020-03-17 VITALS — BP 120/70 | HR 65 | Ht 63.0 in | Wt 204.0 lb

## 2020-03-17 DIAGNOSIS — R9431 Abnormal electrocardiogram [ECG] [EKG]: Secondary | ICD-10-CM | POA: Diagnosis not present

## 2020-03-17 DIAGNOSIS — I10 Essential (primary) hypertension: Secondary | ICD-10-CM | POA: Diagnosis not present

## 2020-03-17 DIAGNOSIS — E785 Hyperlipidemia, unspecified: Secondary | ICD-10-CM | POA: Diagnosis not present

## 2020-03-17 DIAGNOSIS — I517 Cardiomegaly: Secondary | ICD-10-CM | POA: Diagnosis not present

## 2020-03-17 NOTE — Progress Notes (Signed)
Cardiology Office Note:    Date:  03/17/2020   ID:  Melissa Davenport, DOB 10-03-1940, MRN [id number]  PCP:  Hulan Fess, MD  Cardiologist:  No primary care provider on file.  Electrophysiologist:  None   Referring MD: Hulan Fess, MD   Chief Complaint/Reason for Referral: Left ventricular hypertrophy  History of Present Illness:    Melissa Davenport is a 79 y.o. female with a history of HTN, HLD, Graves disease s/p radioactive iodine ablation 07/08/09 with post ablative hypothyroidism, Type 2 diabetes.  Her cardiovascular workup has included an episode of perioperative hypotension during induction of an axillary block for rotator cuff surgery. She was seen by my colleague Dr. Wynonia Lawman and echo showed moderate LVH by report, and cardiac catheterization showed nonobstructive CAD.   BP is stable overall, continues on losartan HCTZ 50-12.5 mg and metoprolol succinate 50 mg daily.   Lipids goal <70, currently LDL is 80. On pravastatin.   Overall, feeling well. The patient denies chest pain, chest pressure, dyspnea at rest or with exertion, palpitations, PND, orthopnea, or leg swelling. Denies cough, fever, chills. Denies nausea, vomiting. Denies syncope or presyncope. Denies dizziness or lightheadedness.   Past Medical History:  Diagnosis Date  . Allergic rhinitis   . Allergy    allergic rhinitis  . Basal cell carcinoma   . Cancer ([place])   . Cataract   . Diabetes mellitus without complication ([place])   . Glaucoma   . Graves disease   . Hyperlipidemia   . Hypertension   . Kidney stones   . LVH (left ventricular hypertrophy) 2017   Mild to moderate  . Meniere's disease   . Migraines   . Mixed dyslipidemia   . Osteopenia   . Thyroid disease   . Vitamin D deficiency     Past Surgical History:  Procedure Laterality Date  . ABDOMINAL HYSTERECTOMY    . CARDIAC CATHETERIZATION N/A 01/31/2016   Procedure: Left Heart Cath and Coronary Angiography;  Surgeon: Burnell Blanks, MD;   Location: [place] [facility];  Service: Cardiovascular;  Laterality: N/A;  . CATARACT EXTRACTION Bilateral   . COLONOSCOPY  2008 & 2014  . EYE SURGERY    . FLEXIBLE SIGMOIDOSCOPY    . left ankle surgery    . left foot surgery    . SPINE SURGERY      Current Medications: Current Meds  Medication Sig  . aspirin 81 MG tablet Take 81 mg by mouth daily.  . AZELASTINE-FLUTICASONE-NACL NA Place into the nose.  . bimatoprost (LUMIGAN) 0.01 % SOLN Place 1 drop into both eyes at bedtime.   . cetirizine (ZYRTEC) 10 MG tablet Take 1 tablet by mouth daily as needed for allergies.   . Cholecalciferol (VITAMIN D-1000 MAX ST) 1000 UNITS tablet Take 1 tablet by mouth daily.  . dorzolamide-timolol (COSOPT) 22.3-6.8 MG/ML ophthalmic solution Place 1 drop into both eyes 2 (two) times daily.  . fluticasone (FLONASE) 50 MCG/ACT nasal spray Place 1 spray into both nostrils daily as needed for allergies. Use only needed  . levothyroxine (SYNTHROID, LEVOTHROID) 100 MCG tablet Take 100 mcg by mouth daily before breakfast.  . losartan-hydrochlorothiazide (HYZAAR) 50-12.5 MG per tablet Take 1 tablet by mouth daily.  . metoprolol succinate (TOPROL-XL) 50 MG 24 hr tablet Take 50 mg by mouth daily. Take with or immediately following a meal.  . Multiple Vitamin (MULTI-VITAMINS) TABS Take 1 tablet by mouth daily.  . niacin 500 MG tablet Take 1 tablet by mouth daily.  Marland Kitchen  Omega-3 1000 MG CAPS Take 3 tablets by mouth daily.  . pravastatin (PRAVACHOL) 20 MG tablet Take 20 mg by mouth daily.     Allergies:   Brimonidine, Flagyl [metronidazole], Ketek [telithromycin], and Levaquin [levofloxacin]   Social History   Tobacco Use  . Smoking status: Never Smoker  . Smokeless tobacco: Never Used  Vaping Use  . Vaping Use: Never used  Substance Use Topics  . Alcohol use: No  . Drug use: No     Family History: The patient's family history includes CVA in her sister; Diabetes in her sister; Emphysema in her father; Heart  disease (age of onset: 29) in her father; Heart disease (age of onset: 32) in her mother; Hypothyroidism in her daughter.  ROS:   Please see the history of present illness.    All other systems reviewed and are negative.  EKGs/Labs/Other Studies Reviewed:    The following studies were reviewed today:  EKG:  NSR, ST T wave abnormality, inferiorly and anterolaterlly  Recent Labs: No results found for requested labs within last 8760 hours.  Recent Lipid Panel No results found for: CHOL, TRIG, HDL, CHOLHDL, VLDL, LDLCALC, LDLDIRECT  Physical Exam:    VS:  BP 120/70   Pulse 65   Ht 5\' 3"  (1.6 m)   Wt 204 lb (92.5 kg)   SpO2 96%   BMI 36.14 kg/m     Wt Readings from Last 5 Encounters:  03/17/20 204 lb (92.5 kg)  01/31/16 200 lb (90.7 kg)  12/16/14 195 lb 4.8 oz (88.6 kg)  11/13/13 200 lb (90.7 kg)  09/24/13 206 lb 4.8 oz (93.6 kg)    Constitutional: No acute distress Eyes: sclera non-icteric, normal conjunctiva and lids ENMT: mask in place over nose and mouth Cardiovascular: regular rhythm, normal rate, no murmurs. S1 and S2 normal. Radial pulses normal bilaterally. No jugular venous distention.  Respiratory: clear to auscultation bilaterally GI : normal bowel sounds, soft and nontender. No distention.   MSK: extremities warm, well perfused. No edema.  NEURO: grossly nonfocal exam, moves all extremities. PSYCH: alert and oriented x 3, normal mood and affect.   ASSESSMENT:    1. Left ventricular hypertrophy   2. Abnormal EKG   3. Essential hypertension   4. Hyperlipidemia, unspecified hyperlipidemia type    PLAN:    We discussed reevaluating left ventricular hypertrophy for any abnormal features, and performing new echo measurements such as left ventricular strain to better understand the nature of the hypertrophy. We will repeat an echocardiogram at this time.   BP stable at this time, continue Hyzaar and metoprolol.  Lipids with reasonably good control, can  continue current dose of pravastatin.   Total time of encounter: 45 minutes total time of encounter, including 25 minutes spent in face-to-face patient care on the date of this encounter. This time includes coordination of care and counseling regarding above mentioned problem list. Remainder of non-face-to-face time involved reviewing chart documents/testing relevant to the patient encounter and documentation in the medical record. I have independently reviewed documentation from referring provider. 34 pages of outside medical records reviewed in conjunction with this consultation.  Cherlynn Kaiser, MD [place]  [facility]    Medication Adjustments/Labs and Tests Ordered: Current medicines are reviewed at length with the patient today.  Concerns regarding medicines are outlined above.   Orders Placed This Encounter  Procedures  . ECHOCARDIOGRAM COMPLETE    No orders of the defined types were placed in this encounter.   Patient  Instructions  Medication Instructions:  None Ordered At This Time.  *If you need a refill on your cardiac medications before your next appointment, please call your pharmacy*  Lab Work: None Ordered At This Time.  If you have labs (blood work) drawn today and your tests are completely normal, you will receive your results only by: Marland Kitchen MyChart Message (if you have MyChart) OR . A paper copy in the mail If you have any lab test that is abnormal or we need to change your treatment, we will call you to review the results.  Testing/Procedures: Your physician has requested that you have an echocardiogram. Echocardiography is a painless test that uses sound waves to create images of your heart. It provides your doctor with information about the size and shape of your heart and how well your heart's chambers and valves are working. You may receive an ultrasound enhancing agent through an IV if needed to better visualize your heart during the echo.This procedure  takes approximately one hour. There are no restrictions for this procedure. This will take place at the [address], [address].   Follow-Up: At [facility], you and your health needs are our priority.  As part of our continuing mission to provide you with exceptional heart care, we have created designated Provider Care Teams.  These Care Teams include your primary Cardiologist (physician) and Advanced Practice Providers (APPs -  Physician Assistants and Nurse Practitioners) who all work together to provide you with the care you need, when you need it.  We recommend signing up for the patient portal called "MyChart".  Sign up information is provided on this After Visit Summary.  MyChart is used to connect with patients for Virtual Visits (Telemedicine).  Patients are able to view lab/test results, encounter notes, upcoming appointments, etc.  Non-urgent messages can be sent to your provider as well.   To learn more about what you can do with MyChart, go to [email or web address].    Your next appointment:   6 month(s)  The format for your next appointment:   In Person  Provider:   Cherlynn Kaiser, MD

## 2020-03-17 NOTE — Patient Instructions (Signed)
Medication Instructions:  None Ordered At This Time.  *If you need a refill on your cardiac medications before your next appointment, please call your pharmacy*  Lab Work: None Ordered At This Time.  If you have labs (blood work) drawn today and your tests are completely normal, you will receive your results only by: Marland Kitchen MyChart Message (if you have MyChart) OR . A paper copy in the mail If you have any lab test that is abnormal or we need to change your treatment, we will call you to review the results.  Testing/Procedures: Your physician has requested that you have an echocardiogram. Echocardiography is a painless test that uses sound waves to create images of your heart. It provides your doctor with information about the size and shape of your heart and how well your heart's chambers and valves are working. You may receive an ultrasound enhancing agent through an IV if needed to better visualize your heart during the echo.This procedure takes approximately one hour. There are no restrictions for this procedure. This will take place at the [address].   Follow-Up: At [facility], you and your health needs are our priority.  As part of our continuing mission to provide you with exceptional heart care, we have created designated Provider Care Teams.  These Care Teams include your primary Cardiologist (physician) and Advanced Practice Providers (APPs -  Physician Assistants and Nurse Practitioners) who all work together to provide you with the care you need, when you need it.  We recommend signing up for the patient portal called "MyChart".  Sign up information is provided on this After Visit Summary.  MyChart is used to connect with patients for Virtual Visits (Telemedicine).  Patients are able to view lab/test results, encounter notes, upcoming appointments, etc.  Non-urgent messages can be sent to your provider as well.   To learn more about what you can do with MyChart, go to  [email or web address].    Your next appointment:   6 month(s)  The format for your next appointment:   In Person  Provider:   Cherlynn Kaiser, MD

## 2020-03-25 DIAGNOSIS — H409 Unspecified glaucoma: Secondary | ICD-10-CM | POA: Diagnosis not present

## 2020-03-25 DIAGNOSIS — E782 Mixed hyperlipidemia: Secondary | ICD-10-CM | POA: Diagnosis not present

## 2020-03-25 DIAGNOSIS — E890 Postprocedural hypothyroidism: Secondary | ICD-10-CM | POA: Diagnosis not present

## 2020-03-25 DIAGNOSIS — E1169 Type 2 diabetes mellitus with other specified complication: Secondary | ICD-10-CM | POA: Diagnosis not present

## 2020-03-25 DIAGNOSIS — I10 Essential (primary) hypertension: Secondary | ICD-10-CM | POA: Diagnosis not present

## 2020-03-25 DIAGNOSIS — E119 Type 2 diabetes mellitus without complications: Secondary | ICD-10-CM | POA: Diagnosis not present

## 2020-04-07 ENCOUNTER — Ambulatory Visit (HOSPITAL_COMMUNITY): Payer: PPO | Attending: Internal Medicine

## 2020-04-07 ENCOUNTER — Other Ambulatory Visit: Payer: Self-pay

## 2020-04-07 DIAGNOSIS — I517 Cardiomegaly: Secondary | ICD-10-CM | POA: Diagnosis not present

## 2020-04-07 LAB — ECHOCARDIOGRAM COMPLETE
Area-P 1/2: 4.49 cm2
S' Lateral: 2.6 cm

## 2020-04-07 MED ORDER — PERFLUTREN LIPID MICROSPHERE
1.0000 mL | INTRAVENOUS | Status: AC | PRN
Start: 2020-04-07 — End: 2020-04-07
  Administered 2020-04-07: 2 mL via INTRAVENOUS

## 2020-04-09 NOTE — Addendum Note (Signed)
Addended by: Rexanne Mano B on: 04/09/2020 01:49 PM   Modules accepted: Orders

## 2020-04-12 ENCOUNTER — Other Ambulatory Visit: Payer: Self-pay | Admitting: Family Medicine

## 2020-04-12 DIAGNOSIS — Z1231 Encounter for screening mammogram for malignant neoplasm of breast: Secondary | ICD-10-CM

## 2020-04-19 ENCOUNTER — Telehealth: Payer: Self-pay | Admitting: Internal Medicine

## 2020-04-19 NOTE — Telephone Encounter (Signed)
Patient calling for results for echo done on 04/07/20. Please call/advise.   Thank you!

## 2020-04-19 NOTE — Telephone Encounter (Signed)
Spoke with patient, advised patient that as soon as Dr. Margaretann Loveless has reviewed her echo someone will give her a call with results. Patient verbalized understanding.

## 2020-04-19 NOTE — Telephone Encounter (Addendum)
Spoke with patient, results to recent Echocardiogram given:  Melissa Munroe, MD  04/19/2020 3:42 PM EST Back to Top    Mild left ventricular hypetrophy. They were not able to get accurate advanced measurements including strain or 3D due to image quality.  If she still feels well, no further action needed. If she has concerning symptoms (CP, SOB, excessive fatigue, or fluid buildup) please let me know and we can consider further testing.   Patient made aware and verbalized understanding. Patient denies any symptoms at this time.   Patient would like to know what will happen if the LVH were to worsen? Will forward to Dr. Margaretann Davenport

## 2020-04-27 ENCOUNTER — Ambulatory Visit: Payer: PPO | Admitting: Podiatry

## 2020-04-28 DIAGNOSIS — E119 Type 2 diabetes mellitus without complications: Secondary | ICD-10-CM | POA: Diagnosis not present

## 2020-04-28 DIAGNOSIS — E1169 Type 2 diabetes mellitus with other specified complication: Secondary | ICD-10-CM | POA: Diagnosis not present

## 2020-04-28 DIAGNOSIS — I10 Essential (primary) hypertension: Secondary | ICD-10-CM | POA: Diagnosis not present

## 2020-04-28 DIAGNOSIS — E782 Mixed hyperlipidemia: Secondary | ICD-10-CM | POA: Diagnosis not present

## 2020-04-28 DIAGNOSIS — E890 Postprocedural hypothyroidism: Secondary | ICD-10-CM | POA: Diagnosis not present

## 2020-04-28 DIAGNOSIS — H409 Unspecified glaucoma: Secondary | ICD-10-CM | POA: Diagnosis not present

## 2020-05-13 ENCOUNTER — Encounter: Payer: Self-pay | Admitting: Podiatry

## 2020-05-13 ENCOUNTER — Ambulatory Visit: Payer: PPO | Admitting: Podiatry

## 2020-05-13 ENCOUNTER — Other Ambulatory Visit: Payer: Self-pay

## 2020-05-13 DIAGNOSIS — M79676 Pain in unspecified toe(s): Secondary | ICD-10-CM

## 2020-05-13 DIAGNOSIS — E119 Type 2 diabetes mellitus without complications: Secondary | ICD-10-CM

## 2020-05-13 DIAGNOSIS — L989 Disorder of the skin and subcutaneous tissue, unspecified: Secondary | ICD-10-CM | POA: Diagnosis not present

## 2020-05-13 DIAGNOSIS — B351 Tinea unguium: Secondary | ICD-10-CM | POA: Diagnosis not present

## 2020-05-13 NOTE — Progress Notes (Signed)
Presents today chief complaint of painfully elongated toenails multiple calluses bilaterally.  Objective: Toenails are long thick yellow dystrophic-like mycotic multiple calluses plantar aspect left foot.  Pulses remain strong and palpable.  Assessment: Pain in limb secondary to onychomycosis and poor keratoma.  Plan: Debridement of benign skin lesion and debridement of toenails 1 through 5 bilateral.

## 2020-05-19 DIAGNOSIS — E782 Mixed hyperlipidemia: Secondary | ICD-10-CM | POA: Diagnosis not present

## 2020-05-19 DIAGNOSIS — H409 Unspecified glaucoma: Secondary | ICD-10-CM | POA: Diagnosis not present

## 2020-05-19 DIAGNOSIS — I10 Essential (primary) hypertension: Secondary | ICD-10-CM | POA: Diagnosis not present

## 2020-05-19 DIAGNOSIS — E890 Postprocedural hypothyroidism: Secondary | ICD-10-CM | POA: Diagnosis not present

## 2020-05-19 DIAGNOSIS — E119 Type 2 diabetes mellitus without complications: Secondary | ICD-10-CM | POA: Diagnosis not present

## 2020-05-19 DIAGNOSIS — E1169 Type 2 diabetes mellitus with other specified complication: Secondary | ICD-10-CM | POA: Diagnosis not present

## 2020-05-21 ENCOUNTER — Other Ambulatory Visit: Payer: Self-pay | Admitting: Family Medicine

## 2020-05-21 ENCOUNTER — Ambulatory Visit
Admission: RE | Admit: 2020-05-21 | Discharge: 2020-05-21 | Disposition: A | Discharge status: Home / Self Care | Payer: PPO | Source: Ambulatory Visit | Attending: Family Medicine | Admitting: Family Medicine

## 2020-05-21 ENCOUNTER — Other Ambulatory Visit: Payer: Self-pay

## 2020-05-21 DIAGNOSIS — Z1231 Encounter for screening mammogram for malignant neoplasm of breast: Secondary | ICD-10-CM | POA: Diagnosis not present

## 2020-06-29 DIAGNOSIS — I10 Essential (primary) hypertension: Secondary | ICD-10-CM | POA: Diagnosis not present

## 2020-06-29 DIAGNOSIS — E1169 Type 2 diabetes mellitus with other specified complication: Secondary | ICD-10-CM | POA: Diagnosis not present

## 2020-06-29 DIAGNOSIS — E782 Mixed hyperlipidemia: Secondary | ICD-10-CM | POA: Diagnosis not present

## 2020-06-29 DIAGNOSIS — H409 Unspecified glaucoma: Secondary | ICD-10-CM | POA: Diagnosis not present

## 2020-06-29 DIAGNOSIS — E119 Type 2 diabetes mellitus without complications: Secondary | ICD-10-CM | POA: Diagnosis not present

## 2020-06-29 DIAGNOSIS — E890 Postprocedural hypothyroidism: Secondary | ICD-10-CM | POA: Diagnosis not present

## 2020-08-12 ENCOUNTER — Other Ambulatory Visit: Payer: Self-pay

## 2020-08-12 ENCOUNTER — Ambulatory Visit: Payer: PPO | Admitting: Podiatry

## 2020-08-12 DIAGNOSIS — D2371 Other benign neoplasm of skin of right lower limb, including hip: Secondary | ICD-10-CM

## 2020-08-12 DIAGNOSIS — M79676 Pain in unspecified toe(s): Secondary | ICD-10-CM

## 2020-08-12 DIAGNOSIS — D2372 Other benign neoplasm of skin of left lower limb, including hip: Secondary | ICD-10-CM

## 2020-08-12 DIAGNOSIS — E119 Type 2 diabetes mellitus without complications: Secondary | ICD-10-CM | POA: Diagnosis not present

## 2020-08-12 DIAGNOSIS — B351 Tinea unguium: Secondary | ICD-10-CM | POA: Diagnosis not present

## 2020-08-12 NOTE — Progress Notes (Signed)
Presents today chief complaint of painful elongated toenails.  Painful corns and calluses plantar aspect of the foot.  Objective: Toenails are long thick yellow dystrophic-like mycotic pulses are palpable.  No open lesions or wounds are noted.  Multiple benign skin lesions plantar aspect of the forefoot reactive hyperkeratotic lesions none open no open lesions or wounds.  No signs of infection.  Assessment: Pain in limb secondary to onychomycosis.  Benign skin lesions  Plan: Debridement of toenails 1 through 5 bilateral.  Debridement of benign skin lesions.

## 2020-08-16 DIAGNOSIS — H9202 Otalgia, left ear: Secondary | ICD-10-CM | POA: Diagnosis not present

## 2020-08-16 DIAGNOSIS — H409 Unspecified glaucoma: Secondary | ICD-10-CM | POA: Diagnosis not present

## 2020-09-01 DIAGNOSIS — H409 Unspecified glaucoma: Secondary | ICD-10-CM | POA: Diagnosis not present

## 2020-09-01 DIAGNOSIS — E890 Postprocedural hypothyroidism: Secondary | ICD-10-CM | POA: Diagnosis not present

## 2020-09-01 DIAGNOSIS — E1169 Type 2 diabetes mellitus with other specified complication: Secondary | ICD-10-CM | POA: Diagnosis not present

## 2020-09-01 DIAGNOSIS — E782 Mixed hyperlipidemia: Secondary | ICD-10-CM | POA: Diagnosis not present

## 2020-09-01 DIAGNOSIS — I10 Essential (primary) hypertension: Secondary | ICD-10-CM | POA: Diagnosis not present

## 2020-09-06 ENCOUNTER — Encounter: Payer: Self-pay | Admitting: Internal Medicine

## 2020-09-06 ENCOUNTER — Other Ambulatory Visit: Payer: Self-pay

## 2020-09-06 ENCOUNTER — Ambulatory Visit: Payer: PPO | Admitting: Internal Medicine

## 2020-09-06 VITALS — BP 130/77 | HR 60 | Ht 63.0 in | Wt 199.2 lb

## 2020-09-06 DIAGNOSIS — I517 Cardiomegaly: Secondary | ICD-10-CM | POA: Diagnosis not present

## 2020-09-06 DIAGNOSIS — E785 Hyperlipidemia, unspecified: Secondary | ICD-10-CM

## 2020-09-06 DIAGNOSIS — R9431 Abnormal electrocardiogram [ECG] [EKG]: Secondary | ICD-10-CM | POA: Diagnosis not present

## 2020-09-06 DIAGNOSIS — I10 Essential (primary) hypertension: Secondary | ICD-10-CM

## 2020-09-06 NOTE — Patient Instructions (Signed)
Medication Instructions:  No Changes In Medications at this time.  *If you need a refill on your cardiac medications before your next appointment, please call your pharmacy*  Follow-Up: At [facility], you and your health needs are our priority.  As part of our continuing mission to provide you with exceptional heart care, we have created designated Provider Care Teams.  These Care Teams include your primary Cardiologist (physician) and Advanced Practice Providers (APPs -  Physician Assistants and Nurse Practitioners) who all work together to provide you with the care you need, when you need it.  We recommend signing up for the patient portal called "MyChart".  Sign up information is provided on this After Visit Summary.  MyChart is used to connect with patients for Virtual Visits (Telemedicine).  Patients are able to view lab/test results, encounter notes, upcoming appointments, etc.  Non-urgent messages can be sent to your provider as well.   To learn more about what you can do with MyChart, go to [email or web address].    Your next appointment:   1 year(s)  The format for your next appointment:   In Person  Provider:   Cherlynn Kaiser, MD

## 2020-09-06 NOTE — Progress Notes (Signed)
Cardiology Office Note:    Date:  09/06/2020   ID:  Melissa Davenport, DOB 1940-06-23, MRN [id number]  PCP:  Hulan Fess, MD  Cardiologist:  No primary care provider on file.  Electrophysiologist:  None   Referring MD: Hulan Fess, MD   Chief Complaint/Reason for Referral: LVH  History of Present Illness:    Melissa Davenport is a 80 y.o. female with a history of HTN, HLD, Graves disease s/p radioactive iodine ablation 07/08/09 with post ablative hypothyroidism, Type 2 diabetes.   Her cardiovascular workup has included an episode of perioperative hypotension during induction of an axillary block for rotator cuff surgery. She was seen by my colleague Dr. Wynonia Lawman and echo showed moderate LVH by report, and cardiac catheterization showed nonobstructive CAD.    BP is stable overall, continues on losartan HCTZ 50-12.5 mg and metoprolol succinate 50 mg daily.    Lipids goal <70, currently LDL is 80. On pravastatin.   We performed an echo 04/2020, mild LVH, unable to get strain.   She feels well overall. Very mild chest pressure when sitting not with activity. We will watch this, she is not concerned and says she almost didn't mention it.  Past Medical History:  Diagnosis Date  . Allergic rhinitis   . Allergy    allergic rhinitis  . Basal cell carcinoma   . Cancer ([place])   . Cataract   . Diabetes mellitus without complication ([place])   . Glaucoma   . Graves disease   . Hyperlipidemia   . Hypertension   . Kidney stones   . LVH (left ventricular hypertrophy) 2017   Mild to moderate  . Meniere's disease   . Migraines   . Mixed dyslipidemia   . Osteopenia   . Thyroid disease   . Vitamin D deficiency     Past Surgical History:  Procedure Laterality Date  . ABDOMINAL HYSTERECTOMY    . CARDIAC CATHETERIZATION N/A 01/31/2016   Procedure: Left Heart Cath and Coronary Angiography;  Surgeon: Burnell Blanks, MD;  Location: [place] [facility];  Service: Cardiovascular;  Laterality:  N/A;  . CATARACT EXTRACTION Bilateral   . COLONOSCOPY  2008 & 2014  . EYE SURGERY    . FLEXIBLE SIGMOIDOSCOPY    . left ankle surgery    . left foot surgery    . SPINE SURGERY      Current Medications: Current Meds  Medication Sig  . aspirin 81 MG tablet Take 81 mg by mouth daily.  . AZELASTINE-FLUTICASONE-NACL NA Place into the nose.  . bimatoprost (LUMIGAN) 0.01 % SOLN Place 1 drop into both eyes at bedtime.   . cetirizine (ZYRTEC) 10 MG tablet Take 1 tablet by mouth daily as needed for allergies.   . Cholecalciferol 25 MCG (1000 UT) tablet Take 1 tablet by mouth daily.  . dorzolamide-timolol (COSOPT) 22.3-6.8 MG/ML ophthalmic solution Place 1 drop into both eyes 2 (two) times daily.  . fluticasone (FLONASE) 50 MCG/ACT nasal spray Place 1 spray into both nostrils daily as needed for allergies. Use only needed  . levothyroxine (SYNTHROID, LEVOTHROID) 100 MCG tablet Take 100 mcg by mouth daily before breakfast.  . losartan-hydrochlorothiazide (HYZAAR) 50-12.5 MG per tablet Take 1 tablet by mouth daily.  . metoprolol succinate (TOPROL-XL) 50 MG 24 hr tablet Take 50 mg by mouth daily. Take with or immediately following a meal.  . Multiple Vitamin (MULTI-VITAMINS) TABS Take 1 tablet by mouth daily.  . niacin 500 MG tablet Take 1 tablet by mouth daily.  Marland Kitchen  Omega-3 1000 MG CAPS Take 3 tablets by mouth daily.  . pravastatin (PRAVACHOL) 20 MG tablet Take 20 mg by mouth daily.     Allergies:   Brimonidine, Flagyl [metronidazole], Ketek [telithromycin], and Levaquin [levofloxacin]   Social History   Tobacco Use  . Smoking status: Never Smoker  . Smokeless tobacco: Never Used  Vaping Use  . Vaping Use: Never used  Substance Use Topics  . Alcohol use: No  . Drug use: No     Family History: The patient's family history includes CVA in her sister; Diabetes in her sister; Emphysema in her father; Heart disease (age of onset: 63) in her father; Heart disease (age of onset: 28) in her  mother; Hypothyroidism in her daughter.  ROS:   Please see the history of present illness.    All other systems reviewed and are negative.  EKGs/Labs/Other Studies Reviewed:    The following studies were reviewed today:  EKG: Normal sinus rhythm, ST-T wave abnormalities no change from prior  Recent Labs: No results found for requested labs within last 8760 hours.  Recent Lipid Panel No results found for: CHOL, TRIG, HDL, CHOLHDL, VLDL, LDLCALC, LDLDIRECT  Physical Exam:    VS:  BP 130/77   Pulse 60   Ht 5\' 3"  (1.6 m)   Wt 199 lb 3.2 oz (90.4 kg)   SpO2 94%   BMI 35.29 kg/m     Wt Readings from Last 5 Encounters:  09/06/20 199 lb 3.2 oz (90.4 kg)  03/17/20 204 lb (92.5 kg)  01/31/16 200 lb (90.7 kg)  12/16/14 195 lb 4.8 oz (88.6 kg)  11/13/13 200 lb (90.7 kg)    Constitutional: No acute distress Eyes: sclera non-icteric, normal conjunctiva and lids ENMT: normal dentition, moist mucous membranes Cardiovascular: regular rhythm, normal rate, no murmurs. S1 and S2 normal. Radial pulses normal bilaterally. No jugular venous distention.  Respiratory: clear to auscultation bilaterally GI : normal bowel sounds, soft and nontender. No distention.   MSK: extremities warm, well perfused. No edema.  NEURO: grossly nonfocal exam, moves all extremities. PSYCH: alert and oriented x 3, normal mood and affect.   ASSESSMENT:    1. Left ventricular hypertrophy   2. Abnormal EKG   3. Essential hypertension   4. Hyperlipidemia, unspecified hyperlipidemia type    PLAN:    Left ventricular hypertrophy - Plan: EKG 12-Lead Abnormal EKG - Plan: EKG 12-Lead -Mild LVH on last echo.  ECG is stable from prior with no definite findings on echo to suggest a structural abnormality.  She described very mild chest pressure at rest.  She would like to observe this since she is having no exertional symptoms and had nonobstructive CAD on a prior cath.  I think this is reasonable.  I have asked her to  contact our office if she has worsening chest pressure and we will schedule a stress test at that time if indicated.  Essential hypertension - Plan: EKG 12-Lead Blood pressure overall stable, I reviewed home blood pressure log, continue losartan HCTZ 50-12 and half milligrams daily and metoprolol succinate 50 mg daily.  Hyperlipidemia, unspecified hyperlipidemia type - Plan: EKG 12-Lead -Overall well controlled with an LDL of 80 and triglycerides of 103 on pravastatin 20 mg daily, continue.  Total time of encounter: 31 minutes total time of encounter, including 20 minutes spent in face-to-face patient care on the date of this encounter. This time includes coordination of care and counseling regarding above mentioned problem list. Remainder of non-face-to-face time  involved reviewing chart documents/testing relevant to the patient encounter and documentation in the medical record. I have independently reviewed documentation from referring provider.   Cherlynn Kaiser, MD, [facility]    Medication Adjustments/Labs and Tests Ordered: Current medicines are reviewed at length with the patient today.  Concerns regarding medicines are outlined above.   Orders Placed This Encounter  Procedures  . EKG 12-Lead    No orders of the defined types were placed in this encounter.   Patient Instructions  Medication Instructions:  No Changes In Medications at this time.  *If you need a refill on your cardiac medications before your next appointment, please call your pharmacy*  Follow-Up: At [facility], you and your health needs are our priority.  As part of our continuing mission to provide you with exceptional heart care, we have created designated Provider Care Teams.  These Care Teams include your primary Cardiologist (physician) and Advanced Practice Providers (APPs -  Physician Assistants and Nurse Practitioners) who all work together to provide you with the care you  need, when you need it.  We recommend signing up for the patient portal called "MyChart".  Sign up information is provided on this After Visit Summary.  MyChart is used to connect with patients for Virtual Visits (Telemedicine).  Patients are able to view lab/test results, encounter notes, upcoming appointments, etc.  Non-urgent messages can be sent to your provider as well.   To learn more about what you can do with MyChart, go to [email or web address].    Your next appointment:   1 year(s)  The format for your next appointment:   In Person  Provider:   Cherlynn Kaiser, MD

## 2020-09-29 ENCOUNTER — Ambulatory Visit: Payer: PPO | Attending: Internal Medicine

## 2020-09-29 DIAGNOSIS — Z23 Encounter for immunization: Secondary | ICD-10-CM

## 2020-09-29 NOTE — Progress Notes (Signed)
   Covid-19 Vaccination Clinic  Name:  Melissa Davenport    MRN: [id number] DOB: 07-27-1940  09/29/2020  [name] was observed post Covid-19 immunization for 15 minutes without incident. She was provided with Vaccine Information Sheet and instruction to access the V-Safe system.   [name] was instructed to call 911 with any severe reactions post vaccine: Marland Kitchen Difficulty breathing  . Swelling of face and throat  . A fast heartbeat  . A bad rash all over body  . Dizziness and weakness   Immunizations Administered    Name Date Dose VIS Date Route   PFIZER Comrnaty(Gray TOP) Covid-19 Vaccine 09/29/2020 10:50 AM 0.3 mL 05/13/2020 Intramuscular   Manufacturer: [employer]   Lot: [id number]   NDC: [phone]

## 2020-10-04 ENCOUNTER — Other Ambulatory Visit (HOSPITAL_COMMUNITY): Payer: Self-pay

## 2020-10-04 MED ORDER — COVID-19 MRNA VAC-TRIS(PFIZER) 30 MCG/0.3ML IM SUSP
INTRAMUSCULAR | 0 refills | Status: DC
  Filled 2020-10-04: qty 0.3, 17d supply, fill #0

## 2020-10-22 DIAGNOSIS — H409 Unspecified glaucoma: Secondary | ICD-10-CM | POA: Diagnosis not present

## 2020-10-22 DIAGNOSIS — E782 Mixed hyperlipidemia: Secondary | ICD-10-CM | POA: Diagnosis not present

## 2020-10-22 DIAGNOSIS — E1169 Type 2 diabetes mellitus with other specified complication: Secondary | ICD-10-CM | POA: Diagnosis not present

## 2020-10-22 DIAGNOSIS — I10 Essential (primary) hypertension: Secondary | ICD-10-CM | POA: Diagnosis not present

## 2020-10-22 DIAGNOSIS — E890 Postprocedural hypothyroidism: Secondary | ICD-10-CM | POA: Diagnosis not present

## 2020-11-23 ENCOUNTER — Ambulatory Visit: Payer: PPO | Admitting: Podiatry

## 2020-11-23 ENCOUNTER — Encounter: Payer: Self-pay | Admitting: Podiatry

## 2020-11-23 ENCOUNTER — Other Ambulatory Visit: Payer: Self-pay

## 2020-11-23 DIAGNOSIS — D2372 Other benign neoplasm of skin of left lower limb, including hip: Secondary | ICD-10-CM

## 2020-11-23 DIAGNOSIS — B351 Tinea unguium: Secondary | ICD-10-CM | POA: Diagnosis not present

## 2020-11-23 DIAGNOSIS — D2371 Other benign neoplasm of skin of right lower limb, including hip: Secondary | ICD-10-CM

## 2020-11-23 DIAGNOSIS — M79676 Pain in unspecified toe(s): Secondary | ICD-10-CM

## 2020-11-23 DIAGNOSIS — E119 Type 2 diabetes mellitus without complications: Secondary | ICD-10-CM | POA: Diagnosis not present

## 2020-11-23 NOTE — Progress Notes (Signed)
She presents today chief complaint of painful elongated toenails and calluses bilateral.  Objective: Toenails are long thick yellow dystrophic Lee mycotic painful on palpation.  Multiple reactive hyper keratomas plantar aspect of the bilateral foot no open lesions or wounds are noted.  Assessment: Pain limb secondary onychomycosis and benign skin lesions.  Plan: Debridement of all benign skin lesions.  Debridement of toenails 1 through 5 bilateral.

## 2020-11-29 DIAGNOSIS — E782 Mixed hyperlipidemia: Secondary | ICD-10-CM | POA: Diagnosis not present

## 2020-11-29 DIAGNOSIS — H409 Unspecified glaucoma: Secondary | ICD-10-CM | POA: Diagnosis not present

## 2020-11-29 DIAGNOSIS — I10 Essential (primary) hypertension: Secondary | ICD-10-CM | POA: Diagnosis not present

## 2020-11-29 DIAGNOSIS — E890 Postprocedural hypothyroidism: Secondary | ICD-10-CM | POA: Diagnosis not present

## 2020-11-29 DIAGNOSIS — E1169 Type 2 diabetes mellitus with other specified complication: Secondary | ICD-10-CM | POA: Diagnosis not present

## 2020-12-28 DIAGNOSIS — H409 Unspecified glaucoma: Secondary | ICD-10-CM | POA: Diagnosis not present

## 2020-12-28 DIAGNOSIS — I10 Essential (primary) hypertension: Secondary | ICD-10-CM | POA: Diagnosis not present

## 2020-12-28 DIAGNOSIS — E782 Mixed hyperlipidemia: Secondary | ICD-10-CM | POA: Diagnosis not present

## 2020-12-28 DIAGNOSIS — E1169 Type 2 diabetes mellitus with other specified complication: Secondary | ICD-10-CM | POA: Diagnosis not present

## 2020-12-28 DIAGNOSIS — E890 Postprocedural hypothyroidism: Secondary | ICD-10-CM | POA: Diagnosis not present

## 2020-12-29 DIAGNOSIS — B079 Viral wart, unspecified: Secondary | ICD-10-CM | POA: Diagnosis not present

## 2020-12-29 DIAGNOSIS — Z6836 Body mass index (BMI) 36.0-36.9, adult: Secondary | ICD-10-CM | POA: Diagnosis not present

## 2020-12-29 DIAGNOSIS — L819 Disorder of pigmentation, unspecified: Secondary | ICD-10-CM | POA: Diagnosis not present

## 2021-02-02 DIAGNOSIS — D225 Melanocytic nevi of trunk: Secondary | ICD-10-CM | POA: Diagnosis not present

## 2021-02-02 DIAGNOSIS — L821 Other seborrheic keratosis: Secondary | ICD-10-CM | POA: Diagnosis not present

## 2021-02-02 DIAGNOSIS — D485 Neoplasm of uncertain behavior of skin: Secondary | ICD-10-CM | POA: Diagnosis not present

## 2021-02-02 DIAGNOSIS — L538 Other specified erythematous conditions: Secondary | ICD-10-CM | POA: Diagnosis not present

## 2021-02-02 DIAGNOSIS — D1801 Hemangioma of skin and subcutaneous tissue: Secondary | ICD-10-CM | POA: Diagnosis not present

## 2021-02-02 DIAGNOSIS — L918 Other hypertrophic disorders of the skin: Secondary | ICD-10-CM | POA: Diagnosis not present

## 2021-02-16 ENCOUNTER — Other Ambulatory Visit (HOSPITAL_COMMUNITY): Payer: Self-pay

## 2021-02-16 MED ORDER — VALSARTAN 160 MG PO TABS
160.0000 mg | ORAL_TABLET | Freq: Every day | ORAL | 11 refills | Status: DC
  Filled 2021-02-16: qty 30, 30d supply, fill #0

## 2021-02-24 ENCOUNTER — Encounter: Payer: Self-pay | Admitting: Podiatry

## 2021-02-24 ENCOUNTER — Other Ambulatory Visit: Payer: Self-pay

## 2021-02-24 ENCOUNTER — Ambulatory Visit: Payer: PPO | Admitting: Podiatry

## 2021-02-24 DIAGNOSIS — D2371 Other benign neoplasm of skin of right lower limb, including hip: Secondary | ICD-10-CM

## 2021-02-24 DIAGNOSIS — M79676 Pain in unspecified toe(s): Secondary | ICD-10-CM | POA: Diagnosis not present

## 2021-02-24 DIAGNOSIS — E119 Type 2 diabetes mellitus without complications: Secondary | ICD-10-CM

## 2021-02-24 DIAGNOSIS — D2372 Other benign neoplasm of skin of left lower limb, including hip: Secondary | ICD-10-CM

## 2021-02-24 DIAGNOSIS — B351 Tinea unguium: Secondary | ICD-10-CM

## 2021-02-24 NOTE — Progress Notes (Signed)
She presents today chief complaint of painful elongated toenails bilateral.  Objective: Vitals are stable and oriented x3.  He has a long thick yellow dystrophic Lee mycotic painful palpation as well as debridement.  Assessment: Pain in limb secondary to onychomycosis.  Plan: Debridement of benign skin lesions and debridement of painful nails bilateral.

## 2021-02-25 DIAGNOSIS — E2839 Other primary ovarian failure: Secondary | ICD-10-CM | POA: Diagnosis not present

## 2021-02-25 DIAGNOSIS — E890 Postprocedural hypothyroidism: Secondary | ICD-10-CM | POA: Diagnosis not present

## 2021-02-25 DIAGNOSIS — E782 Mixed hyperlipidemia: Secondary | ICD-10-CM | POA: Diagnosis not present

## 2021-02-25 DIAGNOSIS — E1165 Type 2 diabetes mellitus with hyperglycemia: Secondary | ICD-10-CM | POA: Diagnosis not present

## 2021-02-25 DIAGNOSIS — Z23 Encounter for immunization: Secondary | ICD-10-CM | POA: Diagnosis not present

## 2021-02-25 DIAGNOSIS — H409 Unspecified glaucoma: Secondary | ICD-10-CM | POA: Diagnosis not present

## 2021-02-25 DIAGNOSIS — R809 Proteinuria, unspecified: Secondary | ICD-10-CM | POA: Diagnosis not present

## 2021-02-25 DIAGNOSIS — Z6836 Body mass index (BMI) 36.0-36.9, adult: Secondary | ICD-10-CM | POA: Diagnosis not present

## 2021-02-25 DIAGNOSIS — M899 Disorder of bone, unspecified: Secondary | ICD-10-CM | POA: Diagnosis not present

## 2021-02-25 DIAGNOSIS — I10 Essential (primary) hypertension: Secondary | ICD-10-CM | POA: Diagnosis not present

## 2021-02-25 DIAGNOSIS — Z0000 Encounter for general adult medical examination without abnormal findings: Secondary | ICD-10-CM | POA: Diagnosis not present

## 2021-02-28 ENCOUNTER — Other Ambulatory Visit: Payer: Self-pay | Admitting: Home Modifications

## 2021-02-28 DIAGNOSIS — E2839 Other primary ovarian failure: Secondary | ICD-10-CM

## 2021-03-10 ENCOUNTER — Other Ambulatory Visit: Payer: Self-pay | Admitting: Home Modifications

## 2021-03-10 DIAGNOSIS — Z1231 Encounter for screening mammogram for malignant neoplasm of breast: Secondary | ICD-10-CM

## 2021-03-17 ENCOUNTER — Ambulatory Visit: Payer: PPO

## 2021-03-28 DIAGNOSIS — L905 Scar conditions and fibrosis of skin: Secondary | ICD-10-CM | POA: Diagnosis not present

## 2021-03-28 DIAGNOSIS — C44519 Basal cell carcinoma of skin of other part of trunk: Secondary | ICD-10-CM | POA: Diagnosis not present

## 2021-04-05 ENCOUNTER — Other Ambulatory Visit: Payer: Self-pay

## 2021-04-05 ENCOUNTER — Encounter: Payer: PPO | Attending: Home Modifications | Admitting: Dietician

## 2021-04-05 DIAGNOSIS — E119 Type 2 diabetes mellitus without complications: Secondary | ICD-10-CM

## 2021-04-12 ENCOUNTER — Encounter: Payer: Self-pay | Admitting: Dietician

## 2021-04-12 ENCOUNTER — Encounter: Payer: PPO | Admitting: Dietician

## 2021-04-12 ENCOUNTER — Other Ambulatory Visit: Payer: Self-pay

## 2021-04-12 DIAGNOSIS — E119 Type 2 diabetes mellitus without complications: Secondary | ICD-10-CM

## 2021-04-12 NOTE — Progress Notes (Signed)
Patient was seen on 04/05/21 for the first of a series of three diabetes self-management courses at the [facility].  Patient Education Plan per assessed needs and concerns is to attend three course education program for Diabetes Self Management Education.  The following learning objectives were met by the patient during this class: Describe diabetes, types of diabetes and pathophysiology State some common risk factors for diabetes Defines the role of glucose and insulin Describe the relationship between diabetes and cardiovascular and other risks State the members of the Healthcare Team States the rationale for glucose monitoring and when to test State their individual Target Range State the importance of logging glucose readings and how to interpret the readings Identifies A1C target Explain the correlation between A1c and eAG values State symptoms and treatment of high blood glucose and low blood glucose Explain proper technique for glucose testing and identify proper sharps disposal  Handouts given during class include: How to Thrive:  A Guide for Your Journey with Diabetes by the ADA Meal Plan Card and carbohydrate content list Dietary intake form Low Sodium Flavoring Tips Types of Fats Dining Out Label reading Snack list The diabetes portion plate Diabetes Resources A1c to eAG Conversion Chart Blood Glucose Log Diabetes Recommended Care Schedule Support Group Diabetes Success Plan Core Class Satisfaction Survey   Follow-Up Plan: Attend core 2   

## 2021-04-13 ENCOUNTER — Ambulatory Visit: Payer: PPO | Attending: Internal Medicine

## 2021-04-13 DIAGNOSIS — E1169 Type 2 diabetes mellitus with other specified complication: Secondary | ICD-10-CM | POA: Diagnosis not present

## 2021-04-13 DIAGNOSIS — H409 Unspecified glaucoma: Secondary | ICD-10-CM | POA: Diagnosis not present

## 2021-04-13 DIAGNOSIS — Z23 Encounter for immunization: Secondary | ICD-10-CM

## 2021-04-13 DIAGNOSIS — E890 Postprocedural hypothyroidism: Secondary | ICD-10-CM | POA: Diagnosis not present

## 2021-04-13 DIAGNOSIS — E1165 Type 2 diabetes mellitus with hyperglycemia: Secondary | ICD-10-CM | POA: Diagnosis not present

## 2021-04-13 DIAGNOSIS — I10 Essential (primary) hypertension: Secondary | ICD-10-CM | POA: Diagnosis not present

## 2021-04-13 DIAGNOSIS — E782 Mixed hyperlipidemia: Secondary | ICD-10-CM | POA: Diagnosis not present

## 2021-04-13 NOTE — Progress Notes (Signed)
   Covid-19 Vaccination Clinic  Name:  Melissa Davenport    MRN: [id number] DOB: [birth year]  04/13/2021  Melissa Davenport was observed post Covid-19 immunization for 15 minutes without incident. She was provided with Vaccine Information Sheet and instruction to access the V-Safe system.   Melissa Davenport was instructed to call 911 with any severe reactions post vaccine: Difficulty breathing  Swelling of face and throat  A fast heartbeat  A bad rash all over body  Dizziness and weakness   Immunizations Administered     Name Date Dose VIS Date Route   Pfizer Covid-19 Vaccine Bivalent Booster 04/13/2021  9:56 AM 0.3 mL 02/02/2021 Intramuscular   Manufacturer: [place]   Lot: [id number]   [place]: [phone]

## 2021-04-19 ENCOUNTER — Encounter: Payer: Self-pay | Admitting: Dietician

## 2021-04-19 NOTE — Progress Notes (Signed)
Patient was seen on 04/12/2021 for the second of a series of three diabetes self-management courses at the [facility]. The following learning objectives were met by the patient during this class:  Describe the role of different macronutrients on glucose Explain how carbohydrates affect blood glucose State what foods contain the most carbohydrates Demonstrate carbohydrate counting Demonstrate how to read Nutrition Facts food label Describe effects of various fats on heart health Describe the importance of good nutrition for health and healthy eating strategies Describe techniques for managing your shopping, cooking and meal planning List strategies to follow meal plan when dining out Describe the effects of alcohol on glucose and how to use it safely  Goals:  Follow Diabetes Meal Plan as instructed  Aim to spread carbs evenly throughout the day  Aim for 3 meals per day and snacks as needed Include lean protein foods to meals/snacks  Monitor glucose levels as instructed by your doctor   Follow-Up Plan: Attend Core 3 Work towards following your personal food plan.

## 2021-04-20 DIAGNOSIS — I10 Essential (primary) hypertension: Secondary | ICD-10-CM | POA: Diagnosis not present

## 2021-04-20 DIAGNOSIS — Z6835 Body mass index (BMI) 35.0-35.9, adult: Secondary | ICD-10-CM | POA: Diagnosis not present

## 2021-04-20 DIAGNOSIS — E1169 Type 2 diabetes mellitus with other specified complication: Secondary | ICD-10-CM | POA: Diagnosis not present

## 2021-04-20 DIAGNOSIS — E782 Mixed hyperlipidemia: Secondary | ICD-10-CM | POA: Diagnosis not present

## 2021-04-20 DIAGNOSIS — R809 Proteinuria, unspecified: Secondary | ICD-10-CM | POA: Diagnosis not present

## 2021-05-05 ENCOUNTER — Ambulatory Visit: Payer: PPO | Admitting: Dietician

## 2021-05-10 ENCOUNTER — Other Ambulatory Visit: Payer: Self-pay

## 2021-05-10 ENCOUNTER — Encounter: Payer: PPO | Attending: Home Modifications | Admitting: Dietician

## 2021-05-10 ENCOUNTER — Encounter: Payer: Self-pay | Admitting: Dietician

## 2021-05-10 DIAGNOSIS — E119 Type 2 diabetes mellitus without complications: Secondary | ICD-10-CM | POA: Diagnosis not present

## 2021-05-10 NOTE — Progress Notes (Signed)
Patient was seen on 05/10/2021 for the third of a series of three diabetes self-management courses at the [facility]. This was a 1:1 visit due to attendance.  Nutrition was reviewed and questions answered.  State the amount of activity recommended for healthy living Describe activities suitable for individual needs Identify ways to regularly incorporate activity into daily life Identify barriers to activity and ways to over come these barriers Identify diabetes medications being personally used and their primary action for lowering glucose and possible side effects Describe role of stress on blood glucose and develop strategies to address psychosocial issues Identify diabetes complications and ways to prevent them Explain how to manage diabetes during illness Evaluate success in meeting personal goal Establish 2-3 goals that they will plan to diligently work on  Goals:  I will count my carb choices at most meals and snacks I will be active 30 minutes or more 3 times a week  Your patient has identified these potential barriers to change:  Motivation  Your patient has identified their diabetes self-care support plan as  Family Support   Plan:  Follow up with RD in 3 months Attend Support Group as desired

## 2021-05-12 DIAGNOSIS — I10 Essential (primary) hypertension: Secondary | ICD-10-CM | POA: Diagnosis not present

## 2021-05-12 DIAGNOSIS — E1165 Type 2 diabetes mellitus with hyperglycemia: Secondary | ICD-10-CM | POA: Diagnosis not present

## 2021-05-12 DIAGNOSIS — E782 Mixed hyperlipidemia: Secondary | ICD-10-CM | POA: Diagnosis not present

## 2021-05-12 DIAGNOSIS — E669 Obesity, unspecified: Secondary | ICD-10-CM | POA: Diagnosis not present

## 2021-05-12 DIAGNOSIS — Z6834 Body mass index (BMI) 34.0-34.9, adult: Secondary | ICD-10-CM | POA: Diagnosis not present

## 2021-05-23 ENCOUNTER — Ambulatory Visit
Admission: RE | Admit: 2021-05-23 | Discharge: 2021-05-23 | Disposition: A | Discharge status: Home / Self Care | Payer: PPO | Source: Ambulatory Visit | Attending: Home Modifications | Admitting: Home Modifications

## 2021-05-23 DIAGNOSIS — Z1231 Encounter for screening mammogram for malignant neoplasm of breast: Secondary | ICD-10-CM

## 2021-05-26 ENCOUNTER — Ambulatory Visit: Payer: PPO | Admitting: Podiatry

## 2021-06-06 ENCOUNTER — Other Ambulatory Visit: Payer: Self-pay

## 2021-06-06 ENCOUNTER — Encounter: Payer: Self-pay | Admitting: Dietician

## 2021-06-06 ENCOUNTER — Encounter: Payer: PPO | Attending: Home Modifications | Admitting: Dietician

## 2021-06-06 DIAGNOSIS — E119 Type 2 diabetes mellitus without complications: Secondary | ICD-10-CM | POA: Diagnosis not present

## 2021-06-06 NOTE — Progress Notes (Signed)
Appointment start:  1530:   End:  0102 Patient is here today alone.    Patient attended Diabetes Core Classes 1-3 between 04/05/2021 and 05/10/2021 at [facility]. The purpose of the meeting today is to review information learned during those classes as well as review patient application and goals.    What are one or two positive things that you are doing right now to manage your diabetes?  She states that she has not been exercising and verbalized a need to start.  She states that she has been more carefull about her diet but still verbalized a need to improve.  She is checking her blood sugar and fasting readings are 114-131 in the past week. Patient states that she bought a [employer].  What is the hardest part about your diabetes right now, causing you the most concern, or is the most worrisome to you about your diabetes?  Just the fact that she has diabetes but tries not to dwell on it.  She sees how diabetes has effected her sister.  What questions do you have today?  none  Have you participated in any diabetes support group?  no  History:  Type 2 diabetes, HTN A1C:  6.6% 12/2020 Medications include:  metformin Sleep:  "better than it was" Weight:   194 lbs 06/06/2021 198 lbs 04/12/2021 204 lbs 03/17/2020  Blood Glucose:  Fasting:  114-131  2 hours after starting a meal:  not checking  Have you had any low blood sugar readings in the past month?  none  Social History:  Patient lives alone.  She is retired. She drives.    24 hour diet recall: Breakfast:  1 slice [employer] bread with PB, 1/2 glass skim milk Snack:  none Lunch:  1 slice cheese toast Snack:  none Dinner:  [employer] - grilled chicken, 1/2 baked potato with small amount of butter, salad OR home cooked most often :  1 slider on 1/2 bun with lettuce and tomato or chicken, onions, potatoes Snack:  peanuts Beverages:  water, unsweetened tea, black coffee, occasional diet soda, skim  milk   Specific focus but not limited to the following: Review of blood glucose monitoring and interpretation including the recommended target ranges and Hgb A1c.  Review of carbohydrate counting, importance of regularly scheduled meals/snacks, and meal planning to improve quality of diet. Review of the effects of physical activity on glucose levels and long-term glucose control.  Recommended goal of 150 minutes of physical activity/week. Review of patient medications and discussed role of medication on blood glucose and possible side effects. Discussion of strategies to manage stress, psychosocial issues, and other obstacles to diabetes management. Review of short-term complications: hyper- and hypo-glycemia (causes, symptoms, and treatment options) Review of prevention, detection, and treatment of long-term complications.  Discussion of the role of prolonged elevated glucose levels on body systems.  Continuing Goals: Aim to be more active.  30 minutes of exercise most days. Increase your non-starchy vegetables. Add fruit with breakfast and lunch. (Berries or small fruit for example)  Future Follow up:  yearly

## 2021-06-06 NOTE — Patient Instructions (Signed)
Aim to be more active.  30 minutes of exercise most days. Increase your non-starchy vegetables. Add fruit with breakfast and lunch. (Berries or small fruit for example)

## 2021-06-09 ENCOUNTER — Ambulatory Visit: Payer: PPO | Admitting: Podiatry

## 2021-06-09 ENCOUNTER — Encounter: Payer: Self-pay | Admitting: Podiatry

## 2021-06-09 ENCOUNTER — Other Ambulatory Visit: Payer: Self-pay

## 2021-06-09 DIAGNOSIS — M79676 Pain in unspecified toe(s): Secondary | ICD-10-CM

## 2021-06-09 DIAGNOSIS — D2371 Other benign neoplasm of skin of right lower limb, including hip: Secondary | ICD-10-CM

## 2021-06-09 DIAGNOSIS — D2372 Other benign neoplasm of skin of left lower limb, including hip: Secondary | ICD-10-CM

## 2021-06-09 DIAGNOSIS — E119 Type 2 diabetes mellitus without complications: Secondary | ICD-10-CM

## 2021-06-09 DIAGNOSIS — B351 Tinea unguium: Secondary | ICD-10-CM

## 2021-06-09 NOTE — Progress Notes (Signed)
She presents today chief complaint of painful calluses and toenails.  Objective: Pulses remain palpable.  Toenails are thick yellow dystrophic-like mycotic multiple areas of benign skin lesions.  No open lesions or wounds.  Assessment: Pain in limb secondary to onychomycosis and benign skin lesions.  Plan: Debrided benign skin lesions.  Onychomycosis and I will follow-up with her as needed or in 3 months

## 2021-07-20 DIAGNOSIS — E1165 Type 2 diabetes mellitus with hyperglycemia: Secondary | ICD-10-CM | POA: Diagnosis not present

## 2021-07-20 DIAGNOSIS — J309 Allergic rhinitis, unspecified: Secondary | ICD-10-CM | POA: Diagnosis not present

## 2021-07-20 DIAGNOSIS — M899 Disorder of bone, unspecified: Secondary | ICD-10-CM | POA: Diagnosis not present

## 2021-07-20 DIAGNOSIS — I10 Essential (primary) hypertension: Secondary | ICD-10-CM | POA: Diagnosis not present

## 2021-07-20 DIAGNOSIS — E890 Postprocedural hypothyroidism: Secondary | ICD-10-CM | POA: Diagnosis not present

## 2021-07-20 DIAGNOSIS — H409 Unspecified glaucoma: Secondary | ICD-10-CM | POA: Diagnosis not present

## 2021-07-20 DIAGNOSIS — E782 Mixed hyperlipidemia: Secondary | ICD-10-CM | POA: Diagnosis not present

## 2021-07-26 DIAGNOSIS — H1030 Unspecified acute conjunctivitis, unspecified eye: Secondary | ICD-10-CM | POA: Diagnosis not present

## 2021-08-03 DIAGNOSIS — L814 Other melanin hyperpigmentation: Secondary | ICD-10-CM | POA: Diagnosis not present

## 2021-08-03 DIAGNOSIS — Z08 Encounter for follow-up examination after completed treatment for malignant neoplasm: Secondary | ICD-10-CM | POA: Diagnosis not present

## 2021-08-03 DIAGNOSIS — L821 Other seborrheic keratosis: Secondary | ICD-10-CM | POA: Diagnosis not present

## 2021-08-03 DIAGNOSIS — D1801 Hemangioma of skin and subcutaneous tissue: Secondary | ICD-10-CM | POA: Diagnosis not present

## 2021-08-03 DIAGNOSIS — Z85828 Personal history of other malignant neoplasm of skin: Secondary | ICD-10-CM | POA: Diagnosis not present

## 2021-08-08 ENCOUNTER — Ambulatory Visit: Payer: PPO | Admitting: Dietician

## 2021-08-09 DIAGNOSIS — C44519 Basal cell carcinoma of skin of other part of trunk: Secondary | ICD-10-CM | POA: Diagnosis not present

## 2021-08-09 DIAGNOSIS — L989 Disorder of the skin and subcutaneous tissue, unspecified: Secondary | ICD-10-CM | POA: Diagnosis not present

## 2021-08-17 ENCOUNTER — Other Ambulatory Visit: Payer: Self-pay

## 2021-08-17 ENCOUNTER — Ambulatory Visit
Admission: RE | Admit: 2021-08-17 | Discharge: 2021-08-17 | Disposition: A | Discharge status: Home / Self Care | Payer: PPO | Source: Ambulatory Visit | Attending: Home Modifications | Admitting: Home Modifications

## 2021-08-17 DIAGNOSIS — E2839 Other primary ovarian failure: Secondary | ICD-10-CM

## 2021-08-17 DIAGNOSIS — Z780 Asymptomatic menopausal state: Secondary | ICD-10-CM | POA: Diagnosis not present

## 2021-08-17 DIAGNOSIS — M8589 Other specified disorders of bone density and structure, multiple sites: Secondary | ICD-10-CM | POA: Diagnosis not present

## 2021-09-08 ENCOUNTER — Ambulatory Visit: Payer: PPO | Admitting: Podiatry

## 2021-09-13 DIAGNOSIS — L905 Scar conditions and fibrosis of skin: Secondary | ICD-10-CM | POA: Diagnosis not present

## 2021-09-13 DIAGNOSIS — C44519 Basal cell carcinoma of skin of other part of trunk: Secondary | ICD-10-CM | POA: Diagnosis not present

## 2021-09-22 ENCOUNTER — Ambulatory Visit: Payer: PPO | Admitting: Podiatry

## 2021-09-22 ENCOUNTER — Encounter: Payer: Self-pay | Admitting: Podiatry

## 2021-09-22 DIAGNOSIS — D2372 Other benign neoplasm of skin of left lower limb, including hip: Secondary | ICD-10-CM | POA: Diagnosis not present

## 2021-09-22 DIAGNOSIS — E119 Type 2 diabetes mellitus without complications: Secondary | ICD-10-CM

## 2021-09-22 DIAGNOSIS — I517 Cardiomegaly: Secondary | ICD-10-CM | POA: Insufficient documentation

## 2021-09-22 DIAGNOSIS — R809 Proteinuria, unspecified: Secondary | ICD-10-CM | POA: Insufficient documentation

## 2021-09-22 DIAGNOSIS — M79676 Pain in unspecified toe(s): Secondary | ICD-10-CM

## 2021-09-22 DIAGNOSIS — E1169 Type 2 diabetes mellitus with other specified complication: Secondary | ICD-10-CM | POA: Insufficient documentation

## 2021-09-22 DIAGNOSIS — Z6834 Body mass index (BMI) 34.0-34.9, adult: Secondary | ICD-10-CM | POA: Insufficient documentation

## 2021-09-22 DIAGNOSIS — D2371 Other benign neoplasm of skin of right lower limb, including hip: Secondary | ICD-10-CM

## 2021-09-22 DIAGNOSIS — E890 Postprocedural hypothyroidism: Secondary | ICD-10-CM | POA: Insufficient documentation

## 2021-09-22 DIAGNOSIS — M8580 Other specified disorders of bone density and structure, unspecified site: Secondary | ICD-10-CM | POA: Insufficient documentation

## 2021-09-22 DIAGNOSIS — J309 Allergic rhinitis, unspecified: Secondary | ICD-10-CM | POA: Insufficient documentation

## 2021-09-22 DIAGNOSIS — E2839 Other primary ovarian failure: Secondary | ICD-10-CM | POA: Insufficient documentation

## 2021-09-22 DIAGNOSIS — B351 Tinea unguium: Secondary | ICD-10-CM

## 2021-09-22 DIAGNOSIS — E1165 Type 2 diabetes mellitus with hyperglycemia: Secondary | ICD-10-CM | POA: Insufficient documentation

## 2021-09-22 DIAGNOSIS — E782 Mixed hyperlipidemia: Secondary | ICD-10-CM | POA: Insufficient documentation

## 2021-09-22 DIAGNOSIS — J329 Chronic sinusitis, unspecified: Secondary | ICD-10-CM | POA: Insufficient documentation

## 2021-09-22 DIAGNOSIS — E559 Vitamin D deficiency, unspecified: Secondary | ICD-10-CM | POA: Insufficient documentation

## 2021-09-22 NOTE — Progress Notes (Signed)
She presents today chief complaint of painful toenails and calluses. ? ?Objective: Toenails are long thick yellow dystrophic onychomycotic multiple benign skin lesions plantar aspect of the forefoot bilaterally.  No open lesions or wounds are noted.  Pulses are palpable.  Some dry xerotic skin is noted. ? ?Assessment: Pain in limb secondary to onychomycosis and benign skin lesions. ? ?Plan: Debridement of benign skin lesions debridement of nails 1 through 5 bilaterally.  Follow-up with her in 3 months. ?

## 2021-10-29 DIAGNOSIS — R350 Frequency of micturition: Secondary | ICD-10-CM | POA: Diagnosis not present

## 2021-10-29 DIAGNOSIS — R300 Dysuria: Secondary | ICD-10-CM | POA: Diagnosis not present

## 2021-10-29 DIAGNOSIS — R319 Hematuria, unspecified: Secondary | ICD-10-CM | POA: Diagnosis not present

## 2021-10-29 DIAGNOSIS — R3915 Urgency of urination: Secondary | ICD-10-CM | POA: Diagnosis not present

## 2021-10-29 DIAGNOSIS — N390 Urinary tract infection, site not specified: Secondary | ICD-10-CM | POA: Diagnosis not present

## 2021-12-22 ENCOUNTER — Ambulatory Visit: Payer: PPO | Admitting: Podiatry

## 2021-12-22 DIAGNOSIS — E119 Type 2 diabetes mellitus without complications: Secondary | ICD-10-CM

## 2021-12-22 DIAGNOSIS — M79676 Pain in unspecified toe(s): Secondary | ICD-10-CM | POA: Diagnosis not present

## 2021-12-22 DIAGNOSIS — D2371 Other benign neoplasm of skin of right lower limb, including hip: Secondary | ICD-10-CM

## 2021-12-22 DIAGNOSIS — D2372 Other benign neoplasm of skin of left lower limb, including hip: Secondary | ICD-10-CM | POA: Diagnosis not present

## 2021-12-22 DIAGNOSIS — B351 Tinea unguium: Secondary | ICD-10-CM

## 2021-12-22 NOTE — Progress Notes (Signed)
She presents today chief complaint of painful elongated toenails and painful calluses.  Objective: Vital signs are stable she alert and oriented x3.  Pulses are palpable.  Toenails are long thick yellow dystrophic Lee mycotic multiple reactive hyperkeratotic lesions were debrided today.  Assessment: Pain in limb secondary onychomycosis and benign skin lesions.  Plan: Debrided benign skin lesion debrided nails 1 through 5 bilateral.

## 2022-01-19 DIAGNOSIS — E782 Mixed hyperlipidemia: Secondary | ICD-10-CM | POA: Diagnosis not present

## 2022-01-19 DIAGNOSIS — E890 Postprocedural hypothyroidism: Secondary | ICD-10-CM | POA: Diagnosis not present

## 2022-01-19 DIAGNOSIS — I10 Essential (primary) hypertension: Secondary | ICD-10-CM | POA: Diagnosis not present

## 2022-01-19 DIAGNOSIS — E1169 Type 2 diabetes mellitus with other specified complication: Secondary | ICD-10-CM | POA: Diagnosis not present

## 2022-01-27 DIAGNOSIS — N3001 Acute cystitis with hematuria: Secondary | ICD-10-CM | POA: Diagnosis not present

## 2022-01-27 DIAGNOSIS — R410 Disorientation, unspecified: Secondary | ICD-10-CM | POA: Diagnosis not present

## 2022-01-27 DIAGNOSIS — Z03818 Encounter for observation for suspected exposure to other biological agents ruled out: Secondary | ICD-10-CM | POA: Diagnosis not present

## 2022-01-27 DIAGNOSIS — R52 Pain, unspecified: Secondary | ICD-10-CM | POA: Diagnosis not present

## 2022-01-27 DIAGNOSIS — J0110 Acute frontal sinusitis, unspecified: Secondary | ICD-10-CM | POA: Diagnosis not present

## 2022-01-28 ENCOUNTER — Other Ambulatory Visit: Payer: Self-pay

## 2022-01-28 ENCOUNTER — Encounter (HOSPITAL_BASED_OUTPATIENT_CLINIC_OR_DEPARTMENT_OTHER): Payer: Self-pay

## 2022-01-28 ENCOUNTER — Encounter (HOSPITAL_COMMUNITY): Payer: Self-pay

## 2022-01-28 ENCOUNTER — Emergency Department (HOSPITAL_BASED_OUTPATIENT_CLINIC_OR_DEPARTMENT_OTHER): Payer: PPO

## 2022-01-28 ENCOUNTER — Inpatient Hospital Stay (HOSPITAL_BASED_OUTPATIENT_CLINIC_OR_DEPARTMENT_OTHER)
Admission: EM | Admit: 2022-01-28 | Discharge: 2022-01-31 | DRG: 872 | Disposition: A | Discharge status: Home / Self Care | Payer: PPO | Attending: Internal Medicine | Admitting: Internal Medicine

## 2022-01-28 DIAGNOSIS — Z6834 Body mass index (BMI) 34.0-34.9, adult: Secondary | ICD-10-CM | POA: Diagnosis not present

## 2022-01-28 DIAGNOSIS — I10 Essential (primary) hypertension: Secondary | ICD-10-CM | POA: Diagnosis not present

## 2022-01-28 DIAGNOSIS — B999 Unspecified infectious disease: Secondary | ICD-10-CM | POA: Diagnosis not present

## 2022-01-28 DIAGNOSIS — E559 Vitamin D deficiency, unspecified: Secondary | ICD-10-CM | POA: Diagnosis not present

## 2022-01-28 DIAGNOSIS — Z823 Family history of stroke: Secondary | ICD-10-CM

## 2022-01-28 DIAGNOSIS — E890 Postprocedural hypothyroidism: Secondary | ICD-10-CM | POA: Diagnosis present

## 2022-01-28 DIAGNOSIS — Z6835 Body mass index (BMI) 35.0-35.9, adult: Secondary | ICD-10-CM | POA: Diagnosis not present

## 2022-01-28 DIAGNOSIS — Z833 Family history of diabetes mellitus: Secondary | ICD-10-CM

## 2022-01-28 DIAGNOSIS — N390 Urinary tract infection, site not specified: Secondary | ICD-10-CM | POA: Diagnosis not present

## 2022-01-28 DIAGNOSIS — E669 Obesity, unspecified: Secondary | ICD-10-CM | POA: Diagnosis present

## 2022-01-28 DIAGNOSIS — N12 Tubulo-interstitial nephritis, not specified as acute or chronic: Secondary | ICD-10-CM | POA: Diagnosis not present

## 2022-01-28 DIAGNOSIS — E782 Mixed hyperlipidemia: Secondary | ICD-10-CM | POA: Diagnosis present

## 2022-01-28 DIAGNOSIS — Z825 Family history of asthma and other chronic lower respiratory diseases: Secondary | ICD-10-CM

## 2022-01-28 DIAGNOSIS — N200 Calculus of kidney: Secondary | ICD-10-CM | POA: Diagnosis not present

## 2022-01-28 DIAGNOSIS — N10 Acute tubulo-interstitial nephritis: Secondary | ICD-10-CM | POA: Diagnosis not present

## 2022-01-28 DIAGNOSIS — Z8249 Family history of ischemic heart disease and other diseases of the circulatory system: Secondary | ICD-10-CM | POA: Diagnosis not present

## 2022-01-28 DIAGNOSIS — Z7984 Long term (current) use of oral hypoglycemic drugs: Secondary | ICD-10-CM | POA: Diagnosis not present

## 2022-01-28 DIAGNOSIS — H40003 Preglaucoma, unspecified, bilateral: Secondary | ICD-10-CM | POA: Diagnosis present

## 2022-01-28 DIAGNOSIS — E0500 Thyrotoxicosis with diffuse goiter without thyrotoxic crisis or storm: Secondary | ICD-10-CM | POA: Diagnosis not present

## 2022-01-28 DIAGNOSIS — Z90710 Acquired absence of both cervix and uterus: Secondary | ICD-10-CM | POA: Diagnosis not present

## 2022-01-28 DIAGNOSIS — A419 Sepsis, unspecified organism: Principal | ICD-10-CM | POA: Diagnosis present

## 2022-01-28 DIAGNOSIS — R9431 Abnormal electrocardiogram [ECG] [EKG]: Secondary | ICD-10-CM | POA: Diagnosis not present

## 2022-01-28 DIAGNOSIS — Z85828 Personal history of other malignant neoplasm of skin: Secondary | ICD-10-CM

## 2022-01-28 DIAGNOSIS — E1169 Type 2 diabetes mellitus with other specified complication: Secondary | ICD-10-CM | POA: Diagnosis not present

## 2022-01-28 DIAGNOSIS — E876 Hypokalemia: Secondary | ICD-10-CM | POA: Diagnosis not present

## 2022-01-28 DIAGNOSIS — R109 Unspecified abdominal pain: Secondary | ICD-10-CM | POA: Diagnosis not present

## 2022-01-28 LAB — CBC WITH DIFFERENTIAL/PLATELET
Abs Immature Granulocytes: 0.06 10*3/uL (ref 0.00–0.07)
Basophils Absolute: 0 10*3/uL (ref 0.0–0.1)
Basophils Relative: 0 %
Eosinophils Absolute: 0 10*3/uL (ref 0.0–0.5)
Eosinophils Relative: 0 %
HCT: 41.5 % (ref 36.0–46.0)
Hemoglobin: 14.5 g/dL (ref 12.0–15.0)
Immature Granulocytes: 1 %
Lymphocytes Relative: 11 %
Lymphs Abs: 1.4 10*3/uL (ref 0.7–4.0)
MCH: 31.3 pg (ref 26.0–34.0)
MCHC: 34.9 g/dL (ref 30.0–36.0)
MCV: 89.6 fL (ref 80.0–100.0)
Monocytes Absolute: 1.4 10*3/uL — ABNORMAL HIGH (ref 0.1–1.0)
Monocytes Relative: 11 %
Neutro Abs: 10.1 10*3/uL — ABNORMAL HIGH (ref 1.7–7.7)
Neutrophils Relative %: 77 %
Platelets: 175 10*3/uL (ref 150–400)
RBC: 4.63 MIL/uL (ref 3.87–5.11)
RDW: 12.6 % (ref 11.5–15.5)
WBC: 13.1 10*3/uL — ABNORMAL HIGH (ref 4.0–10.5)
nRBC: 0 % (ref 0.0–0.2)

## 2022-01-28 LAB — URINALYSIS, ROUTINE W REFLEX MICROSCOPIC
Bilirubin Urine: NEGATIVE
Glucose, UA: NEGATIVE mg/dL
Ketones, ur: NEGATIVE mg/dL
Nitrite: NEGATIVE
Protein, ur: >300 mg/dL — AB
RBC / HPF: >50 RBC/hpf — ABNORMAL HIGH (ref 0–5)
Specific Gravity, Urine: 1.026 (ref 1.005–1.030)
WBC, UA: >50 WBC/hpf — ABNORMAL HIGH (ref 0–5)
pH: 6 (ref 5.0–8.0)

## 2022-01-28 LAB — COMPREHENSIVE METABOLIC PANEL
ALT: 27 U/L (ref 0–44)
AST: 45 U/L — ABNORMAL HIGH (ref 15–41)
Albumin: 3.7 g/dL (ref 3.5–5.0)
Alkaline Phosphatase: 49 U/L (ref 38–126)
Anion gap: 9 (ref 5–15)
BUN: 23 mg/dL (ref 8–23)
CO2: 28 mmol/L (ref 22–32)
Calcium: 9.3 mg/dL (ref 8.9–10.3)
Chloride: 96 mmol/L — ABNORMAL LOW (ref 98–111)
Creatinine, Ser: 0.93 mg/dL (ref 0.44–1.00)
GFR, Estimated: >60 mL/min (ref >60)
Glucose, Bld: 122 mg/dL — ABNORMAL HIGH (ref 70–99)
Potassium: 3.6 mmol/L (ref 3.5–5.1)
Sodium: 133 mmol/L — ABNORMAL LOW (ref 135–145)
Total Bilirubin: 1.1 mg/dL (ref 0.3–1.2)
Total Protein: 6.7 g/dL (ref 6.5–8.1)

## 2022-01-28 LAB — GLUCOSE, CAPILLARY: Glucose-Capillary: 183 mg/dL — ABNORMAL HIGH (ref 70–99)

## 2022-01-28 MED ORDER — SODIUM CHLORIDE 0.9 % IV SOLN
2.0000 g | Freq: Once | INTRAVENOUS | Status: AC
  Administered 2022-01-28: 2 g via INTRAVENOUS
  Filled 2022-01-28: qty 20

## 2022-01-28 MED ORDER — LOSARTAN POTASSIUM-HCTZ 50-12.5 MG PO TABS: 1.0000 | ORAL_TABLET | Freq: Every day | ORAL | Status: DC

## 2022-01-28 MED ORDER — METFORMIN HCL ER 500 MG PO TB24
500.0000 mg | ORAL_TABLET | Freq: Every day | ORAL | Status: DC
  Administered 2022-01-29 – 2022-01-30 (×3): 500 mg via ORAL
  Filled 2022-01-28 (×3): qty 1

## 2022-01-28 MED ORDER — LATANOPROST 0.005 % OP SOLN
1.0000 [drp] | Freq: Every day | OPHTHALMIC | Status: DC
  Administered 2022-01-28: 1 [drp] via OPHTHALMIC
  Filled 2022-01-28: qty 2.5

## 2022-01-28 MED ORDER — PRAVASTATIN SODIUM 20 MG PO TABS
20.0000 mg | ORAL_TABLET | Freq: Every day | ORAL | Status: DC
  Administered 2022-01-29 – 2022-01-31 (×3): 20 mg via ORAL
  Filled 2022-01-28 (×3): qty 1

## 2022-01-28 MED ORDER — METFORMIN HCL ER 500 MG PO TB24: 500.0000 mg | ORAL_TABLET | Freq: Every day | ORAL | Status: DC

## 2022-01-28 MED ORDER — DORZOLAMIDE HCL-TIMOLOL MAL 2-0.5 % OP SOLN
1.0000 [drp] | Freq: Two times a day (BID) | OPHTHALMIC | Status: DC
  Administered 2022-01-28: 1 [drp] via OPHTHALMIC
  Filled 2022-01-28: qty 10

## 2022-01-28 MED ORDER — LEVOTHYROXINE SODIUM 100 MCG PO TABS
100.0000 ug | ORAL_TABLET | Freq: Every day | ORAL | Status: DC
  Administered 2022-01-29 – 2022-01-31 (×3): 100 ug via ORAL
  Filled 2022-01-28 (×3): qty 1

## 2022-01-28 MED ORDER — HYDROCHLOROTHIAZIDE 12.5 MG PO TABS
12.5000 mg | ORAL_TABLET | Freq: Every day | ORAL | Status: DC
  Administered 2022-01-29 – 2022-01-31 (×3): 12.5 mg via ORAL
  Filled 2022-01-28 (×3): qty 1

## 2022-01-28 MED ORDER — LOSARTAN POTASSIUM 50 MG PO TABS
50.0000 mg | ORAL_TABLET | Freq: Every day | ORAL | Status: DC
  Administered 2022-01-29 – 2022-01-31 (×3): 50 mg via ORAL
  Filled 2022-01-28 (×3): qty 1

## 2022-01-28 MED ORDER — SODIUM CHLORIDE 0.9 % IV SOLN: 1.0000 g | Freq: Once | INTRAVENOUS | Status: DC

## 2022-01-28 MED ORDER — SODIUM CHLORIDE 0.9 % IV BOLUS
30.0000 mL/kg | Freq: Once | INTRAVENOUS | Status: AC
Start: 2022-01-28 — End: 2022-01-28
  Administered 2022-01-28: 1000 mL via INTRAVENOUS

## 2022-01-28 MED ORDER — ENSURE ENLIVE PO LIQD
237.0000 mL | Freq: Two times a day (BID) | ORAL | Status: DC
  Administered 2022-01-29 – 2022-01-31 (×4): 237 mL via ORAL

## 2022-01-28 MED ORDER — ACETAMINOPHEN 325 MG PO TABS
650.0000 mg | ORAL_TABLET | Freq: Four times a day (QID) | ORAL | Status: DC | PRN
  Administered 2022-01-29 (×2): 650 mg via ORAL
  Filled 2022-01-28 (×3): qty 2

## 2022-01-28 MED ORDER — ENOXAPARIN SODIUM 40 MG/0.4ML IJ SOSY
40.0000 mg | PREFILLED_SYRINGE | INTRAMUSCULAR | Status: DC
Start: 2022-01-28 — End: 2022-01-31
  Administered 2022-01-28 – 2022-01-30 (×3): 40 mg via SUBCUTANEOUS
  Filled 2022-01-28 (×3): qty 0.4

## 2022-01-28 MED ORDER — SODIUM CHLORIDE 0.9 % IV SOLN
1.0000 g | INTRAVENOUS | Status: DC
  Administered 2022-01-29: 1 g via INTRAVENOUS
  Filled 2022-01-28 (×2): qty 10

## 2022-01-28 MED ORDER — SODIUM CHLORIDE 0.9% FLUSH
3.0000 mL | Freq: Two times a day (BID) | INTRAVENOUS | Status: DC
  Administered 2022-01-28 – 2022-01-31 (×6): 3 mL via INTRAVENOUS

## 2022-01-28 MED ORDER — ACETAMINOPHEN 650 MG RE SUPP: 650.0000 mg | Freq: Four times a day (QID) | RECTAL | Status: DC | PRN

## 2022-01-28 MED ORDER — METOPROLOL SUCCINATE ER 50 MG PO TB24
50.0000 mg | ORAL_TABLET | Freq: Every day | ORAL | Status: DC
  Administered 2022-01-29 – 2022-01-31 (×3): 50 mg via ORAL
  Filled 2022-01-28 (×3): qty 1

## 2022-01-28 MED ORDER — POLYETHYLENE GLYCOL 3350 17 G PO PACK: 17.0000 g | PACK | Freq: Every day | ORAL | Status: DC | PRN

## 2022-01-28 NOTE — ED Provider Notes (Signed)
[facility] DEPT Provider Note  CSN: [id number] Arrival date & time: 01/28/22 1441  Chief Complaint(s) Fever  HPI Melissa Davenport is a 81 y.o. female with history of diabetes, hyperlipidemia, hypertension, nephrolithiasis presenting to the emergency department with generalized weakness.  Patient reports generalized weakness for the past few days as well as fevers at home to 102, improved with Tylenol.  She also has chills and shaking.  She went to an urgent care yesterday, was diagnosed with urinary infection and started on cefdinir but symptoms have persisted and worsened.  Denies ever having pain with urination, foul-smelling urination, urine frequency, but reports she has had urine infections before without any of the symptoms.  Denies cough, headache, rashes, abdominal pain, flank pain.  Reports prior history of kidney stone without abdominal pain or flank pain.   Past Medical History Past Medical History:  Diagnosis Date   Allergic rhinitis    Allergy    allergic rhinitis   Basal cell carcinoma    Cancer ([place])    Cataract    Diabetes mellitus without complication ([place])    Glaucoma    Graves disease    Hyperlipidemia    Hypertension    Kidney stones    LVH (left ventricular hypertrophy) 2017   Mild to moderate   Meniere's disease    Migraines    Mixed dyslipidemia    Osteopenia    Thyroid disease    Vitamin D deficiency    Patient Active Problem List   Diagnosis Date Noted   Pyelonephritis 01/28/2022   Allergic rhinitis 09/22/2021   Body mass index (BMI) 34.0-34.9, adult 09/22/2021   Cardiomegaly 09/22/2021   Chronic sinusitis 09/22/2021   Decreased estrogen level 09/22/2021   Microalbuminuria 09/22/2021   Mixed hyperlipidemia 09/22/2021   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   Senile osteopenia 09/22/2021   Type 2 diabetes mellitus with other specified complication ([place]) 55/73/2202   Vitamin D deficiency 09/22/2021   Abnormal EKG     Precordial pain    Brow ptosis 12/24/2013   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 Authorizing Provider  aspirin 81 MG tablet Take 81 mg by mouth daily.    [provider]  AZELASTINE-FLUTICASONE-NACL NA Place into the nose.    [provider]  bimatoprost (LUMIGAN) 0.01 % SOLN Place 1 drop into both eyes at bedtime.  03/15/12   [provider]  cetirizine (ZYRTEC) 10 MG tablet Take 1 tablet by mouth daily as needed for allergies.     [provider]  Cholecalciferol 25 MCG (1000 UT) tablet Take 1 tablet by mouth daily.    [provider]  COVID-19 mRNA Vac-TriS, Pfizer, SUSP injection Inject into the muscle. 09/29/20   Carlyle Basques, MD  dorzolamide-timolol (COSOPT) 22.3-6.8 MG/ML ophthalmic solution Place 1 drop into both eyes 2 (two) times daily.    [provider]  fluticasone (FLONASE) 50 MCG/ACT nasal spray Place 1 spray into both  nostrils daily as needed for allergies. Use only needed 06/07/10   [provider]  levothyroxine (SYNTHROID, LEVOTHROID) 100 MCG tablet Take 100 mcg by mouth daily before breakfast.    [provider]  losartan-hydrochlorothiazide (HYZAAR) 50-12.5 MG per tablet Take 1 tablet by mouth daily. 08/23/10   [provider]  metFORMIN (GLUCOPHAGE-XR) 500 MG 24 hr tablet Take 500 mg by mouth at bedtime. With  dinner    [provider]  metoprolol succinate (TOPROL-XL) 50 MG 24 hr tablet Take 50 mg by mouth daily. Take with or immediately following a meal.    [provider]  Multiple Vitamin (MULTI-VITAMINS) TABS Take 1 tablet by mouth daily.    [provider]  niacin 500 MG tablet Take 1 tablet by mouth daily.    [provider]  Omega-3 1000 MG CAPS Take 3 tablets by mouth daily.    [provider]  pravastatin (PRAVACHOL) 20 MG tablet Take 20 mg by mouth daily.    [provider]  valsartan (DIOVAN) 160 MG tablet Take 1 tablet (160 mg total) by mouth daily. 09/03/20 02/16/21  Eulas Post, MD                                                                                                                                    Past Surgical History Past Surgical History:  Procedure Laterality Date   ABDOMINAL HYSTERECTOMY     CARDIAC CATHETERIZATION N/A 01/31/2016   Procedure: Left Heart Cath and Coronary Angiography;  Surgeon: Burnell Blanks, MD;  Location: [facility];  Service: Cardiovascular;  Laterality: N/A;   CATARACT EXTRACTION Bilateral    COLONOSCOPY  2008 & 2014   EYE SURGERY     FLEXIBLE SIGMOIDOSCOPY     left ankle surgery     left foot surgery     SPINE SURGERY     Family History Family History  Problem Relation Age of Onset   Heart disease Mother 51   Heart disease Father 70   Emphysema Father    Diabetes Sister    CVA Sister    Hypothyroidism Daughter     Social History Social History   Tobacco Use   Smoking status: Never   Smokeless tobacco: Never  Vaping Use   Vaping Use: Never used  Substance Use Topics   Alcohol use: No   Drug use: No   Allergies Brimonidine, Flagyl [metronidazole], Ketek [telithromycin], and Levaquin [levofloxacin]  Review of Systems Review of Systems  All other systems reviewed and are negative.   Physical Exam Vital Signs  I have reviewed the triage vital  signs BP (!) 128/47   Pulse 82   Temp 98.7 F (37.1 C) (Oral)   Resp (!) 21   Ht '5\' 3"'$  (1.6 m)   Wt 89.8 kg   SpO2 92%   BMI 35.07 kg/m  Physical Exam Vitals and nursing note  reviewed.  Constitutional:      General: She is not in acute distress.    Appearance: She is well-developed.  HENT:     Head: Normocephalic and atraumatic.     Mouth/Throat:     Mouth: Mucous membranes are moist.  Eyes:     Pupils: Pupils are equal, round, and reactive to light.  Cardiovascular:     Rate and Rhythm: Normal rate and regular rhythm.     Heart sounds: No murmur heard. Pulmonary:     Effort: Pulmonary effort is normal. No respiratory distress.     Breath sounds: Normal breath sounds.  Abdominal:     General: Abdomen is flat.     Palpations: Abdomen is soft.     Tenderness: There is no abdominal tenderness.  Musculoskeletal:        General: No tenderness.     Right lower leg: No edema.     Left lower leg: No edema.  Skin:    General: Skin is warm and dry.  Neurological:     General: No focal deficit present.     Mental Status: She is alert. Mental status is at baseline.  Psychiatric:        Mood and Affect: Mood normal.        Behavior: Behavior normal.     ED Results and Treatments Labs (all labs ordered are listed, but only abnormal results are displayed) Labs Reviewed  URINALYSIS, ROUTINE W REFLEX MICROSCOPIC - Abnormal; Notable for the following components:      Result Value   APPearance CLOUDY (*)    Hgb urine dipstick LARGE (*)    Protein, ur >300 (*)    Leukocytes,Ua LARGE (*)    RBC / HPF >50 (*)    WBC, UA >50 (*)    Bacteria, UA MANY (*)    All other components within normal limits  CBC WITH DIFFERENTIAL/PLATELET - Abnormal; Notable for the following components:   WBC 13.1 (*)    Neutro Abs 10.1 (*)    Monocytes Absolute 1.4 (*)    All other components within normal limits  COMPREHENSIVE METABOLIC PANEL - Abnormal; Notable for the following components:    Sodium 133 (*)    Chloride 96 (*)    Glucose, Bld 122 (*)    AST 45 (*)    All other components within normal limits  URINE CULTURE  CULTURE, BLOOD (ROUTINE X 2)  CULTURE, BLOOD (ROUTINE X 2)                                                                                                                          Radiology CT Abdomen Pelvis Wo Contrast  Result Date: 01/28/2022 CLINICAL DATA:  Abdominal pain, chills, and fever for 2 weeks. Recent urinary tract infection. EXAM: CT ABDOMEN AND PELVIS WITHOUT CONTRAST TECHNIQUE: Multidetector CT imaging of the abdomen and pelvis was performed following the standard protocol without IV contrast. RADIATION DOSE REDUCTION: This exam was performed  according to the departmental dose-optimization program which includes automated exposure control, adjustment of the mA and/or kV according to patient size and/or use of iterative reconstruction technique. COMPARISON:  None Available. FINDINGS: Lower chest: No acute findings. Hepatobiliary: No mass visualized on this unenhanced exam. Gallbladder is unremarkable. No evidence of biliary ductal dilatation. Pancreas: No mass or inflammatory process visualized on this unenhanced exam. Spleen:  Within normal limits in size. Adrenals/Urinary tract: Several right renal calculi are seen. Largest calculus is seen in the right renal pelvis measures 1.8 cm. No ureteral calculi identified. Mild right renal pelvicaliectasis and perinephric stranding may be due to obstructive uropathy or pyelonephritis. Unremarkable unopacified urinary bladder. Stomach/Bowel: No evidence of obstruction, inflammatory process, or abnormal fluid collections. Normal appendix visualized. Vascular/Lymphatic: No pathologically enlarged lymph nodes identified. No evidence of abdominal aortic aneurysm. Aortic atherosclerotic calcification incidentally noted. Reproductive: Prior hysterectomy noted. Adnexal regions are unremarkable in appearance. Other:  None.  Musculoskeletal:  No suspicious bone lesions identified. IMPRESSION: Right renal calculi, largest in renal pelvis measuring 1.8 cm. Mild right renal pelvicaliectasis and perinephric stranding, which may be due to obstructive uropathy or pyelonephritis. Electronically Signed   By: Marlaine Hind M.D.   On: 01/28/2022 17:34    Pertinent labs & imaging results that were available during my care of the patient were reviewed by me and considered in my medical decision making (see MDM for details).  Medications Ordered in ED Medications  sodium chloride 0.9 % bolus 1,572 mL (0 mLs Intravenous Stopped 01/28/22 1804)  cefTRIAXone (ROCEPHIN) 2 g in sodium chloride 0.9 % 100 mL IVPB (0 g Intravenous Stopped 01/28/22 1700)                                                                                                                                     Procedures Procedures  (including critical care time)  Medical Decision Making / ED Course   MDM:  81 year old female presenting to the emergency department with generalized weakness and fever.  Patient well-appearing, no CVA tenderness or abdominal tenderness on exam.  Urine test notable for many WBC and bacteria concerning for urinary infection.  Patient has taken cefdinir as outpatient without improvement in symptoms.  Will give ceftriaxone.  Given ongoing fevers, age, patient would likely need admission given how weak she is.  Will obtain CT scan given history of kidney stone.  No sign of other infection such as meningitis or CNS infection, skin or soft tissue infection, pneumonia, intra-abdominal infection given reassuring exam and history.  EKG notable for mild diffuse T wave inversions, suspect demand related in the setting of infection, no chest pain, shortness of breath, diaphoresis or lightheadedness to raise concern for ACS.   Clinical Course as of 01/28/22 1849  Sat Jan 28, 2022  1801 Paged hospitalist for admission.  [WS]  [phone] Signed out  to hospitalist, who will admit the patient.  [WS]  Clinical Course User Index [WS] Cristie Hem, MD     Additional history obtained: -Additional history obtained from daughter -External records from outside source obtained and reviewed including: Chart review including previous notes, labs, imaging, consultation notes   Lab Tests: -I ordered, reviewed, and interpreted labs.   The pertinent results include:   Labs Reviewed  URINALYSIS, ROUTINE W REFLEX MICROSCOPIC - Abnormal; Notable for the following components:      Result Value   APPearance CLOUDY (*)    Hgb urine dipstick LARGE (*)    Protein, ur >300 (*)    Leukocytes,Ua LARGE (*)    RBC / HPF >50 (*)    WBC, UA >50 (*)    Bacteria, UA MANY (*)    All other components within normal limits  CBC WITH DIFFERENTIAL/PLATELET - Abnormal; Notable for the following components:   WBC 13.1 (*)    Neutro Abs 10.1 (*)    Monocytes Absolute 1.4 (*)    All other components within normal limits  COMPREHENSIVE METABOLIC PANEL - Abnormal; Notable for the following components:   Sodium 133 (*)    Chloride 96 (*)    Glucose, Bld 122 (*)    AST 45 (*)    All other components within normal limits  URINE CULTURE  CULTURE, BLOOD (ROUTINE X 2)  CULTURE, BLOOD (ROUTINE X 2)      EKG   EKG Interpretation  Date/Time:  Saturday January 28 2022 16:26:41 EDT Ventricular Rate:  88 PR Interval:  201 QRS Duration: 93 QT Interval:  342 QTC Calculation: 414 R Axis:   -5 Text Interpretation: Sinus rhythm Abnormal R-wave progression, early transition Abnormal T, consider ischemia, diffuse leads Confirmed by Garnette Gunner [phone]) on 01/28/2022 4:29:06 PM         Imaging Studies ordered: I ordered imaging studies including CT A/P I independently visualized and interpreted imaging. I agree with the radiologist interpretation   Medicines ordered and prescription drug management: Meds ordered this encounter  Medications    DISCONTD: ertapenem (INVANZ) 1,000 mg in sodium chloride 0.9 % 100 mL IVPB    Order Specific Question:   My specialty / Organism    Answer:   ESBL organism    Order Specific Question:   Antibiotic Indication:    Answer:   ESBL Infection   sodium chloride 0.9 % bolus 1,572 mL   DISCONTD: meropenem (MERREM) 1 g in sodium chloride 0.9 % 100 mL IVPB    Order Specific Question:   Antibiotic Indication:    Answer:   ESBL Infection   cefTRIAXone (ROCEPHIN) 2 g in sodium chloride 0.9 % 100 mL IVPB    Order Specific Question:   Antibiotic Indication:    Answer:   UTI    -I have reviewed the patients home medicines and have made adjustments as needed    Consultations Obtained: I requested consultation with the hospitalist,  and discussed lab and imaging findings as well as pertinent plan - they recommend: admission   Cardiac Monitoring: The patient was maintained on a cardiac monitor.  I personally viewed and interpreted the cardiac monitored which showed an underlying rhythm of: NSR   Reevaluation: After the interventions noted above, I reevaluated the patient and found that they have :improved  Co morbidities that complicate the patient evaluation  Past Medical History:  Diagnosis Date   Allergic rhinitis    Allergy    allergic rhinitis   Basal cell carcinoma    Cancer ([place])  Cataract    Diabetes mellitus without complication ([place])    Glaucoma    Graves disease    Hyperlipidemia    Hypertension    Kidney stones    LVH (left ventricular hypertrophy) 2017   Mild to moderate   Meniere's disease    Migraines    Mixed dyslipidemia    Osteopenia    Thyroid disease    Vitamin D deficiency       Dispostion: Admit     Final Clinical Impression(s) / ED Diagnoses Final diagnoses:  Pyelonephritis  Sepsis, due to unspecified organism, unspecified whether acute organ dysfunction present [facility])     This chart was dictated using voice recognition software.  Despite best  efforts to proofread,  errors can occur which can change the documentation meaning.    Cristie Hem, MD 01/28/22 [phone]

## 2022-01-28 NOTE — ED Triage Notes (Signed)
Patient here POV from Home.  Endorses Approximately 2 Weeks ago she began to have [employer] that has since progressed into Chills, Fever.   Visited UC Yesterday and diagnosed with UTI and placed on Antibiotics. Respiratory Panel Negative at that Time.   Endorses Continued Fever and Symptoms.   NAD Noted during Triage. A&Ox4. GCS 15. BIB Wheelchair.

## 2022-01-28 NOTE — H&P (Addendum)
History and Physical   Melissa Davenport:[id number] DOB: 11-11-1940 DOA: 01/28/2022  PCP: Eilene Ghazi, NP   Patient coming from: Home  Chief Complaint: Fever, malaise  HPI: Melissa Davenport is a 81 y.o. 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  Patient reports 2 weeks of generalized malaise.  Developed fevers and chills starting 5-7 days ago  Has noticed fevers as high as 101 at home.  Was evaluated in urgent care yesterday and diagnosed with a UTI and prescribed cefdinir.  She is taken 3 doses of this but has continued to have symptoms and spiked fever so presented to the ED for further evaluation.  She denies any urinary symptoms however she has had previous UTIs without symptoms in the past.  She denies chest pain, shortness of breath, abdominal pain, constipation, diarrhea, nausea, vomiting.  ED Course: Vital signs in the ED significant for blood pressure in the 314H to 702O systolic, respiratory rate in the teens to 20s.  Lab work-up included CMP with sodium 133, chloride 96, glucose 122, AST 45.  CBC with leukocytosis 13.1.  Urinalysis consistent with UTI with hemoglobin, protein, leukocytes, white blood cells, bacteria.  Urine culture and blood culture pending.  CT of the abdomen pelvis showed right-sided renal calculi of 1.8 cm as well as mild right-sided changes consistent with pyelonephritis versus obstructive uropathy.  Patient received ceftriaxone and 1/2 L of fluid in the ED.  Admission requested for complicated UTI/pyelonephritis.  Review of Systems: As per HPI otherwise all other systems reviewed and are negative.  Past Medical History:  Diagnosis Date   Allergic rhinitis    Allergy    allergic rhinitis   Basal cell carcinoma    Cancer ([place])    Cataract    Diabetes mellitus without complication  ([place])    Glaucoma    Graves disease    Hyperlipidemia    Hypertension    Kidney stones    LVH (left ventricular hypertrophy) 2017   Mild to moderate   Meniere's disease    Migraines    Mixed dyslipidemia    Osteopenia    Thyroid disease    Vitamin D deficiency     Past Surgical History:  Procedure Laterality Date   ABDOMINAL HYSTERECTOMY     CARDIAC CATHETERIZATION N/A 01/31/2016   Procedure: Left Heart Cath and Coronary Angiography;  Surgeon: Burnell Blanks, MD;  Location: [facility];  Service: Cardiovascular;  Laterality: N/A;   CATARACT EXTRACTION Bilateral    COLONOSCOPY  2008 & 2014   EYE SURGERY     FLEXIBLE SIGMOIDOSCOPY     left ankle surgery     left foot surgery     SPINE SURGERY      Social History  reports that she has never smoked. She has never used smokeless tobacco. She reports that she does not drink alcohol and does not use drugs.  Allergies  Allergen Reactions   Brimonidine Swelling    Redness   Flagyl [Metronidazole] Other (See Comments)    dizziness   Ketek [Telithromycin] Other (See Comments)    anixety or GI upset   Levaquin [Levofloxacin] Nausea Only    Family History  Problem Relation Age of Onset   Heart disease Mother 64   Heart disease Father 41   Emphysema Father    Diabetes Sister    CVA Sister  Hypothyroidism Daughter   Reviewed on admission  Prior to Admission medications   Medication Sig Start Date End Date Taking? Authorizing Provider  aspirin 81 MG tablet Take 81 mg by mouth daily.    [provider]  AZELASTINE-FLUTICASONE-NACL NA Place into the nose.    [provider]  bimatoprost (LUMIGAN) 0.01 % SOLN Place 1 drop into both eyes at bedtime.  03/15/12   [provider]  cetirizine (ZYRTEC) 10 MG tablet Take 1 tablet by mouth daily as needed for allergies.     [provider]  Cholecalciferol 25 MCG (1000 UT) tablet Take 1 tablet by mouth daily.    [provider]  COVID-19 mRNA Vac-TriS, Pfizer, SUSP injection Inject into the muscle. 09/29/20   Carlyle Basques, MD  dorzolamide-timolol (COSOPT) 22.3-6.8 MG/ML ophthalmic solution Place 1 drop into both eyes 2 (two) times daily.    [provider]  fluticasone (FLONASE) 50 MCG/ACT nasal spray Place 1 spray into both nostrils daily as needed for allergies. Use only needed 06/07/10   [provider]  levothyroxine (SYNTHROID, LEVOTHROID) 100 MCG tablet Take 100 mcg by mouth daily before breakfast.    [provider]  losartan-hydrochlorothiazide (HYZAAR) 50-12.5 MG per tablet Take 1 tablet by mouth daily. 08/23/10   [provider]  metFORMIN (GLUCOPHAGE-XR) 500 MG 24 hr tablet Take 500 mg by mouth at bedtime. With dinner    [provider]  metoprolol succinate (TOPROL-XL) 50 MG 24 hr tablet Take 50 mg by mouth daily. Take with or immediately following a meal.    [provider]  Multiple Vitamin (MULTI-VITAMINS) TABS Take 1 tablet by mouth daily.    [provider]  niacin 500 MG tablet Take 1 tablet by mouth daily.    [provider]  Omega-3 1000 MG CAPS Take 3 tablets by mouth daily.    [provider]  pravastatin (PRAVACHOL) 20 MG tablet Take 20 mg by mouth daily.    [provider]  valsartan (DIOVAN) 160 MG tablet Take 1 tablet (160 mg total) by mouth daily. 09/03/20 02/16/21  Eulas Post, MD    Physical Exam: Vitals:   01/28/22 1840 01/28/22 1930 01/28/22 1945 01/28/22 2112  BP:  (!) 119/39 (!) 124/42 (!) 136/56  Pulse:  80 78 81  Resp:  17 (!) 21 18  Temp: 98.7 F (37.1 C)  99.1 F (37.3 C) 99.5 F (37.5 C)  TempSrc: Oral  Oral Oral  SpO2:  94% 94% 97%  Weight:      Height:        Physical Exam Constitutional:      General: She is not in acute distress.    Appearance: Normal appearance. She is obese. She is ill-appearing.  HENT:     Head: Normocephalic and atraumatic.      Mouth/Throat:     Mouth: Mucous membranes are moist.     Pharynx: Oropharynx is clear.  Eyes:     Extraocular Movements: Extraocular movements intact.     Pupils: Pupils are equal, round, and reactive to light.  Cardiovascular:     Rate and Rhythm: Normal rate and regular rhythm.     Pulses: Normal pulses.     Heart sounds: Normal heart sounds.  Pulmonary:     Effort: Pulmonary effort is normal. No respiratory distress.     Breath sounds: Normal breath sounds.  Abdominal:     General: Bowel sounds are normal. There is no distension.  Palpations: Abdomen is soft.     Tenderness: There is no abdominal tenderness.  Musculoskeletal:        General: No swelling or deformity.  Skin:    General: Skin is warm and dry.  Neurological:     General: No focal deficit present.     Mental Status: Mental status is at baseline.    Labs on Admission: I have personally reviewed following labs and imaging studies  CBC: Recent Labs  Lab 01/28/22 1610  WBC 13.1*  NEUTROABS 10.1*  HGB 14.5  HCT 41.5  MCV 89.6  PLT 564    Basic Metabolic Panel: Recent Labs  Lab 01/28/22 1610  NA 133*  K 3.6  CL 96*  CO2 28  GLUCOSE 122*  BUN 23  CREATININE 0.93  CALCIUM 9.3    GFR: Estimated Creatinine Clearance: 52.2 mL/min (by C-G formula based on SCr of 0.93 mg/dL).  Liver Function Tests: Recent Labs  Lab 01/28/22 1610  AST 45*  ALT 27  ALKPHOS 49  BILITOT 1.1  PROT 6.7  ALBUMIN 3.7    Urine analysis:    Component Value Date/Time   COLORURINE YELLOW 01/28/2022 1455   APPEARANCEUR CLOUDY (A) 01/28/2022 1455   LABSPEC 1.026 01/28/2022 1455   PHURINE 6.0 01/28/2022 1455   GLUCOSEU NEGATIVE 01/28/2022 1455   HGBUR LARGE (A) 01/28/2022 1455   BILIRUBINUR NEGATIVE 01/28/2022 1455   [place] 01/28/2022 1455   PROTEINUR >300 (A) 01/28/2022 1455   NITRITE NEGATIVE 01/28/2022 1455   LEUKOCYTESUR LARGE (A) 01/28/2022 1455    Radiological Exams on Admission: CT  Abdomen Pelvis Wo Contrast  Result Date: 01/28/2022 CLINICAL DATA:  Abdominal pain, chills, and fever for 2 weeks. Recent urinary tract infection. EXAM: CT ABDOMEN AND PELVIS WITHOUT CONTRAST TECHNIQUE: Multidetector CT imaging of the abdomen and pelvis was performed following the standard protocol without IV contrast. RADIATION DOSE REDUCTION: This exam was performed according to the departmental dose-optimization program which includes automated exposure control, adjustment of the mA and/or kV according to patient size and/or use of iterative reconstruction technique. COMPARISON:  None Available. FINDINGS: Lower chest: No acute findings. Hepatobiliary: No mass visualized on this unenhanced exam. Gallbladder is unremarkable. No evidence of biliary ductal dilatation. Pancreas: No mass or inflammatory process visualized on this unenhanced exam. Spleen:  Within normal limits in size. Adrenals/Urinary tract: Several right renal calculi are seen. Largest calculus is seen in the right renal pelvis measures 1.8 cm. No ureteral calculi identified. Mild right renal pelvicaliectasis and perinephric stranding may be due to obstructive uropathy or pyelonephritis. Unremarkable unopacified urinary bladder. Stomach/Bowel: No evidence of obstruction, inflammatory process, or abnormal fluid collections. Normal appendix visualized. Vascular/Lymphatic: No pathologically enlarged lymph nodes identified. No evidence of abdominal aortic aneurysm. Aortic atherosclerotic calcification incidentally noted. Reproductive: Prior hysterectomy noted. Adnexal regions are unremarkable in appearance. Other:  None. Musculoskeletal:  No suspicious bone lesions identified. IMPRESSION: Right renal calculi, largest in renal pelvis measuring 1.8 cm. Mild right renal pelvicaliectasis and perinephric stranding, which may be due to obstructive uropathy or pyelonephritis. Electronically Signed   By: Marlaine Hind M.D.   On: 01/28/2022 17:34    EKG:  Independently reviewed.  Sinus rhythm at 88 bpm.  Nonspecific T wave flattening.  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 > Seen in urgent care yesterday prescribed cefdinir taken several doses but continues have significant symptoms and fevers. > In the ED found to have systemic symptoms with leukocytosis to 13.1.  CT of the abdomen pelvis showing changes consistent with pyelonephritis or possibly obstructive uropathy, does have renal stone but not currently obstructive. > Received ceftriaxone and 1.5 L of fluid in the ED. - Monitor on telemetry - Continue with ceftriaxone - Trend fever curve and WBC - Follow-up urine culture and blood cultures  Hypertension - Continue home losartan-hydrochlorothiazide commendation - Continue home metoprolol  Postablative hypothyroidism > History of Flajani disease and chart appears to be the same thing is Graves' disease - Continue home Synthroid  Hyperlipidemia - Continue home pravastatin  Diabetes - Continue home metformin  Eye disease including glaucoma - Continue home eyedrops Cosopt, and replace other with formulary latanoprost.  Obesity - Noted  DVT prophylaxis: Lovenox Code Status:   Full Family Communication:  Updated at bedside Disposition Plan:   Patient is from:  Home  Anticipated DC to:  Home  Anticipated DC date:  1 to 2 days  Anticipated DC barriers: None  Consults called:  None Admission status:  Ovation, telemetry  Severity of Illness: The appropriate patient status for this patient is OBSERVATION. Observation status is judged to be reasonable and necessary in order to provide  the required intensity of service to ensure the patient's safety. The patient's presenting symptoms, physical exam findings, and initial radiographic and laboratory data in the context of their medical condition is felt to place them at decreased risk for further clinical deterioration. Furthermore, it is anticipated that the patient will be medically stable for discharge from the hospital within 2 midnights of admission.    Marcelyn Bruins MD [facility]  How to contact the [facility] Attending or Consulting provider [place] or covering provider during after hours [place], for this patient?   Check the care team in [facility] and look for a) attending/consulting [facility] provider listed and b) the [facility] team listed Log into [email or web address] and use [place]'s universal password to access. If you do not have the password, please contact the hospital operator. Locate the [facility] provider you are looking for under [facility] and page to a number that you can be directly reached. If you still have difficulty reaching the provider, please page the [facility] (Director on Call) for the Hospitalists listed on amion for assistance.  01/28/2022, 9:17 PM

## 2022-01-29 ENCOUNTER — Encounter (HOSPITAL_COMMUNITY): Payer: Self-pay | Admitting: Internal Medicine

## 2022-01-29 DIAGNOSIS — Z6835 Body mass index (BMI) 35.0-35.9, adult: Secondary | ICD-10-CM | POA: Diagnosis not present

## 2022-01-29 DIAGNOSIS — E782 Mixed hyperlipidemia: Secondary | ICD-10-CM | POA: Diagnosis present

## 2022-01-29 DIAGNOSIS — N200 Calculus of kidney: Secondary | ICD-10-CM | POA: Diagnosis present

## 2022-01-29 DIAGNOSIS — E890 Postprocedural hypothyroidism: Secondary | ICD-10-CM | POA: Diagnosis present

## 2022-01-29 DIAGNOSIS — B999 Unspecified infectious disease: Secondary | ICD-10-CM | POA: Diagnosis not present

## 2022-01-29 DIAGNOSIS — E669 Obesity, unspecified: Secondary | ICD-10-CM | POA: Diagnosis present

## 2022-01-29 DIAGNOSIS — Z825 Family history of asthma and other chronic lower respiratory diseases: Secondary | ICD-10-CM | POA: Diagnosis not present

## 2022-01-29 DIAGNOSIS — A419 Sepsis, unspecified organism: Secondary | ICD-10-CM | POA: Diagnosis present

## 2022-01-29 DIAGNOSIS — Z85828 Personal history of other malignant neoplasm of skin: Secondary | ICD-10-CM | POA: Diagnosis not present

## 2022-01-29 DIAGNOSIS — E876 Hypokalemia: Secondary | ICD-10-CM | POA: Diagnosis present

## 2022-01-29 DIAGNOSIS — Z7984 Long term (current) use of oral hypoglycemic drugs: Secondary | ICD-10-CM | POA: Diagnosis not present

## 2022-01-29 DIAGNOSIS — Z833 Family history of diabetes mellitus: Secondary | ICD-10-CM | POA: Diagnosis not present

## 2022-01-29 DIAGNOSIS — N390 Urinary tract infection, site not specified: Secondary | ICD-10-CM | POA: Diagnosis not present

## 2022-01-29 DIAGNOSIS — Z823 Family history of stroke: Secondary | ICD-10-CM | POA: Diagnosis not present

## 2022-01-29 DIAGNOSIS — H40003 Preglaucoma, unspecified, bilateral: Secondary | ICD-10-CM | POA: Diagnosis present

## 2022-01-29 DIAGNOSIS — E559 Vitamin D deficiency, unspecified: Secondary | ICD-10-CM | POA: Diagnosis present

## 2022-01-29 DIAGNOSIS — N12 Tubulo-interstitial nephritis, not specified as acute or chronic: Secondary | ICD-10-CM | POA: Diagnosis present

## 2022-01-29 DIAGNOSIS — E1169 Type 2 diabetes mellitus with other specified complication: Secondary | ICD-10-CM | POA: Diagnosis present

## 2022-01-29 DIAGNOSIS — Z90710 Acquired absence of both cervix and uterus: Secondary | ICD-10-CM | POA: Diagnosis not present

## 2022-01-29 DIAGNOSIS — Z8249 Family history of ischemic heart disease and other diseases of the circulatory system: Secondary | ICD-10-CM | POA: Diagnosis not present

## 2022-01-29 DIAGNOSIS — I10 Essential (primary) hypertension: Secondary | ICD-10-CM | POA: Diagnosis present

## 2022-01-29 LAB — CBC
HCT: 41 % (ref 36.0–46.0)
Hemoglobin: 14 g/dL (ref 12.0–15.0)
MCH: 31.5 pg (ref 26.0–34.0)
MCHC: 34.1 g/dL (ref 30.0–36.0)
MCV: 92.1 fL (ref 80.0–100.0)
Platelets: 170 10*3/uL (ref 150–400)
RBC: 4.45 MIL/uL (ref 3.87–5.11)
RDW: 12.6 % (ref 11.5–15.5)
WBC: 10.6 10*3/uL — ABNORMAL HIGH (ref 4.0–10.5)
nRBC: 0 % (ref 0.0–0.2)

## 2022-01-29 LAB — BASIC METABOLIC PANEL
Anion gap: 9 (ref 5–15)
BUN: 18 mg/dL (ref 8–23)
CO2: 28 mmol/L (ref 22–32)
Calcium: 8.6 mg/dL — ABNORMAL LOW (ref 8.9–10.3)
Chloride: 103 mmol/L (ref 98–111)
Creatinine, Ser: 0.94 mg/dL (ref 0.44–1.00)
GFR, Estimated: >60 mL/min (ref >60)
Glucose, Bld: 104 mg/dL — ABNORMAL HIGH (ref 70–99)
Potassium: 2.8 mmol/L — ABNORMAL LOW (ref 3.5–5.1)
Sodium: 140 mmol/L (ref 135–145)

## 2022-01-29 LAB — URINE CULTURE: Culture: <10000 — AB

## 2022-01-29 LAB — MAGNESIUM: Magnesium: 2 mg/dL (ref 1.7–2.4)

## 2022-01-29 MED ORDER — BIMATOPROST 0.01 % OP SOLN
1.0000 [drp] | Freq: Every day | OPHTHALMIC | Status: DC
Start: 2022-01-29 — End: 2022-01-31
  Administered 2022-01-29 – 2022-01-30 (×2): 1 [drp] via OPHTHALMIC

## 2022-01-29 MED ORDER — POTASSIUM CHLORIDE CRYS ER 20 MEQ PO TBCR
40.0000 meq | EXTENDED_RELEASE_TABLET | ORAL | Status: AC
  Administered 2022-01-29 (×3): 40 meq via ORAL
  Filled 2022-01-29 (×3): qty 2

## 2022-01-29 MED ORDER — ORAL CARE MOUTH RINSE: 15.0000 mL | OROMUCOSAL | Status: DC | PRN

## 2022-01-29 MED ORDER — DORZOLAMIDE HCL-TIMOLOL MAL 2-0.5 % OP SOLN
1.0000 [drp] | Freq: Two times a day (BID) | OPHTHALMIC | Status: DC
  Administered 2022-01-29 – 2022-01-31 (×5): 1 [drp] via OPHTHALMIC

## 2022-01-29 NOTE — Plan of Care (Signed)

## 2022-01-29 NOTE — Plan of Care (Signed)

## 2022-01-29 NOTE — Progress Notes (Signed)
PROGRESS NOTE  Melissa Davenport  IRJ:[id number] DOB: [birth year] DOA: 01/28/2022 PCP: Eilene Ghazi, NP   Brief Narrative:  Patient is a 81 year old female with history of hypothyroidism, glaucoma, hypertension, diabetes type 2, hyperlipidemia who presented from home with complaints of malaise, fever and chills.  This has been ongoing for last 5 to 7 days.  Patient was febrile up to 101 F at home.  She was seen at urgent care and was diagnosed to have urinary tract infection and was prescribed cefdinir.  Patient continued to have symptoms, he spiked fever at home so presented to the emergency department for further evaluation. On presentation ,she was hemodynamically stable.  Lab work showed leukocytosis of 13.1.  UA was consistent with UTI with significant leukocytes, bacteria.  CT abdomen/pelvis was done which showed right-sided renal calculi ,one was 1.8 cm.  Kidney function was normal.  Patient was admitted for the management of complicated UTI.  Started on ceftriaxone.  Urine culture, blood culture sent.  Urology consulted .  Assessment & Plan:  Principal Problem:   Pyelonephritis Active Problems:   Flajani disease   BP (high blood pressure)   Glaucoma suspect of both eyes   Body mass index (BMI) 34.0-34.9, adult   Mixed hyperlipidemia   Postablative hypothyroidism   Type 2 diabetes mellitus with other specified complication (H[facility])   Sepsis/pyelonephritis: Presented with fever, chills.  Was taking oral antibiotics which did not help.  CT abdomen/pelvis showed changes consistent with pyelonephritis/possibly obstructive uropathy with renal stone.  Started on ceftriaxone.  Urine culture, blood culture sent. Continue gentle IV fluids. She spiked fever of 101 F early this morning  Urolithiasis: 2 stones were seen on the right side.  1 measuring 1.8 cm in the pelvis.  No obstructive changes with normal renal function.  Urology consulted, plan for percutaneous nephrolithotomy when she  recovers from pyelonephritis and will be followed by urology in the office.  Severe hypokalemia: Potassium 2.8.  Being supplemented. Normal magnesium level.  History of hypertension: Takes losartan, hydrochlorothiazide, metoprolol.  Currently blood pressure stable.  Continue metoprolol.  Hypothyroidism: Continue home Synthyroid  Hyperlipidemia: Continue pravastatin  Diabetes type 2: Continue to monitor blood sugars.  Takes metformin at home.  History of glaucoma: Continue home eyedrops  Obesity: BMI 35.07         DVT prophylaxis:enoxaparin (LOVENOX) injection 40 mg Start: 01/28/22 2200     Code Status: Full Code  Family Communication: Daughter at bedside  Patient status:Obs  Patient is from :Home  Anticipated discharge TK:ZSWF  Estimated DC date:2-3 days   Consultants: urology  Procedures:None  Antimicrobials:  Anti-infectives (From admission, onward)    Start     Dose/Rate Route Frequency Ordered Stop   01/29/22 1000  cefTRIAXone (ROCEPHIN) 1 g in sodium chloride 0.9 % 100 mL IVPB        1 g 200 mL/hr over 30 Minutes Intravenous Every 24 hours 01/28/22 2012     01/28/22 1615  cefTRIAXone (ROCEPHIN) 2 g in sodium chloride 0.9 % 100 mL IVPB        2 g 200 mL/hr over 30 Minutes Intravenous  Once 01/28/22 1604 01/28/22 1700   01/28/22 1600  meropenem (MERREM) 1 g in sodium chloride 0.9 % 100 mL IVPB  Status:  Discontinued        1 g 200 mL/hr over 30 Minutes Intravenous  Once 01/28/22 1549 01/28/22 1603   01/28/22 1545  ertapenem (INVANZ) 1,000 mg in sodium chloride 0.9 % 100 mL IVPB  Status:  Discontinued        1 g 200 mL/hr over 30 Minutes Intravenous  Once 01/28/22 1541 01/28/22 1549       Subjective:  Patient seen and examined at the bedside this morning.  Hemodynamically stable.  Feels weak.  Spiked fever this morning but afebrile during my evaluation.  Long discussion held with the patient and daughter at bedside.  Denies any abdominal pain, nausea  or vomiting.  Objective: Vitals:   01/28/22 2112 01/29/22 0018 01/29/22 0123 01/29/22 0526  BP: (!) 136/56  (!) 119/46 135/62  Pulse: 81  78 80  Resp: 18  (!) 22 19  Temp: 99.5 F (37.5 C) (!) 101 F (38.3 C) 99 F (37.2 C) 99.6 F (37.6 C)  TempSrc: Oral Axillary Oral Oral  SpO2: 97%  94% 95%  Weight:      Height:        Intake/Output Summary (Last 24 hours) at 01/29/2022 0737 Last data filed at 01/28/2022 2230 Gross per 24 hour  Intake 240 ml  Output --  Net 240 ml   Filed Weights   01/28/22 1452  Weight: 89.8 kg    Examination:  General exam: Overall comfortable, not in distress,obese HEENT: PERRL Respiratory system:  no wheezes or crackles  Cardiovascular system: S1 & S2 heard, RRR.  Gastrointestinal system: Abdomen is nondistended, soft and nontender. Central nervous system: Alert and oriented Extremities: No edema, no clubbing ,no cyanosis Skin: No rashes, no ulcers,no icterus     Data Reviewed: I have personally reviewed following labs and imaging studies  CBC: Recent Labs  Lab 01/28/22 1610 01/29/22 0520  WBC 13.1* 10.6*  NEUTROABS 10.1*  --   HGB 14.5 14.0  HCT 41.5 41.0  MCV 89.6 92.1  PLT 175 161   Basic Metabolic Panel: Recent Labs  Lab 01/28/22 1610 01/29/22 0520  NA 133* 140  K 3.6 2.8*  CL 96* 103  CO2 28 28  GLUCOSE 122* 104*  BUN 23 18  CREATININE 0.93 0.94  CALCIUM 9.3 8.6*     No results found for this or any previous visit (from the past 240 hour(s)).   Radiology Studies: CT Abdomen Pelvis Wo Contrast  Result Date: 01/28/2022 CLINICAL DATA:  Abdominal pain, chills, and fever for 2 weeks. Recent urinary tract infection. EXAM: CT ABDOMEN AND PELVIS WITHOUT CONTRAST TECHNIQUE: Multidetector CT imaging of the abdomen and pelvis was performed following the standard protocol without IV contrast. RADIATION DOSE REDUCTION: This exam was performed according to the departmental dose-optimization program which includes automated  exposure control, adjustment of the mA and/or kV according to patient size and/or use of iterative reconstruction technique. COMPARISON:  None Available. FINDINGS: Lower chest: No acute findings. Hepatobiliary: No mass visualized on this unenhanced exam. Gallbladder is unremarkable. No evidence of biliary ductal dilatation. Pancreas: No mass or inflammatory process visualized on this unenhanced exam. Spleen:  Within normal limits in size. Adrenals/Urinary tract: Several right renal calculi are seen. Largest calculus is seen in the right renal pelvis measures 1.8 cm. No ureteral calculi identified. Mild right renal pelvicaliectasis and perinephric stranding may be due to obstructive uropathy or pyelonephritis. Unremarkable unopacified urinary bladder. Stomach/Bowel: No evidence of obstruction, inflammatory process, or abnormal fluid collections. Normal appendix visualized. Vascular/Lymphatic: No pathologically enlarged lymph nodes identified. No evidence of abdominal aortic aneurysm. Aortic atherosclerotic calcification incidentally noted. Reproductive: Prior hysterectomy noted. Adnexal regions are unremarkable in appearance. Other:  None. Musculoskeletal:  No suspicious bone lesions identified. IMPRESSION: Right renal  calculi, largest in renal pelvis measuring 1.8 cm. Mild right renal pelvicaliectasis and perinephric stranding, which may be due to obstructive uropathy or pyelonephritis. Electronically Signed   By: Marlaine Hind M.D.   On: 01/28/2022 17:34    Scheduled Meds:  dorzolamide-timolol  1 drop Both Eyes BID   enoxaparin (LOVENOX) injection  40 mg Subcutaneous Q24H   feeding supplement  237 mL Oral BID BM   losartan  50 mg Oral Daily   And   hydrochlorothiazide  12.5 mg Oral Daily   latanoprost  1 drop Both Eyes QHS   levothyroxine  100 mcg Oral QAC breakfast   metFORMIN  500 mg Oral Q supper   metoprolol succinate  50 mg Oral Daily   potassium chloride  40 mEq Oral Q1 Hr x 3   pravastatin  20  mg Oral Daily   sodium chloride flush  3 mL Intravenous Q12H   Continuous Infusions:  cefTRIAXone (ROCEPHIN)  IV       LOS: 0 days   Shelly Coss, MD [facility] P8/27/2023, 7:37 AM

## 2022-01-29 NOTE — Consult Note (Signed)
Subjective: 1. Pyelonephritis   2. Sepsis, due to unspecified organism, unspecified whether acute organ dysfunction present [facility])      Consult requested by Dr. Shelly Coss.  I was asked to see Melissa Davenport for a febrile UTI with a large right renal stone on CT.   She has a history of stones with prior stenting by Dr. Diona Fanti remotely.   She had the onset 2 weeks ago of a head and malaise and then developed fever to 103.  She had no associate flank pain, hematuria or voiding complaints but per her daughter she has had recurrent UTI's without symptoms.   Her admission UA had many bacteria, >50 RBC's and >50 WBC.  The urine culture is pending.  Her WBC count was 13.1 on admission and her Cr was 0.93.   She has had some mild nausea and a cough but no other associated signs or symptoms.    ROS:  Review of Systems  All other systems reviewed and are negative.   Allergies  Allergen Reactions   Brimonidine Swelling    Redness   Flagyl [Metronidazole] Other (See Comments)    dizziness   Ketek [Telithromycin] Other (See Comments)    anixety or GI upset   Levaquin [Levofloxacin] Nausea Only    Past Medical History:  Diagnosis Date   Allergic rhinitis    Allergy    allergic rhinitis   Basal cell carcinoma    Cancer ([place])    Cataract    Diabetes mellitus without complication ([place])    Glaucoma    Graves disease    Hyperlipidemia    Hypertension    Kidney stones    LVH (left ventricular hypertrophy) 2017   Mild to moderate   Meniere's disease    Migraines    Mixed dyslipidemia    Osteopenia    Thyroid disease    Vitamin D deficiency     Past Surgical History:  Procedure Laterality Date   ABDOMINAL HYSTERECTOMY     CARDIAC CATHETERIZATION N/A 01/31/2016   Procedure: Left Heart Cath and Coronary Angiography;  Surgeon: Burnell Blanks, MD;  Location: [facility];  Service: Cardiovascular;  Laterality: N/A;   CATARACT EXTRACTION Bilateral    COLONOSCOPY  2008 &  2014   EYE SURGERY     FLEXIBLE SIGMOIDOSCOPY     left ankle surgery     left foot surgery     SPINE SURGERY      Social History   Socioeconomic History   Marital status: Divorced    Spouse name: Not on file   Number of children: 2   Years of education: Not on file   Highest education level: Not on file  Occupational History   Not on file  Tobacco Use   Smoking status: Never   Smokeless tobacco: Never  Vaping Use   Vaping Use: Never used  Substance and Sexual Activity   Alcohol use: No   Drug use: No   Sexual activity: Not on file  Other Topics Concern   Not on file  Social History Narrative   Not on file   Social Determinants of Health   Financial Resource Strain: Not on file  Food Insecurity: Not on file  Transportation Needs: Not on file  Physical Activity: Not on file  Stress: Not on file  Social Connections: Not on file  Intimate Partner Violence: Not on file    Family History  Problem Relation Age of Onset   Heart disease Mother  56   Heart disease Father 32   Emphysema Father    Diabetes Sister    CVA Sister    Hypothyroidism Daughter     Anti-infectives: Anti-infectives (From admission, onward)    Start     Dose/Rate Route Frequency Ordered Stop   01/29/22 1000  cefTRIAXone (ROCEPHIN) 1 g in sodium chloride 0.9 % 100 mL IVPB        1 g 200 mL/hr over 30 Minutes Intravenous Every 24 hours 01/28/22 2012     01/28/22 1615  cefTRIAXone (ROCEPHIN) 2 g in sodium chloride 0.9 % 100 mL IVPB        2 g 200 mL/hr over 30 Minutes Intravenous  Once 01/28/22 1604 01/28/22 1700   01/28/22 1600  meropenem (MERREM) 1 g in sodium chloride 0.9 % 100 mL IVPB  Status:  Discontinued        1 g 200 mL/hr over 30 Minutes Intravenous  Once 01/28/22 1549 01/28/22 1603   01/28/22 1545  ertapenem (INVANZ) 1,000 mg in sodium chloride 0.9 % 100 mL IVPB  Status:  Discontinued        1 g 200 mL/hr over 30 Minutes Intravenous  Once 01/28/22 1541 01/28/22 1549        Current Facility-Administered Medications  Medication Dose Route Frequency Provider Last Rate Last Admin   acetaminophen (TYLENOL) tablet 650 mg  650 mg Oral Q6H PRN Marcelyn Bruins, MD   650 mg at 01/29/22 0019   Or   acetaminophen (TYLENOL) suppository 650 mg  650 mg Rectal Q6H PRN Marcelyn Bruins, MD       cefTRIAXone (ROCEPHIN) 1 g in sodium chloride 0.9 % 100 mL IVPB  1 g Intravenous Q24H Marcelyn Bruins, MD       dorzolamide-timolol (COSOPT) 22.3-6.8 MG/ML ophthalmic solution 1 drop  1 drop Both Eyes BID Marcelyn Bruins, MD   1 drop at 01/28/22 2322   enoxaparin (LOVENOX) injection 40 mg  40 mg Subcutaneous Q24H Marcelyn Bruins, MD   40 mg at 01/28/22 2321   feeding supplement (ENSURE ENLIVE / ENSURE PLUS) liquid 237 mL  237 mL Oral BID BM Marcelyn Bruins, MD       losartan (COZAAR) tablet 50 mg  50 mg Oral Daily Marcelyn Bruins, MD       And   hydrochlorothiazide (HYDRODIURIL) tablet 12.5 mg  12.5 mg Oral Daily Marcelyn Bruins, MD       latanoprost (XALATAN) 0.005 % ophthalmic solution 1 drop  1 drop Both Eyes QHS Marcelyn Bruins, MD   1 drop at 01/28/22 2322   levothyroxine (SYNTHROID) tablet 100 mcg  100 mcg Oral QAC breakfast Marcelyn Bruins, MD   100 mcg at 01/29/22 0603   metFORMIN (GLUCOPHAGE-XR) 24 hr tablet 500 mg  500 mg Oral Q supper Marcelyn Bruins, MD   500 mg at 01/29/22 0013   metoprolol succinate (TOPROL-XL) 24 hr tablet 50 mg  50 mg Oral Daily Marcelyn Bruins, MD       Oral care mouth rinse  15 mL Mouth Rinse PRN Marcelyn Bruins, MD       polyethylene glycol (MIRALAX / GLYCOLAX) packet 17 g  17 g Oral Daily PRN Marcelyn Bruins, MD       potassium chloride SA (KLOR-CON M) CR tablet 40 mEq  40 mEq Oral Q1 Hr x 3 Adhikari, Amrit, MD   40 mEq at 01/29/22 0822   pravastatin (PRAVACHOL)  tablet 20 mg  20 mg Oral Daily Marcelyn Bruins, MD       sodium chloride flush (NS) 0.9 % injection 3 mL  3 mL Intravenous Q12H  Marcelyn Bruins, MD   3 mL at 01/28/22 2329     Objective: Vital signs in last 24 hours: BP 135/62 (BP Location: Right Arm)   Pulse 80   Temp 99.6 F (37.6 C) (Oral)   Resp 19   Ht '5\' 3"'$  (1.6 m)   Wt 89.8 kg   SpO2 95%   BMI 35.07 kg/m   Intake/Output from previous day: 08/26 0701 - 08/27 0700 In: 240 [P.O.:240] Out: -  Intake/Output this shift: No intake/output data recorded.   Physical Exam Vitals reviewed.  Constitutional:      Appearance: Normal appearance. She is obese.  Cardiovascular:     Rate and Rhythm: Normal rate and regular rhythm.     Heart sounds: Normal heart sounds.  Pulmonary:     Effort: Pulmonary effort is normal. No respiratory distress.     Breath sounds: Normal breath sounds.  Abdominal:     General: Abdomen is flat.     Palpations: Abdomen is soft. There is no mass.     Tenderness: There is no abdominal tenderness. There is no right CVA tenderness or left CVA tenderness.  Musculoskeletal:        General: No swelling or tenderness. Normal range of motion.  Skin:    General: Skin is warm and dry.  Neurological:     General: No focal deficit present.     Mental Status: She is alert and oriented to person, place, and time.  Psychiatric:        Mood and Affect: Mood normal.        Behavior: Behavior normal.     Lab Results:  Results for orders placed or performed during the hospital encounter of 01/28/22 (from the past 24 hour(s))  Urinalysis, Routine w reflex microscopic Urine, Clean Catch     Status: Abnormal   Collection Time: 01/28/22  2:55 PM  Result Value Ref Range   Color, Urine YELLOW YELLOW   APPearance CLOUDY (A) CLEAR   Specific Gravity, Urine 1.026 1.005 - 1.030   pH 6.0 5.0 - 8.0   Glucose, UA NEGATIVE NEGATIVE mg/dL   Hgb urine dipstick LARGE (A) NEGATIVE   Bilirubin Urine NEGATIVE NEGATIVE   Ketones, ur NEGATIVE NEGATIVE mg/dL   Protein, ur >300 (A) NEGATIVE mg/dL   Nitrite NEGATIVE NEGATIVE   Leukocytes,Ua LARGE  (A) NEGATIVE   RBC / HPF >50 (H) 0 - 5 RBC/hpf   WBC, UA >50 (H) 0 - 5 WBC/hpf   Bacteria, UA MANY (A) NONE SEEN   Squamous Epithelial / LPF 21-50 0 - 5   WBC Clumps PRESENT    Mucus PRESENT   Culture, blood (routine x 2)     Status: None (Preliminary result)   Collection Time: 01/28/22  4:05 PM   Specimen: BLOOD LEFT ARM  Result Value Ref Range   Specimen Description      BLOOD LEFT ARM Performed at [facility], [address] [place], [place] [address]    Special Requests      BOTTLES DRAWN AEROBIC AND ANAEROBIC Blood Culture adequate volume Performed at [facility], [address], [place], [place] [address]    Culture      NO GROWTH < 24 HOURS Performed at [facility], [address],  [place] [address]    Report Status PENDING   CBC with Differential     Status: Abnormal   Collection Time: 01/28/22  4:10 PM  Result Value Ref Range   WBC 13.1 (H) 4.0 - 10.5 K/uL   RBC 4.63 3.87 - 5.11 MIL/uL   Hemoglobin 14.5 12.0 - 15.0 g/dL   HCT 41.5 36.0 - 46.0 %   MCV 89.6 80.0 - 100.0 fL   MCH 31.3 26.0 - 34.0 pg   MCHC 34.9 30.0 - 36.0 g/dL   RDW 12.6 11.5 - 15.5 %   Platelets 175 150 - 400 K/uL   nRBC 0.0 0.0 - 0.2 %   Neutrophils Relative % 77 %   Neutro Abs 10.1 (H) 1.7 - 7.7 K/uL   Lymphocytes Relative 11 %   Lymphs Abs 1.4 0.7 - 4.0 K/uL   Monocytes Relative 11 %   Monocytes Absolute 1.4 (H) 0.1 - 1.0 K/uL   Eosinophils Relative 0 %   Eosinophils Absolute 0.0 0.0 - 0.5 K/uL   Basophils Relative 0 %   Basophils Absolute 0.0 0.0 - 0.1 K/uL   Immature Granulocytes 1 %   Abs Immature Granulocytes 0.06 0.00 - 0.07 K/uL  Comprehensive metabolic panel     Status: Abnormal   Collection Time: 01/28/22  4:10 PM  Result Value Ref Range   Sodium 133 (L) 135 - 145 mmol/L   Potassium 3.6 3.5 - 5.1 mmol/L   Chloride 96 (L) 98 - 111 mmol/L   CO2 28 22 - 32 mmol/L   Glucose, Bld 122 (H) 70 - 99 mg/dL   BUN 23 8 - 23  mg/dL   Creatinine, Ser 0.93 0.44 - 1.00 mg/dL   Calcium 9.3 8.9 - 10.3 mg/dL   Total Protein 6.7 6.5 - 8.1 g/dL   Albumin 3.7 3.5 - 5.0 g/dL   AST 45 (H) 15 - 41 U/L   ALT 27 0 - 44 U/L   Alkaline Phosphatase 49 38 - 126 U/L   Total Bilirubin 1.1 0.3 - 1.2 mg/dL   GFR, Estimated >60 >60 mL/min   Anion gap 9 5 - 15  Culture, blood (routine x 2)     Status: None (Preliminary result)   Collection Time: 01/28/22  4:15 PM   Specimen: BLOOD RIGHT ARM  Result Value Ref Range   Specimen Description      BLOOD RIGHT ARM Performed at [facility], [address] [place], [place] [address]    Special Requests      BOTTLES DRAWN AEROBIC ONLY Blood Culture adequate volume Performed at [facility], [address], [place], [place] [address]    Culture      NO GROWTH < 24 HOURS Performed at [facility], [address], [place], [place] [address]    Report Status PENDING   Glucose, capillary     Status: Abnormal   Collection Time: 01/28/22 11:19 PM  Result Value Ref Range   Glucose-Capillary 183 (H) 70 - 99 mg/dL  Basic metabolic panel     Status: Abnormal   Collection Time: 01/29/22  5:20 AM  Result Value Ref Range   Sodium 140 135 - 145 mmol/L   Potassium 2.8 (L) 3.5 - 5.1 mmol/L   Chloride 103 98 - 111 mmol/L   CO2 28 22 - 32 mmol/L   Glucose, Bld 104 (H) 70 - 99 mg/dL   BUN 18 8 - 23 mg/dL   Creatinine, Ser 0.94 0.44 - 1.00  mg/dL   Calcium 8.6 (L) 8.9 - 10.3 mg/dL   GFR, Estimated >60 >60 mL/min   Anion gap 9 5 - 15  CBC     Status: Abnormal   Collection Time: 01/29/22  5:20 AM  Result Value Ref Range   WBC 10.6 (H) 4.0 - 10.5 K/uL   RBC 4.45 3.87 - 5.11 MIL/uL   Hemoglobin 14.0 12.0 - 15.0 g/dL   HCT 41.0 36.0 - 46.0 %   MCV 92.1 80.0 - 100.0 fL   MCH 31.5 26.0 - 34.0 pg   MCHC 34.1 30.0 - 36.0 g/dL   RDW 12.6 11.5 - 15.5 %   Platelets 170 150 - 400 K/uL   nRBC 0.0 0.0 - 0.2 %  Magnesium     Status: None   Collection  Time: 01/29/22  7:41 AM  Result Value Ref Range   Magnesium 2.0 1.7 - 2.4 mg/dL    BMET Recent Labs    01/28/22 1610 01/29/22 0520  NA 133* 140  K 3.6 2.8*  CL 96* 103  CO2 28 28  GLUCOSE 122* 104*  BUN 23 18  CREATININE 0.93 0.94  CALCIUM 9.3 8.6*   PT/INR No results for input(s): "LABPROT", "INR" in the last 72 hours. ABG No results for input(s): "PHART", "HCO3" in the last 72 hours.  Invalid input(s): "PCO2", "PO2"  Studies/Results: CT Abdomen Pelvis Wo Contrast  Result Date: 01/28/2022 CLINICAL DATA:  Abdominal pain, chills, and fever for 2 weeks. Recent urinary tract infection. EXAM: CT ABDOMEN AND PELVIS WITHOUT CONTRAST TECHNIQUE: Multidetector CT imaging of the abdomen and pelvis was performed following the standard protocol without IV contrast. RADIATION DOSE REDUCTION: This exam was performed according to the departmental dose-optimization program which includes automated exposure control, adjustment of the mA and/or kV according to patient size and/or use of iterative reconstruction technique. COMPARISON:  None Available. FINDINGS: Lower chest: No acute findings. Hepatobiliary: No mass visualized on this unenhanced exam. Gallbladder is unremarkable. No evidence of biliary ductal dilatation. Pancreas: No mass or inflammatory process visualized on this unenhanced exam. Spleen:  Within normal limits in size. Adrenals/Urinary tract: Several right renal calculi are seen. Largest calculus is seen in the right renal pelvis measures 1.8 cm. No ureteral calculi identified. Mild right renal pelvicaliectasis and perinephric stranding may be due to obstructive uropathy or pyelonephritis. Unremarkable unopacified urinary bladder. Stomach/Bowel: No evidence of obstruction, inflammatory process, or abnormal fluid collections. Normal appendix visualized. Vascular/Lymphatic: No pathologically enlarged lymph nodes identified. No evidence of abdominal aortic aneurysm. Aortic atherosclerotic  calcification incidentally noted. Reproductive: Prior hysterectomy noted. Adnexal regions are unremarkable in appearance. Other:  None. Musculoskeletal:  No suspicious bone lesions identified. IMPRESSION: Right renal calculi, largest in renal pelvis measuring 1.8 cm. Mild right renal pelvicaliectasis and perinephric stranding, which may be due to obstructive uropathy or pyelonephritis. Electronically Signed   By: Marlaine Hind M.D.   On: 01/28/2022 17:34     Assessment/Plan: Right partial staghorn stone with febrile UTI.   She has no flank pain or obstuction.  She will need a right PCNL when recovered from infection but I don't see a need for acute intervention.  I have reviewed the risks of PCNL and will include information in the patient instructions.  She will need to be seen in our office to arrange the procedure.          No follow-ups on file.    CC: Dr. Shelly Coss.     Irine Seal 01/29/2022 [id number]

## 2022-01-30 DIAGNOSIS — N12 Tubulo-interstitial nephritis, not specified as acute or chronic: Secondary | ICD-10-CM | POA: Diagnosis not present

## 2022-01-30 LAB — BASIC METABOLIC PANEL
Anion gap: 8 (ref 5–15)
BUN: 15 mg/dL (ref 8–23)
CO2: 27 mmol/L (ref 22–32)
Calcium: 8.8 mg/dL — ABNORMAL LOW (ref 8.9–10.3)
Chloride: 108 mmol/L (ref 98–111)
Creatinine, Ser: 0.64 mg/dL (ref 0.44–1.00)
GFR, Estimated: >60 mL/min (ref >60)
Glucose, Bld: 114 mg/dL — ABNORMAL HIGH (ref 70–99)
Potassium: 4 mmol/L (ref 3.5–5.1)
Sodium: 143 mmol/L (ref 135–145)

## 2022-01-30 LAB — CBC
HCT: 40.4 % (ref 36.0–46.0)
Hemoglobin: 13.8 g/dL (ref 12.0–15.0)
MCH: 31.7 pg (ref 26.0–34.0)
MCHC: 34.2 g/dL (ref 30.0–36.0)
MCV: 92.9 fL (ref 80.0–100.0)
Platelets: 183 10*3/uL (ref 150–400)
RBC: 4.35 MIL/uL (ref 3.87–5.11)
RDW: 12.5 % (ref 11.5–15.5)
WBC: 7.2 10*3/uL (ref 4.0–10.5)
nRBC: 0 % (ref 0.0–0.2)

## 2022-01-30 MED ORDER — SODIUM CHLORIDE 0.9 % IV SOLN
2.0000 g | INTRAVENOUS | Status: DC
  Administered 2022-01-30 – 2022-01-31 (×2): 2 g via INTRAVENOUS
  Filled 2022-01-30 (×2): qty 20

## 2022-01-30 NOTE — Evaluation (Signed)
Occupational Therapy Evaluation Patient Details Name: Melissa Davenport MRN: [id number] DOB: [birth year] Today's Date: 01/30/2022   History of Present Illness Patient is a 81 year old female who presented to the hosptial with fever, chills and malasie. patient was found to have sepsis, UTI, pyelonephritis, and urolithiasis. PMH: graves disease,obesity, glaucoma   Clinical Impression   Patient evaluated by Occupational Therapy with no further acute OT needs identified. All education has been completed and the patient has no further questions. Patient is at baseline for ADLs per patient report. Patient reported being slower but able to complete. Patient was educated on ECT See below for any follow-up Occupational Therapy or equipment needs. OT is signing off. Thank you for this referral.       Recommendations for follow up therapy are one component of a multi-disciplinary discharge planning process, led by the attending physician.  Recommendations may be updated based on patient status, additional functional criteria and insurance authorization.   Follow Up Recommendations  No OT follow up    Assistance Recommended at Discharge PRN  Patient can return home with the following Assistance with cooking/housework    Functional Status Assessment  Patient has not had a recent decline in their functional status  Equipment Recommendations       Recommendations for Other Services       Precautions / Restrictions Precautions Precautions: Fall Restrictions Weight Bearing Restrictions: No      Mobility Bed Mobility Overal bed mobility: Modified Independent                  Transfers Overall transfer level: Modified independent                        Balance Overall balance assessment: Modified Independent                                         ADL either performed or assessed with clinical judgement   ADL Overall ADL's : Modified independent                                        General ADL Comments: patient repoted using sock aid at home and was educated on long handled sponge to better access feet for hygiene tasks. patient verbalized understanding. patient was educated on tub transfer bench with computer in room used to show bench. patient reported she would look into getting one. patient was able to complete toileting simulated tasks with MI with increased time. patient was able to particiapte in shampoo cap sitting EOB with increased time. patient reported daughters would offer assistance in next level of care as needed. patient endorsed being at baseline for ADLs at this time     Vision Baseline Vision/History: 1 Wears glasses Patient Visual Report: No change from baseline       Perception     Praxis      Pertinent Vitals/Pain Pain Assessment Pain Assessment: No/denies pain     Hand Dominance     Extremity/Trunk Assessment Upper Extremity Assessment Upper Extremity Assessment: Defer to OT evaluation   Lower Extremity Assessment Lower Extremity Assessment: Generalized weakness   Cervical / Trunk Assessment Cervical / Trunk Assessment: Normal   Communication     Cognition Arousal/Alertness: Awake/alert Behavior During Therapy: [facility] for  tasks assessed/performed Overall Cognitive Status: Within Functional Limits for tasks assessed                                       General Comments       Exercises     Shoulder Instructions      Home Living Family/patient expects to be discharged to:: Private residence Living Arrangements: Alone Available Help at Discharge: Family;Available PRN/intermittently Type of Home: House Home Access: Stairs to enter [employer] of Steps: a couple Entrance Stairs-Rails: Right Home Layout: One level     Bathroom Shower/Tub: Tub/shower unit                    Prior Functioning/Environment Prior Level of Function :  Independent/Modified Independent                        OT Problem List:        OT Treatment/Interventions: Self-care/ADL training;Patient/family education    OT Goals(Current goals can be found in the care plan section) Acute Rehab OT Goals OT Goal Formulation: All assessment and education complete, DC therapy  OT Frequency:      Co-evaluation              AM-PAC OT "6 Clicks" Daily Activity     Outcome Measure Help from another person eating meals?: None Help from another person taking care of personal grooming?: None Help from another person toileting, which includes using toliet, bedpan, or urinal?: None Help from another person bathing (including washing, rinsing, drying)?: None Help from another person to put on and taking off regular upper body clothing?: None Help from another person to put on and taking off regular lower body clothing?: None 6 Click Score: 24   End of Session Equipment Utilized During Treatment: Gait belt Nurse Communication: Mobility status  Activity Tolerance: Patient tolerated treatment well Patient left: in bed;with call bell/phone within reach                   Time: 1454-1516 OT Time Calculation (min): 22 min Charges:  OT General Charges $OT Visit: 1 Visit OT Evaluation $OT Eval Low Complexity: 1 Low   OTR/L, MS Acute Rehabilitation Department Office# [phone]   Marcellina Millin 01/30/2022, 4:00 PM

## 2022-01-30 NOTE — Evaluation (Signed)
Physical Therapy Evaluation Patient Details Name: Melissa Davenport MRN: [id number] DOB: 18-Nov-1940 Today's Date: 01/30/2022  History of Present Illness  Patient is a 81 year old female who presented to the hosptial with fever, chills and malasie. patient was found to have sepsis, UTI, pyelonephritis, and urolithiasis. PMH: graves disease,obesity, glaucoma  Clinical Impression  On eval, pt mobilized with Close supervision to Mod Independence. She walked ~250 feet in the hallway without a device. She was intermittently unsteady and occasionally had to reach out for the handrail. She does report she uses a cane PRN at baseline. She has been mobilizing in her room unassisted. Do not anticipate any f/u PT needs after discharge. Recommend hallway ambulation to increase activity.      Recommendations for follow up therapy are one component of a multi-disciplinary discharge planning process, led by the attending physician.  Recommendations may be updated based on patient status, additional functional criteria and insurance authorization.  Follow Up Recommendations No PT follow up      Assistance Recommended at Discharge None  Patient can return home with the following  A little help with walking and/or transfers    Equipment Recommendations None recommended by PT  Recommendations for Other Services  OT consult    Functional Status Assessment Patient has had a recent decline in their functional status and demonstrates the ability to make significant improvements in function in a reasonable and predictable amount of time.     Precautions / Restrictions Precautions Precautions: Fall Restrictions Weight Bearing Restrictions: No      Mobility  Bed Mobility Overal bed mobility: Modified Independent                  Transfers Overall transfer level: Modified independent                      Ambulation/Gait Ambulation/Gait assistance: Close Supervision Gait Distance  (Feet): 250 Feet Assistive device: None Gait Pattern/deviations: Step-through pattern, Decreased stride length       General Gait Details: Intermittent unsteadiness with pt occasionally reaching out for handrail. At baseline, pt reports she does use a cane in the community.  Stairs            Wheelchair Mobility    Modified Rankin (Stroke Patients Only)       Balance Overall balance assessment: Needs assistance         Standing balance support: Bilateral upper extremity supported, Reliant on assistive device for balance Standing balance-Leahy Scale: Poor                               Pertinent Vitals/Pain Pain Assessment Pain Assessment: No/denies pain    Home Living Family/patient expects to be discharged to:: Private residence Living Arrangements: Alone Available Help at Discharge: Family;Available PRN/intermittently Type of Home: House Home Access: Stairs to enter Entrance Stairs-Rails: Right Entrance Stairs-Number of Steps: a couple   Home Layout: One level        Prior Function Prior Level of Function : Independent/Modified Independent                     Hand Dominance        Extremity/Trunk Assessment   Upper Extremity Assessment Upper Extremity Assessment: Defer to OT evaluation    Lower Extremity Assessment Lower Extremity Assessment: Generalized weakness    Cervical / Trunk Assessment Cervical / Trunk Assessment: Normal  Communication  Cognition Arousal/Alertness: Awake/alert Behavior During Therapy: WFL for tasks assessed/performed Overall Cognitive Status: Within Functional Limits for tasks assessed                                          General Comments      Exercises     Assessment/Plan    PT Assessment Patient needs continued PT services  PT Problem List Decreased balance       PT Treatment Interventions DME instruction;Gait training;Therapeutic activities;Therapeutic  exercise;Patient/family education;Balance training;Functional mobility training    PT Goals (Current goals can be found in the Care Plan section)  Acute Rehab PT Goals Patient Stated Goal: home tomorrow PT Goal Formulation: With patient Time For Goal Achievement: 02/13/22 Potential to Achieve Goals: Good    Frequency Min 3X/week     Co-evaluation               AM-PAC PT "6 Clicks" Mobility  Outcome Measure Help needed turning from your back to your side while in a flat bed without using bedrails?: None Help needed moving from lying on your back to sitting on the side of a flat bed without using bedrails?: None Help needed moving to and from a bed to a chair (including a wheelchair)?: None Help needed standing up from a chair using your arms (e.g., wheelchair or bedside chair)?: None Help needed to walk in hospital room?: A Little Help needed climbing 3-5 steps with a railing? : A Little 6 Click Score: 22    End of Session   Activity Tolerance: Patient tolerated treatment well Patient left: with call bell/phone within reach (in room with OT)   PT Visit Diagnosis: Unsteadiness on feet (R26.81)    Time: 1504-1364 PT Time Calculation (min) (ACUTE ONLY): 9 min   Charges:   PT Evaluation $PT Eval Low Complexity: 1 Low             Doreatha Massed, PT Acute Rehabilitation  Office: [phone] Pager: [phone]

## 2022-01-30 NOTE — Progress Notes (Signed)
PROGRESS NOTE  Melissa Davenport  PZW:[id number] DOB: [birth year] DOA: 01/28/2022 PCP: Eilene Ghazi, NP   Brief Narrative:  Patient is a 81 year old female with history of hypothyroidism, glaucoma, hypertension, diabetes type 2, hyperlipidemia who presented from home with complaints of malaise, fever and chills.  This was ongoing for last 5 to 7 days.  Patient was febrile up to 101 F at home.  She was seen at urgent care and was diagnosed to have urinary tract infection and was prescribed cefdinir.  Patient continued to have symptoms, he spiked fever at home so presented to the emergency department for further evaluation. On presentation ,she was hemodynamically stable.  Lab work showed leukocytosis of 13.1.  UA was consistent with UTI with significant leukocytes, bacteria.  CT abdomen/pelvis was done which showed right-sided renal calculi ,one was 1.8 cm.  Kidney function was normal.  Patient was admitted for the management of complicated UTI.  Started on ceftriaxone.  Urine culture, blood culture sent.  Urology consulted with plan for outpatient follow-up.  Pending PT/OT evaluation  Assessment & Plan:  Principal Problem:   Pyelonephritis Active Problems:   Flajani disease   BP (high blood pressure)   Glaucoma suspect of both eyes   Body mass index (BMI) 34.0-34.9, adult   Mixed hyperlipidemia   Postablative hypothyroidism   Type 2 diabetes mellitus with other specified complication (HCC)   Sepsis/pyelonephritis: Presented with fever, chills.  Was taking oral antibiotics which did not help.  CT abdomen/pelvis showed changes consistent with pyelonephritis/possibly obstructive uropathy with renal stone.  Started on ceftriaxone.  Urine culture, blood culture sent.  Urine culture showed insignificant growth.  Blood culture still pending.  She had a fever of 100.1 F at 8 PM yesterday Continue gentle IV fluids.  Urolithiasis: 2 stones were seen on the right side.  1 measuring 1.8 cm in the  pelvis.  No obstructive changes with normal renal function.  Urology consulted, plan for percutaneous nephrolithotomy when she recovers from pyelonephritis and will be followed by urology in the office.  Severe hypokalemia: Supplemented and corrected  History of hypertension: Takes losartan, hydrochlorothiazide, metoprolol.  Currently blood pressure stable.  Continue metoprolol.  Hypothyroidism: Continue home Synthyroid  Hyperlipidemia: Continue pravastatin  Diabetes type 2: Continue to monitor blood sugars.  On sliding scale.  Takes metformin at home.  History of glaucoma: Continue home eyedrops  Obesity: BMI 35.07  General weakness: Patient complains of weakness.  PT/OT consulted         DVT prophylaxis:enoxaparin (LOVENOX) injection 40 mg Start: 01/28/22 2200     Code Status: Full Code  Family Communication: Daughter at bedside on 8/28  Patient status: Inpatient   Patient is from :Home  Anticipated discharge UM:PNTI  Estimated DC date: 1 to 2 days   Consultants: urology  Procedures:None  Antimicrobials:  Anti-infectives (From admission, onward)    Start     Dose/Rate Route Frequency Ordered Stop   01/30/22 1000  cefTRIAXone (ROCEPHIN) 2 g in sodium chloride 0.9 % 100 mL IVPB        2 g 200 mL/hr over 30 Minutes Intravenous Every 24 hours 01/30/22 0834     01/29/22 1000  cefTRIAXone (ROCEPHIN) 1 g in sodium chloride 0.9 % 100 mL IVPB  Status:  Discontinued        1 g 200 mL/hr over 30 Minutes Intravenous Every 24 hours 01/28/22 2012 01/30/22 0834   01/28/22 1615  cefTRIAXone (ROCEPHIN) 2 g in sodium chloride 0.9 % 100 mL IVPB  2 g 200 mL/hr over 30 Minutes Intravenous  Once 01/28/22 1604 01/28/22 1700   01/28/22 1600  meropenem (MERREM) 1 g in sodium chloride 0.9 % 100 mL IVPB  Status:  Discontinued        1 g 200 mL/hr over 30 Minutes Intravenous  Once 01/28/22 1549 01/28/22 1603   01/28/22 1545  ertapenem (INVANZ) 1,000 mg in sodium chloride 0.9 %  100 mL IVPB  Status:  Discontinued        1 g 200 mL/hr over 30 Minutes Intravenous  Once 01/28/22 1541 01/28/22 1549       Subjective:  Patient seen and examined the bedside this morning.  Hemodynamically stable.  She had a fever of 100.1 F at 8 PM yesterday.  She was afebrile this morning.  She still feels weak.  Appears anxious.  Denies any worsening abdominal pain, nausea or vomiting   Objective: Vitals:   01/29/22 0936 01/29/22 1300 01/29/22 2014 01/30/22 0445  BP: (!) 132/51 (!) 126/53 (!) 132/54 118/60  Pulse: 83 78 79 68  Resp: '18 18 20 20  '$ Temp: 98.7 F (37.1 C) 99.4 F (37.4 C) 100.1 F (37.8 C) 98 F (36.7 C)  TempSrc: Oral Oral Oral Oral  SpO2: 93% 95% 95% 96%  Weight:      Height:        Intake/Output Summary (Last 24 hours) at 01/30/2022 1152 Last data filed at 01/30/2022 1047 Gross per 24 hour  Intake 590 ml  Output --  Net 590 ml   Filed Weights   01/28/22 1452  Weight: 89.8 kg    Examination:  General exam: Weak appearing, lying in bed, obese HEENT: PERRL Respiratory system:  no wheezes or crackles  Cardiovascular system: S1 & S2 heard, RRR.  Gastrointestinal system: Abdomen is nondistended, soft and nontender. Central nervous system: Alert and oriented Extremities: No edema, no clubbing ,no cyanosis Skin: No rashes, no ulcers,no icterus    Data Reviewed: I have personally reviewed following labs and imaging studies  CBC: Recent Labs  Lab 01/28/22 1610 01/29/22 0520 01/30/22 0421  WBC 13.1* 10.6* 7.2  NEUTROABS 10.1*  --   --   HGB 14.5 14.0 13.8  HCT 41.5 41.0 40.4  MCV 89.6 92.1 92.9  PLT 175 170 008   Basic Metabolic Panel: Recent Labs  Lab 01/28/22 1610 01/29/22 0520 01/29/22 0741 01/30/22 0421  NA 133* 140  --  143  K 3.6 2.8*  --  4.0  CL 96* 103  --  108  CO2 28 28  --  27  GLUCOSE 122* 104*  --  114*  BUN 23 18  --  15  CREATININE 0.93 0.94  --  0.64  CALCIUM 9.3 8.6*  --  8.8*  MG  --   --  2.0  --       Recent Results (from the past 240 hour(s))  Urine Culture     Status: Abnormal   Collection Time: 01/28/22  2:55 PM   Specimen: Urine, Clean Catch  Result Value Ref Range Status   Specimen Description   Final    URINE, CLEAN CATCH Performed at [facility], [address] [place], [place] [address]    Special Requests   Final    NONE Performed at [facility], [address] [place], [place] [address]    Culture (A)  Final    <10,000 COLONIES/mL INSIGNIFICANT GROWTH Performed at [facility], [place],  [place] [address]    Report Status 01/29/2022 FINAL  Final  Culture, blood (routine x 2)     Status: None (Preliminary result)   Collection Time: 01/28/22  4:05 PM   Specimen: BLOOD LEFT ARM  Result Value Ref Range Status   Specimen Description   Final    BLOOD LEFT ARM Performed at [facility], [address], [place], [place] [address]    Special Requests   Final    BOTTLES DRAWN AEROBIC AND ANAEROBIC Blood Culture adequate volume Performed at [facility], [address] [place], [place] [address]    Culture   Final    NO GROWTH 2 DAYS Performed at [facility], [place] [address], [place], [place] [address]    Report Status PENDING  Incomplete  Culture, blood (routine x 2)     Status: None (Preliminary result)   Collection Time: 01/28/22  4:15 PM   Specimen: BLOOD RIGHT ARM  Result Value Ref Range Status   Specimen Description   Final    BLOOD RIGHT ARM Performed at [facility], [address] [place], [place] [address]    Special Requests   Final    BOTTLES DRAWN AEROBIC ONLY Blood Culture adequate volume Performed at [facility], [address], [place], [place] [address]    Culture   Final    NO GROWTH 2 DAYS Performed at [facility], [place] [address] [place],  [address]    Report Status  PENDING  Incomplete     Radiology Studies: CT Abdomen Pelvis Wo Contrast  Result Date: 01/28/2022 CLINICAL DATA:  Abdominal pain, chills, and fever for 2 weeks. Recent urinary tract infection. EXAM: CT ABDOMEN AND PELVIS WITHOUT CONTRAST TECHNIQUE: Multidetector CT imaging of the abdomen and pelvis was performed following the standard protocol without IV contrast. RADIATION DOSE REDUCTION: This exam was performed according to the departmental dose-optimization program which includes automated exposure control, adjustment of the mA and/or kV according to patient size and/or use of iterative reconstruction technique. COMPARISON:  None Available. FINDINGS: Lower chest: No acute findings. Hepatobiliary: No mass visualized on this unenhanced exam. Gallbladder is unremarkable. No evidence of biliary ductal dilatation. Pancreas: No mass or inflammatory process visualized on this unenhanced exam. Spleen:  Within normal limits in size. Adrenals/Urinary tract: Several right renal calculi are seen. Largest calculus is seen in the right renal pelvis measures 1.8 cm. No ureteral calculi identified. Mild right renal pelvicaliectasis and perinephric stranding may be due to obstructive uropathy or pyelonephritis. Unremarkable unopacified urinary bladder. Stomach/Bowel: No evidence of obstruction, inflammatory process, or abnormal fluid collections. Normal appendix visualized. Vascular/Lymphatic: No pathologically enlarged lymph nodes identified. No evidence of abdominal aortic aneurysm. Aortic atherosclerotic calcification incidentally noted. Reproductive: Prior hysterectomy noted. Adnexal regions are unremarkable in appearance. Other:  None. Musculoskeletal:  No suspicious bone lesions identified. IMPRESSION: Right renal calculi, largest in renal pelvis measuring 1.8 cm. Mild right renal pelvicaliectasis and perinephric stranding, which may be due to obstructive uropathy or pyelonephritis. Electronically Signed   By: Marlaine Hind M.D.   On: 01/28/2022 17:34    Scheduled Meds:  bimatoprost  1 drop Both Eyes QHS   dorzolamide-timolol  1 drop Both Eyes BID   enoxaparin (LOVENOX) injection  40 mg Subcutaneous Q24H   feeding supplement  237 mL Oral BID BM   losartan  50 mg Oral Daily   And   hydrochlorothiazide  12.5 mg Oral Daily   levothyroxine  100  mcg Oral QAC breakfast   metFORMIN  500 mg Oral Q supper   metoprolol succinate  50 mg Oral Daily   pravastatin  20 mg Oral Daily   sodium chloride flush  3 mL Intravenous Q12H   Continuous Infusions:  cefTRIAXone (ROCEPHIN)  IV Stopped (01/30/22 1047)     LOS: 1 day   Shelly Coss, MD [facility] P8/28/2023, 11:52 AM

## 2022-01-31 DIAGNOSIS — N12 Tubulo-interstitial nephritis, not specified as acute or chronic: Secondary | ICD-10-CM | POA: Diagnosis not present

## 2022-01-31 MED ORDER — CEFDINIR 300 MG PO CAPS: 300.0000 mg | ORAL_CAPSULE | Freq: Two times a day (BID) | ORAL | 0 refills | Status: AC

## 2022-01-31 NOTE — Plan of Care (Signed)
  Problem: Clinical Measurements: Goal: Ability to maintain clinical measurements within normal limits will improve Outcome: Adequate for Discharge   Problem: Elimination: Goal: Will not experience complications related to urinary retention Outcome: Adequate for Discharge   Problem: Pain Managment: Goal: General experience of comfort will improve Outcome: Adequate for Discharge   Problem: Safety: Goal: Ability to remain free from injury will improve Outcome: Adequate for Discharge   Problem: Skin Integrity: Goal: Risk for impaired skin integrity will decrease Outcome: Adequate for Discharge

## 2022-01-31 NOTE — Discharge Summary (Signed)
Physician Discharge Summary  Melissa Davenport HWE:[id number] DOB: [birth year] DOA: 01/28/2022  PCP: Faustino Congress, NP  Admit date: 01/28/2022 Discharge date: 01/31/2022  Admitted From: Home Disposition:  Home  Discharge Condition:Stable CODE STATUS:FULL Diet recommendation: Heart Healthy  Brief/Interim Summary: Patient is a 81 year old female with history of hypothyroidism, glaucoma, hypertension, diabetes type 2, hyperlipidemia who presented from home with complaints of malaise, fever and chills.  This was ongoing for last 5 to 7 days.  Patient was febrile up to 101 F at home.  She was seen at urgent care and was diagnosed to have urinary tract infection and was prescribed cefdinir.  Patient continued to have symptoms, he spiked fever at home so presented to the emergency department for further evaluation. On presentation ,she was hemodynamically stable.  Lab work showed leukocytosis of 13.1.  UA was consistent with UTI with significant leukocytes, bacteria.  CT abdomen/pelvis was done which showed right-sided renal calculi ,one was 1.8 cm.  Kidney function was normal.  Patient was admitted for the management of complicated UTI.  Started on ceftriaxone.  Urine culture, blood culture sent.  Urology consulted with plan for outpatient follow-up.  Hospital course remarkable for intermittent fever.  Cultures have been negative so far.  She is medically stable for discharge to home with oral antibiotics.  She will follow-up with urology as an outpatient.  Following problems were addressed during her hospitalization:  Sepsis/pyelonephritis: Presented with fever, chills.  Was taking oral antibiotics which did not help.  CT abdomen/pelvis showed changes consistent with pyelonephritis/possibly obstructive uropathy with renal stone.  Started on ceftriaxone.Urine culture showed insignificant growth.  Blood culture NGTD.  Continue antibiotics for 3 more days to complete 7 days course.   Urolithiasis: 2  stones were seen on the right side.  1 measuring 1.8 cm in the pelvis.  No obstructive changes with normal renal function.  Urology consulted, plan for percutaneous nephrolithotomy when she recovers from pyelonephritis and will be followed by urology in the office.   Severe hypokalemia: Supplemented and corrected   History of hypertension: Takes losartan, hydrochlorothiazide, metoprolol.  Currently blood pressure stable.     Hypothyroidism: Continue home Synthyroid   Hyperlipidemia: Continue pravastatin   Diabetes type 2:   Takes metformin at home.   History of glaucoma: Continue home eyedrops   Obesity: BMI 35.07   General weakness: Patient complains of weakness.  PT/OT consulted,no follow up recommended.      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  Call for appointment 3)Follow up with your PCP in 1 to 2 weeks   Increase activity slowly   Complete by: As directed       Allergies as of 01/31/2022       Reactions   Brimonidine Swelling, Other (See Comments)   Redness   Flagyl [metronidazole] Other (See Comments)   Causes dizziness   Ketek [telithromycin] Other (See Comments)  Causes anxiety and GI upset   Levaquin [levofloxacin] Nausea Only        Medication List     TAKE these medications    aspirin 81 MG tablet Take 81 mg by mouth daily.   bimatoprost 0.01 % Soln Commonly known as:  LUMIGAN Place 1 drop into both eyes at bedtime.   cefdinir 300 MG capsule Commonly known as: OMNICEF Take 1 capsule (300 mg total) by mouth 2 (two) times daily for 3 days. Start taking on: February 01, 2022   cetirizine 10 MG tablet Commonly known as: ZYRTEC Take 1 tablet by mouth daily as needed for allergies.   Cholecalciferol 25 MCG (1000 UT) tablet Take 1 tablet by mouth daily.   dorzolamide-timolol 22.3-6.8 MG/ML ophthalmic solution Commonly known as: COSOPT Place 1 drop into both eyes 2 (two) times daily.   fluticasone 50 MCG/ACT nasal spray Commonly known as: FLONASE Place 1 spray into both nostrils daily as needed for allergies. Use only needed   levothyroxine 100 MCG tablet Commonly known as: SYNTHROID Take 100 mcg by mouth daily before breakfast.   losartan-hydrochlorothiazide 50-12.5 MG tablet Commonly known as: HYZAAR Take 1 tablet by mouth daily.   metFORMIN 500 MG 24 hr tablet Commonly known as: GLUCOPHAGE-XR Take 500 mg by mouth at bedtime. With dinner   metoprolol succinate 50 MG 24 hr tablet Commonly known as: TOPROL-XL Take 50 mg by mouth daily. Take with or immediately following a meal.   Multi-Vitamins Tabs Take 1 tablet by mouth daily.   niacin 500 MG tablet Commonly known as: VITAMIN B3 Take 1 tablet by mouth daily.   Omega-3 1000 MG Caps Take 3,000 mg by mouth daily.   pravastatin 20 MG tablet Commonly known as: PRAVACHOL Take 20 mg by mouth daily.        Follow-up Information     [facility] Follow up.   Why: Please call to arrange a follow up appointment with Dr. Diona Fanti, Dr. Jeffie Pollock or one of the nurse practitioners in the next 1-2 weeks after discharge. Contact information: [place]        Faustino Congress, NP. Schedule an appointment as soon as possible for a visit in 1 week(s).   Specialty: Family Medicine Contact information: [place] [address] [phone]         Elouise Munroe, MD .   Specialties: Cardiology, Radiology Contact information: [address] [place] [address] [place] [address] [phone]                Allergies  Allergen Reactions   Brimonidine Swelling and Other (See Comments)    Redness   Flagyl [Metronidazole] Other (See Comments)    Causes dizziness   Ketek [Telithromycin] Other (See Comments)    Causes anxiety and GI upset   Levaquin [Levofloxacin] Nausea Only    Consultations: Urology   Procedures/Studies: CT Abdomen Pelvis Wo Contrast  Result Date: 01/28/2022 CLINICAL DATA:  Abdominal pain, chills, and fever for 2 weeks. Recent urinary tract infection. EXAM: CT ABDOMEN AND PELVIS WITHOUT CONTRAST TECHNIQUE: Multidetector CT imaging of the abdomen and pelvis was performed following the standard protocol without IV contrast. RADIATION DOSE REDUCTION: This exam was performed according to the departmental dose-optimization program which includes automated exposure control, adjustment of the mA and/or kV according to patient size and/or use of iterative reconstruction technique. COMPARISON:  None Available. FINDINGS: Lower chest: No acute findings. Hepatobiliary:  No mass visualized on this unenhanced exam. Gallbladder is unremarkable. No evidence of biliary ductal dilatation. Pancreas: No mass or inflammatory process visualized on this unenhanced exam. Spleen:  Within normal limits in size. Adrenals/Urinary tract: Several right renal calculi are seen. Largest calculus is seen in the right renal pelvis measures 1.8 cm. No ureteral calculi identified. Mild right renal pelvicaliectasis and perinephric stranding may be due to obstructive uropathy or pyelonephritis. Unremarkable unopacified urinary bladder. Stomach/Bowel: No evidence of obstruction, inflammatory process, or abnormal fluid collections. Normal appendix visualized. Vascular/Lymphatic: No pathologically  enlarged lymph nodes identified. No evidence of abdominal aortic aneurysm. Aortic atherosclerotic calcification incidentally noted. Reproductive: Prior hysterectomy noted. Adnexal regions are unremarkable in appearance. Other:  None. Musculoskeletal:  No suspicious bone lesions identified. IMPRESSION: Right renal calculi, largest in renal pelvis measuring 1.8 cm. Mild right renal pelvicaliectasis and perinephric stranding, which may be due to obstructive uropathy or pyelonephritis. Electronically Signed   By: Marlaine Hind M.D.   On: 01/28/2022 17:34      Subjective: Patient seen and examined at the bedside this morning.  Hemodynamically stable for discharge today.  Discharge planning discussed with daughter at bedside  Discharge Exam: Vitals:   01/30/22 2244 01/31/22 0238  BP: 137/60 133/73  Pulse: 75 75  Resp: 20 16  Temp: 98.5 F (36.9 C) 98.4 F (36.9 C)  SpO2: 94% 96%   Vitals:   01/30/22 0445 01/30/22 1415 01/30/22 2244 01/31/22 0238  BP: 118/60 137/62 137/60 133/73  Pulse: 68 69 75 75  Resp: '20 20 20 16  '$ Temp: 98 F (36.7 C) 97.6 F (36.4 C) 98.5 F (36.9 C) 98.4 F (36.9 C)  TempSrc: Oral Oral Oral Oral  SpO2: 96% 96% 94% 96%  Weight:      Height:        General: Pt is alert, awake, not in acute distress Cardiovascular: RRR, S1/S2 +, no rubs, no gallops Respiratory: CTA bilaterally, no wheezing, no rhonchi Abdominal: Soft, NT, ND, bowel sounds + Extremities: no edema, no cyanosis    The results of significant diagnostics from this hospitalization (including imaging, microbiology, ancillary and laboratory) are listed below for reference.     Microbiology: Recent Results (from the past 240 hour(s))  Urine Culture     Status: Abnormal   Collection Time: 01/28/22  2:55 PM   Specimen: Urine, Clean Catch  Result Value Ref Range Status   Specimen Description   Final    URINE, CLEAN CATCH Performed at [facility], [address],  [place], [place] [address]    Special Requests   Final    NONE Performed at [facility], [address] [place], [place] [address]    Culture (A)  Final    <10,000 COLONIES/mL INSIGNIFICANT GROWTH Performed at [facility] [address], [place], [place] [address]    Report Status 01/29/2022 FINAL  Final  Culture, blood (routine x 2)     Status: None (Preliminary result)   Collection Time: 01/28/22  4:05 PM   Specimen: BLOOD LEFT ARM  Result Value Ref Range Status   Specimen Description   Final    BLOOD LEFT ARM Performed at [facility], [address], [place], [place] [address]    Special Requests   Final    BOTTLES DRAWN AEROBIC AND ANAEROBIC Blood Culture adequate volume Performed at [facility], [address] [place],  [address]    Culture   Final    NO GROWTH  3 DAYS Performed at [facility], [place] [address] [place], [place] [address]    Report Status PENDING  Incomplete  Culture, blood (routine x 2)     Status: None (Preliminary result)   Collection Time: 01/28/22  4:15 PM   Specimen: BLOOD RIGHT ARM  Result Value Ref Range Status   Specimen Description   Final    BLOOD RIGHT ARM Performed at [facility], [address] [place], [place] [address]    Special Requests   Final    BOTTLES DRAWN AEROBIC ONLY Blood Culture adequate volume Performed at [facility], [address] [place], [place] [address]    Culture   Final    NO GROWTH 3 DAYS Performed at [facility], [place] [address] [place], [place] [address]    Report Status PENDING  Incomplete     Labs: BNP (last 3 results) No results for input(s): "BNP" in the last 8760 hours. Basic Metabolic Panel: Recent Labs  Lab 01/28/22 1610 01/29/22 0520 01/29/22 0741 01/30/22 0421  NA 133* 140  --  143  K 3.6 2.8*  --  4.0  CL 96* 103  --  108  CO2 28 28  --  27  GLUCOSE 122* 104*   --  114*  BUN 23 18  --  15  CREATININE 0.93 0.94  --  0.64  CALCIUM 9.3 8.6*  --  8.8*  MG  --   --  2.0  --    Liver Function Tests: Recent Labs  Lab 01/28/22 1610  AST 45*  ALT 27  ALKPHOS 49  BILITOT 1.1  PROT 6.7  ALBUMIN 3.7   No results for input(s): "LIPASE", "AMYLASE" in the last 168 hours. No results for input(s): "AMMONIA" in the last 168 hours. CBC: Recent Labs  Lab 01/28/22 1610 01/29/22 0520 01/30/22 0421  WBC 13.1* 10.6* 7.2  NEUTROABS 10.1*  --   --   HGB 14.5 14.0 13.8  HCT 41.5 41.0 40.4  MCV 89.6 92.1 92.9  PLT 175 170 183   Cardiac Enzymes: No results for input(s): "CKTOTAL", "CKMB", "CKMBINDEX", "TROPONINI" in the last 168 hours. BNP: Invalid input(s): "POCBNP" CBG: Recent Labs  Lab 01/28/22 2319  GLUCAP 183*   D-Dimer No results for input(s): "DDIMER" in the last 72 hours. Hgb A1c No results for input(s): "HGBA1C" in the last 72 hours. Lipid Profile No results for input(s): "CHOL", "HDL", "LDLCALC", "TRIG", "CHOLHDL", "LDLDIRECT" in the last 72 hours. Thyroid function studies No results for input(s): "TSH", "T4TOTAL", "T3FREE", "THYROIDAB" in the last 72 hours.  Invalid input(s): "FREET3" Anemia work up No results for input(s): "VITAMINB12", "FOLATE", "FERRITIN", "TIBC", "IRON", "RETICCTPCT" in the last 72 hours. Urinalysis    Component Value Date/Time   COLORURINE YELLOW 01/28/2022 1455   APPEARANCEUR CLOUDY (A) 01/28/2022 1455   LABSPEC 1.026 01/28/2022 1455   PHURINE 6.0 01/28/2022 1455   GLUCOSEU NEGATIVE 01/28/2022 1455   HGBUR LARGE (A) 01/28/2022 1455   BILIRUBINUR NEGATIVE 01/28/2022 1455   KETONESUR NEGATIVE 01/28/2022 1455   PROTEINUR >300 (A) 01/28/2022 1455   NITRITE NEGATIVE 01/28/2022 1455   LEUKOCYTESUR LARGE (A) 01/28/2022 1455   Sepsis Labs Recent Labs  Lab 01/28/22 1610 01/29/22 0520 01/30/22 0421  WBC 13.1* 10.6* 7.2   Microbiology Recent Results (from the past 240 hour(s))  Urine Culture      Status: Abnormal   Collection Time: 01/28/22  2:55 PM   Specimen: Urine, Clean Catch  Result Value  Ref Range Status   Specimen Description   Final    URINE, CLEAN CATCH Performed at [facility], [address] [place], [place] [address]    Special Requests   Final    NONE Performed at [facility], [address] [place], [place] [address]    Culture (A)  Final    <10,000 COLONIES/mL INSIGNIFICANT GROWTH Performed at [facility] [address] [place], [place] [address]    Report Status 01/29/2022 FINAL  Final  Culture, blood (routine x 2)     Status: None (Preliminary result)   Collection Time: 01/28/22  4:05 PM   Specimen: BLOOD LEFT ARM  Result Value Ref Range Status   Specimen Description   Final    BLOOD LEFT ARM Performed at [facility], [address], [place], [place] [address]    Special Requests   Final    BOTTLES DRAWN AEROBIC AND ANAEROBIC Blood Culture adequate volume Performed at [facility], [address] [place], [place] [address]    Culture   Final    NO GROWTH 3 DAYS Performed at [facility], [place] [address] [place], [place] [address]    Report Status PENDING  Incomplete  Culture, blood (routine x 2)     Status: None (Preliminary result)   Collection Time: 01/28/22  4:15 PM   Specimen: BLOOD RIGHT ARM  Result Value Ref Range Status   Specimen Description   Final    BLOOD RIGHT ARM Performed at [facility], [address], [place], [place] [address]    Special Requests   Final    BOTTLES DRAWN AEROBIC ONLY Blood Culture adequate volume Performed at [facility], [address] [place], [place] [address]    Culture   Final    NO GROWTH 3 DAYS Performed at [facility], [place] [address], [place], [place] [address]    Report Status PENDING  Incomplete    Please note: You were cared for by a hospitalist  during your hospital stay. Once you are discharged, your primary care physician will handle any further medical issues. Please note that NO REFILLS for any discharge medications will be authorized once you are discharged, as it is imperative that you return to your primary care physician (or establish a relationship with a primary care physician if you do not have one) for your post hospital discharge needs so that they can reassess your need for medications and monitor your lab values.    Time coordinating discharge: 40 minutes  SIGNED:   Shelly Coss, MD  [facility] 01/31/2022, 10:41 AM Pager [phone]  If 7PM-7AM, please contact night-coverage [email or web address] Password [email or web address]

## 2022-01-31 NOTE — Progress Notes (Signed)
  Transition of Care [facility]) Screening Note   Patient Details  Name: CANISHA ISSAC Date of Birth: 1940/10/14   Transition of Care [facility]) CM/SW Contact:    Dessa Phi, RN Phone Number: 01/31/2022, 11:37 AM    Transition of Care Department [facility]) has reviewed patient and no TOC needs have been identified at this time. We will continue to monitor patient advancement through interdisciplinary progression rounds. If new patient transition needs arise, please place a TOC consult.

## 2022-02-02 LAB — CULTURE, BLOOD (ROUTINE X 2)
Culture: NO GROWTH
Culture: NO GROWTH
Special Requests: ADEQUATE
Special Requests: ADEQUATE

## 2022-02-08 DIAGNOSIS — R8271 Bacteriuria: Secondary | ICD-10-CM | POA: Diagnosis not present

## 2022-02-08 DIAGNOSIS — N200 Calculus of kidney: Secondary | ICD-10-CM | POA: Diagnosis not present

## 2022-02-15 ENCOUNTER — Other Ambulatory Visit: Payer: Self-pay | Admitting: Urology

## 2022-02-15 ENCOUNTER — Other Ambulatory Visit (HOSPITAL_COMMUNITY): Payer: Self-pay | Admitting: Urology

## 2022-02-15 DIAGNOSIS — N200 Calculus of kidney: Secondary | ICD-10-CM

## 2022-02-16 DIAGNOSIS — Z8619 Personal history of other infectious and parasitic diseases: Secondary | ICD-10-CM | POA: Diagnosis not present

## 2022-02-16 DIAGNOSIS — N12 Tubulo-interstitial nephritis, not specified as acute or chronic: Secondary | ICD-10-CM | POA: Diagnosis not present

## 2022-02-16 DIAGNOSIS — N200 Calculus of kidney: Secondary | ICD-10-CM | POA: Diagnosis not present

## 2022-02-16 DIAGNOSIS — N390 Urinary tract infection, site not specified: Secondary | ICD-10-CM | POA: Diagnosis not present

## 2022-03-17 DIAGNOSIS — Z1389 Encounter for screening for other disorder: Secondary | ICD-10-CM | POA: Diagnosis not present

## 2022-03-17 DIAGNOSIS — E1169 Type 2 diabetes mellitus with other specified complication: Secondary | ICD-10-CM | POA: Diagnosis not present

## 2022-03-17 DIAGNOSIS — Z0000 Encounter for general adult medical examination without abnormal findings: Secondary | ICD-10-CM | POA: Diagnosis not present

## 2022-03-17 DIAGNOSIS — Z6834 Body mass index (BMI) 34.0-34.9, adult: Secondary | ICD-10-CM | POA: Diagnosis not present

## 2022-03-20 DIAGNOSIS — Z79899 Other long term (current) drug therapy: Secondary | ICD-10-CM | POA: Diagnosis not present

## 2022-03-20 DIAGNOSIS — E782 Mixed hyperlipidemia: Secondary | ICD-10-CM | POA: Diagnosis not present

## 2022-03-20 DIAGNOSIS — E2839 Other primary ovarian failure: Secondary | ICD-10-CM | POA: Diagnosis not present

## 2022-03-20 DIAGNOSIS — E890 Postprocedural hypothyroidism: Secondary | ICD-10-CM | POA: Diagnosis not present

## 2022-03-20 DIAGNOSIS — M899 Disorder of bone, unspecified: Secondary | ICD-10-CM | POA: Diagnosis not present

## 2022-03-20 DIAGNOSIS — E1165 Type 2 diabetes mellitus with hyperglycemia: Secondary | ICD-10-CM | POA: Diagnosis not present

## 2022-03-20 DIAGNOSIS — Z6834 Body mass index (BMI) 34.0-34.9, adult: Secondary | ICD-10-CM | POA: Diagnosis not present

## 2022-03-20 DIAGNOSIS — I10 Essential (primary) hypertension: Secondary | ICD-10-CM | POA: Diagnosis not present

## 2022-03-20 DIAGNOSIS — H409 Unspecified glaucoma: Secondary | ICD-10-CM | POA: Diagnosis not present

## 2022-03-20 DIAGNOSIS — E559 Vitamin D deficiency, unspecified: Secondary | ICD-10-CM | POA: Diagnosis not present

## 2022-03-20 DIAGNOSIS — R809 Proteinuria, unspecified: Secondary | ICD-10-CM | POA: Diagnosis not present

## 2022-03-23 DIAGNOSIS — R051 Acute cough: Secondary | ICD-10-CM | POA: Diagnosis not present

## 2022-03-23 DIAGNOSIS — E782 Mixed hyperlipidemia: Secondary | ICD-10-CM | POA: Diagnosis not present

## 2022-03-23 DIAGNOSIS — M940 Chondrocostal junction syndrome [Tietze]: Secondary | ICD-10-CM | POA: Diagnosis not present

## 2022-03-23 DIAGNOSIS — B372 Candidiasis of skin and nail: Secondary | ICD-10-CM | POA: Diagnosis not present

## 2022-03-23 DIAGNOSIS — J205 Acute bronchitis due to respiratory syncytial virus: Secondary | ICD-10-CM | POA: Diagnosis not present

## 2022-03-23 DIAGNOSIS — J209 Acute bronchitis, unspecified: Secondary | ICD-10-CM | POA: Diagnosis not present

## 2022-03-23 DIAGNOSIS — Z6834 Body mass index (BMI) 34.0-34.9, adult: Secondary | ICD-10-CM | POA: Diagnosis not present

## 2022-03-27 ENCOUNTER — Encounter (HOSPITAL_COMMUNITY): Admission: RE | Admit: 2022-03-27 | Payer: PPO | Source: Ambulatory Visit

## 2022-03-28 IMAGING — MG MM DIGITAL SCREENING BILAT W/ TOMO AND CAD
6 of 10 series · 6 of 30 positions shown · non-contrast
Comparison: Previous exam(s).

CLINICAL DATA: Screening.

EXAM:
DIGITAL SCREENING BILATERAL MAMMOGRAM WITH TOMOSYNTHESIS AND CAD
TECHNIQUE: Bilateral screening digital craniocaudal and mediolateral oblique
mammograms were obtained. Bilateral screening digital breast
tomosynthesis was performed. The images were evaluated with
computer-aided detection.

[R CC synth-2D]
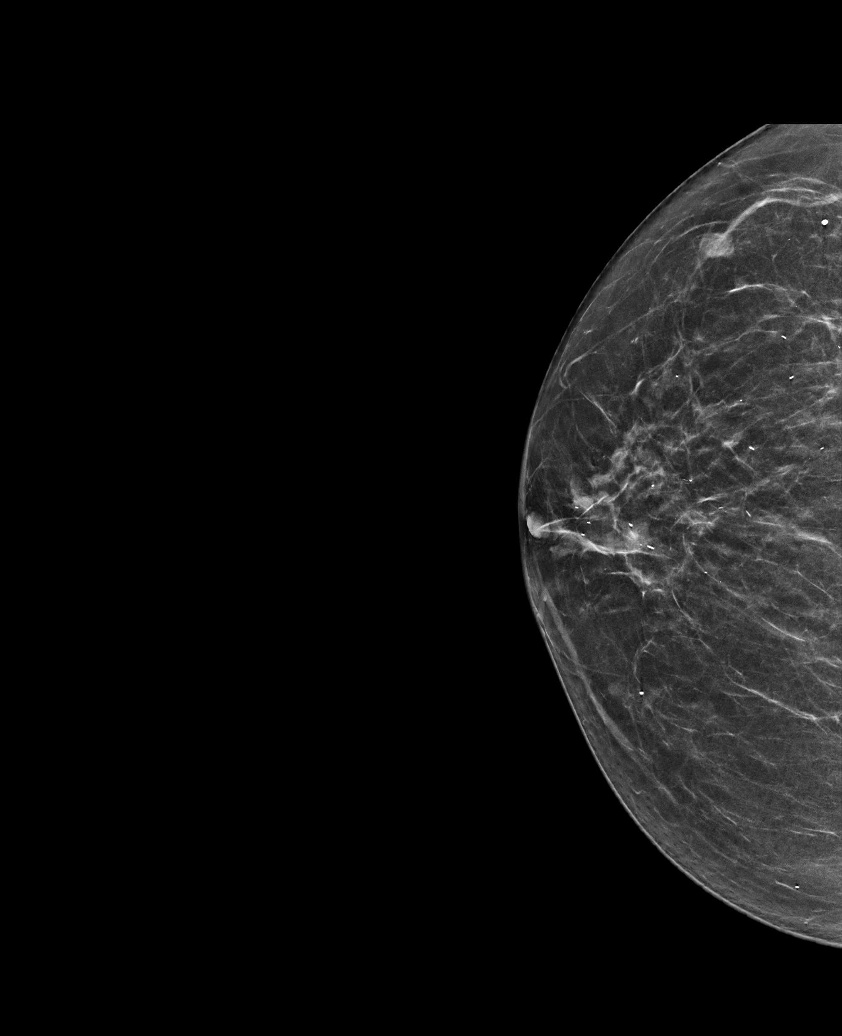

[L CC synth-2D]
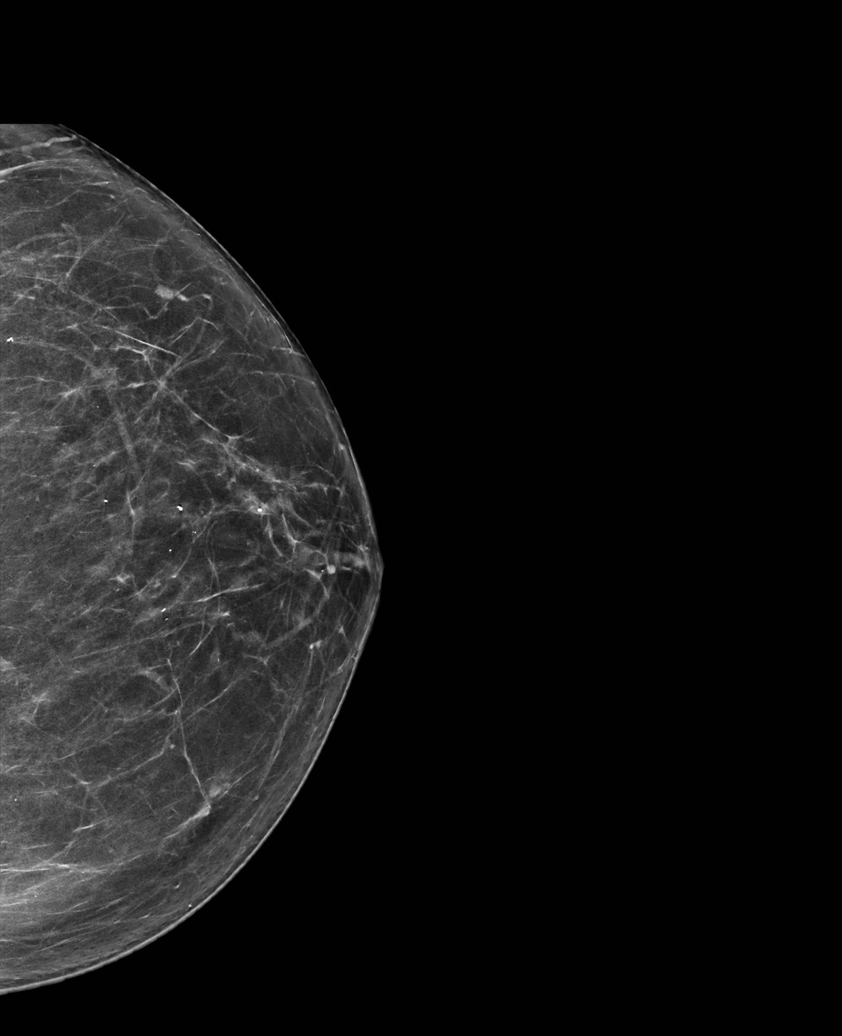

[R MLO synth-2D]
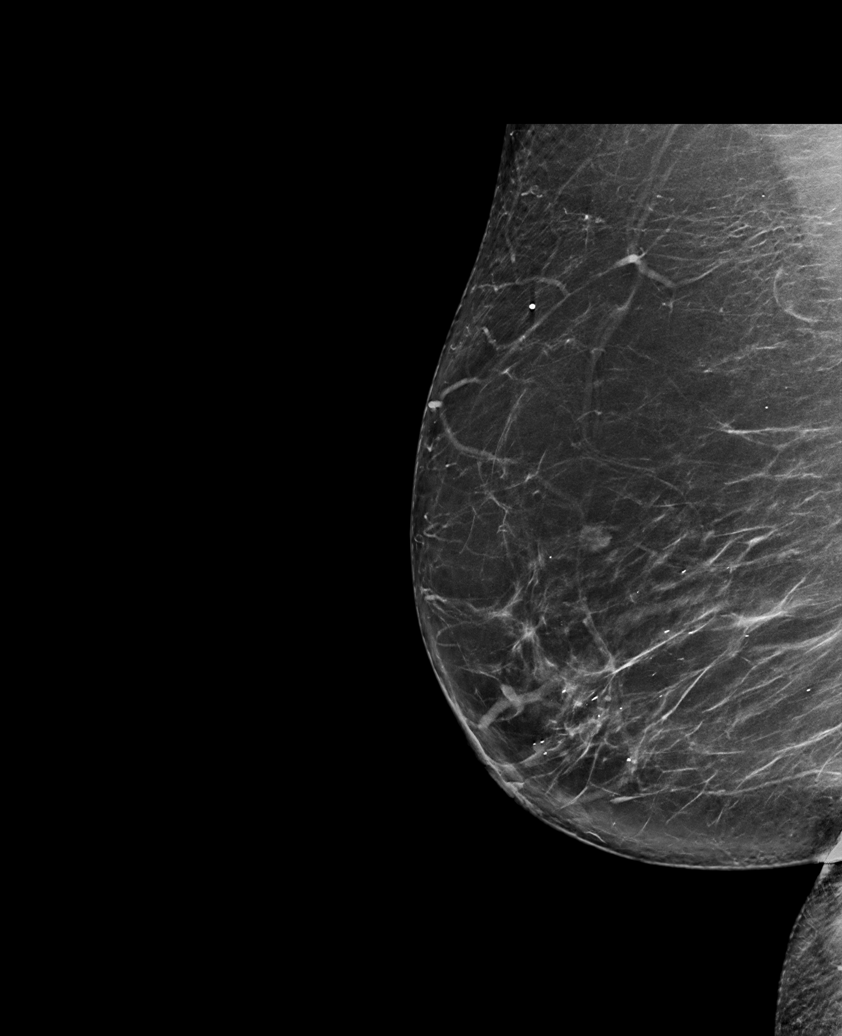

[R XCCL synth-2D]
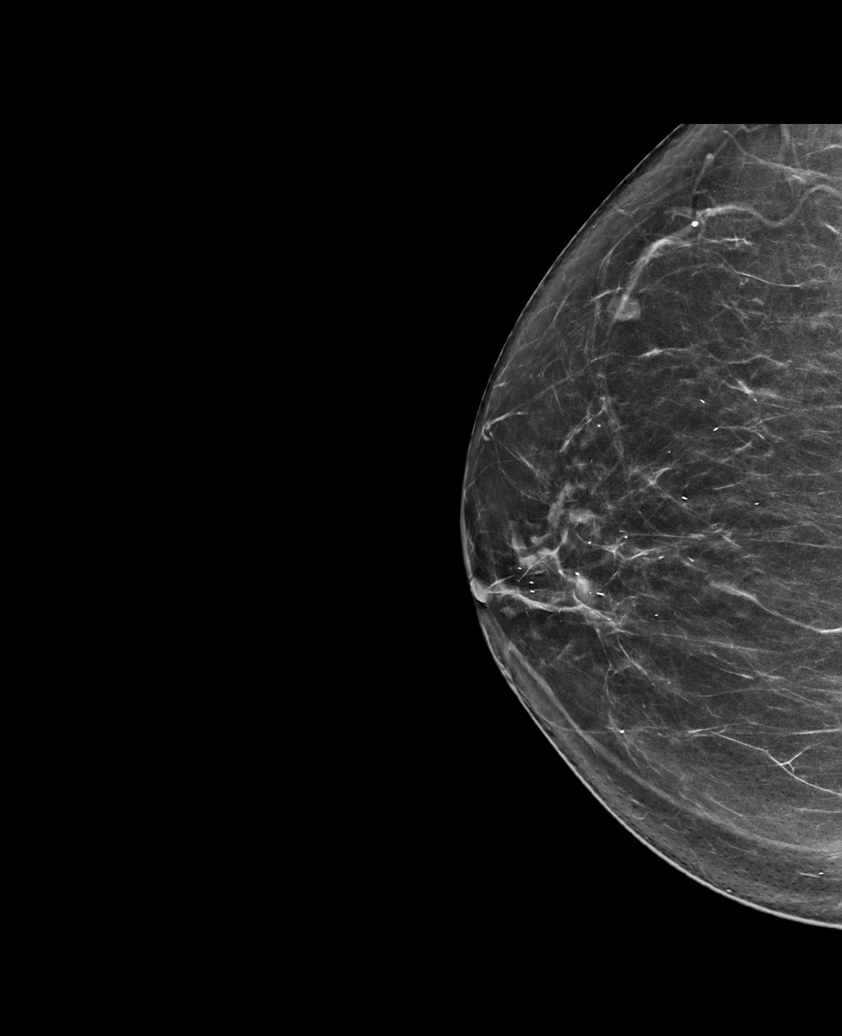

[L MLO synth-2D]
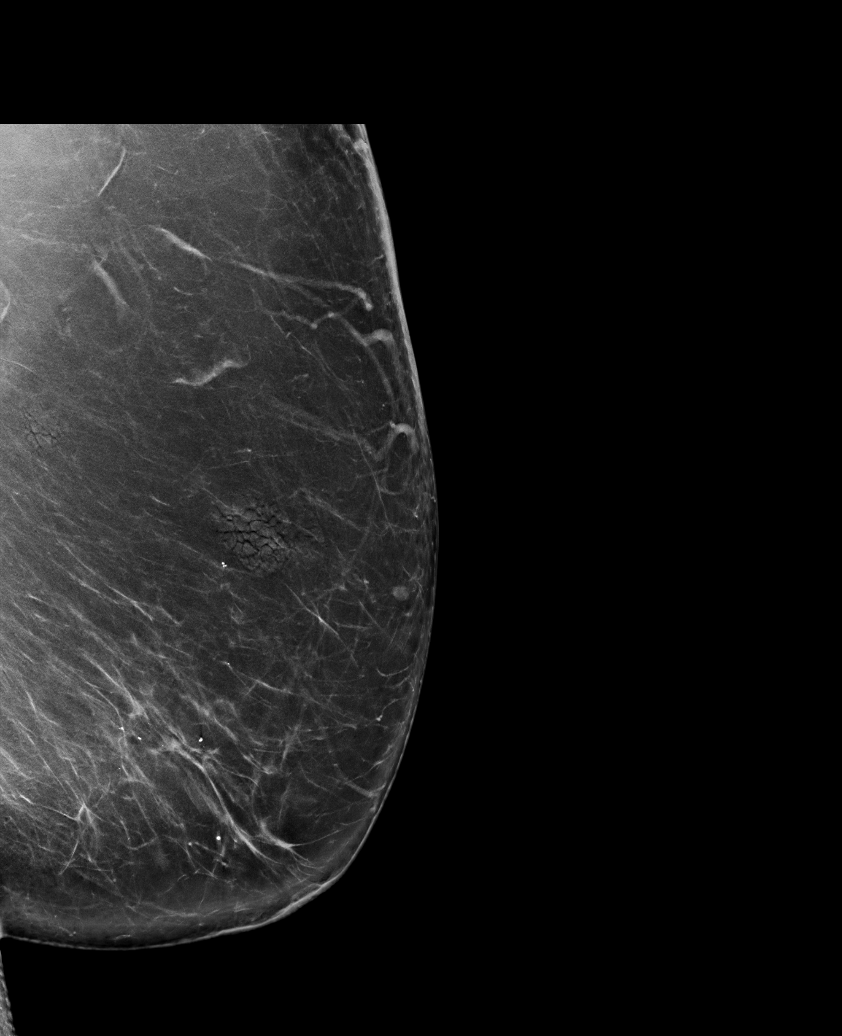

[L MLO tomo · tomo slice 51/100.0]
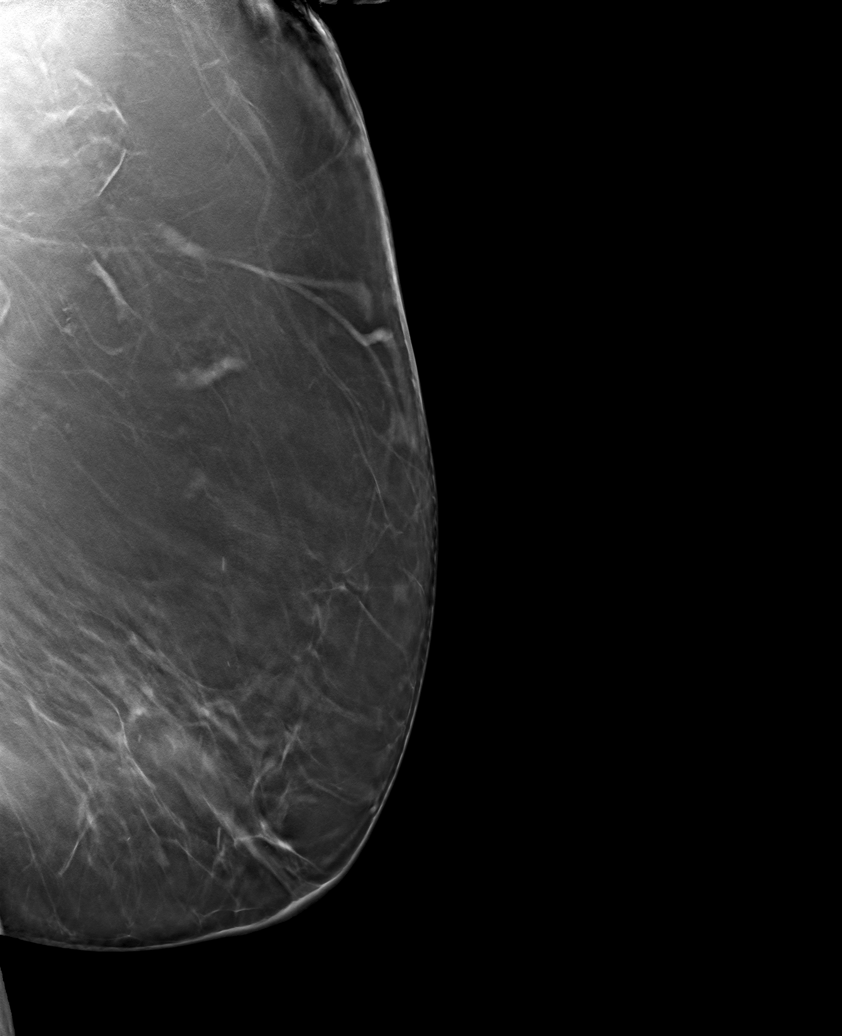

[6 of 30 positions shown; findings below may reference images not displayed]

ACR Breast Density Category b: There are scattered areas of
fibroglandular density.
FINDINGS: There are no findings suspicious for malignancy.
IMPRESSION: No mammographic evidence of malignancy. A result letter of this
screening mammogram will be mailed directly to the patient.

RECOMMENDATION:
Screening mammogram in one year. (Code:51-O-LD2)

BI-RADS CATEGORY  1: Negative.

## 2022-04-04 ENCOUNTER — Ambulatory Visit (HOSPITAL_COMMUNITY): Payer: PPO

## 2022-04-04 ENCOUNTER — Encounter (HOSPITAL_COMMUNITY): Payer: Self-pay

## 2022-04-04 ENCOUNTER — Ambulatory Visit: Admit: 2022-04-04 | Payer: PPO | Admitting: Urology

## 2022-04-04 ENCOUNTER — Other Ambulatory Visit (HOSPITAL_COMMUNITY): Payer: PPO

## 2022-04-04 SURGERY — NEPHROLITHOTOMY PERCUTANEOUS
Anesthesia: General | Laterality: Right

## 2022-04-06 ENCOUNTER — Ambulatory Visit: Payer: PPO | Admitting: Podiatry

## 2022-04-07 ENCOUNTER — Ambulatory Visit: Admit: 2022-04-07 | Payer: PPO | Admitting: Urology

## 2022-04-07 SURGERY — NEPHROLITHOTOMY PERCUTANEOUS SECOND LOOK
Anesthesia: General | Laterality: Right

## 2022-04-10 ENCOUNTER — Ambulatory Visit
Admission: RE | Admit: 2022-04-10 | Discharge: 2022-04-10 | Disposition: A | Discharge status: Home / Self Care | Payer: PPO | Source: Ambulatory Visit | Attending: Family Medicine | Admitting: Family Medicine

## 2022-04-10 ENCOUNTER — Other Ambulatory Visit: Payer: Self-pay | Admitting: Family Medicine

## 2022-04-10 DIAGNOSIS — Z6833 Body mass index (BMI) 33.0-33.9, adult: Secondary | ICD-10-CM | POA: Diagnosis not present

## 2022-04-10 DIAGNOSIS — M533 Sacrococcygeal disorders, not elsewhere classified: Secondary | ICD-10-CM | POA: Diagnosis not present

## 2022-04-10 DIAGNOSIS — E890 Postprocedural hypothyroidism: Secondary | ICD-10-CM | POA: Diagnosis not present

## 2022-04-10 DIAGNOSIS — N200 Calculus of kidney: Secondary | ICD-10-CM | POA: Diagnosis not present

## 2022-04-10 DIAGNOSIS — M545 Low back pain, unspecified: Secondary | ICD-10-CM

## 2022-04-10 DIAGNOSIS — E1165 Type 2 diabetes mellitus with hyperglycemia: Secondary | ICD-10-CM | POA: Diagnosis not present

## 2022-04-12 ENCOUNTER — Other Ambulatory Visit: Payer: Self-pay | Admitting: Urology

## 2022-04-14 NOTE — Progress Notes (Signed)
Anesthesia Review:  PCP: Cardiologist : Chest x-ray : EKG : 01/31/22  Echo : 2021  Stress test: Cardiac Cath : 2017   Activity level:  Sleep Study/ CPAP : Fasting Blood Sugar :      / Checks Blood Sugar -- times a day:   Blood Thinner/ Instructions /Last Dose: ASA / Instructions/ Last Dose :  DM- type  Hgba1c-

## 2022-04-17 DIAGNOSIS — N200 Calculus of kidney: Secondary | ICD-10-CM | POA: Diagnosis not present

## 2022-04-17 NOTE — Patient Instructions (Signed)
SURGICAL WAITING ROOM VISITATION Patients having surgery or a procedure may have no more than 2 support people in the waiting area - these visitors may rotate.   Children under the age of 89 must have an adult with them who is not the patient. If the patient needs to stay at the hospital during part of their recovery, the visitor guidelines for inpatient rooms apply. Pre-op nurse will coordinate an appropriate time for 1 support person to accompany patient in pre-op.  This support person may not rotate.    Please refer to the [facility] website for the visitor guidelines for Inpatients (after your surgery is over and you are in a regular room).       Your procedure is scheduled on: 05/02/22    Report to [facility] Main Entrance    Report to admitting at   0730 AM   Call this number if you have problems the morning of surgery [phone]   Do not eat food :After Midnight.   After Midnight you may have the following liquids until __0630____ AM  DAY OF SURGERY  Water Non-Citrus Juices (without pulp, NO RED) Carbonated Beverages Black Coffee (NO MILK/CREAM OR CREAMERS, sugar ok)  Clear Tea (NO MILK/CREAM OR CREAMERS, sugar ok) regular and decaf                             Plain Jell-O (NO RED)                                           Fruit ices (not with fruit pulp, NO RED)                                     Popsicles (NO RED)                                                               Sports drinks like Gatorade (NO RED)                  If you have questions, please contact your surgeon's office.       Oral Hygiene is also important to reduce your risk of infection.                                    Remember - BRUSH YOUR TEETH THE MORNING OF SURGERY WITH YOUR REGULAR TOOTHPASTE   Do NOT smoke after Midnight   Take these medicines the morning of surgery with A SIP OF WATER:  zyrtec, eye drops as usual synthroid, toprol   DO NOT TAKE ANY ORAL DIABETIC  MEDICATIONS DAY OF YOUR SURGERY  Bring CPAP mask and tubing day of surgery.                              You may not have any metal on your body including hair pins, jewelry, and body piercing  Do not wear make-up, lotions, powders, perfumes/cologne, or deodorant  Do not wear nail polish including gel and S&S, artificial/acrylic nails, or any other type of covering on natural nails including finger and toenails. If you have artificial nails, gel coating, etc. that needs to be removed by a nail salon please have this removed prior to surgery or surgery may need to be canceled/ delayed if the surgeon/ anesthesia feels like they are unable to be safely monitored.   Do not shave  48 hours prior to surgery.               Men may shave face and neck.   Do not bring valuables to the hospital. [place].   Contacts, dentures or bridgework may not be worn into surgery.   Bring small overnight bag day of surgery.   DO NOT [place]. PHARMACY WILL DISPENSE MEDICATIONS LISTED ON YOUR MEDICATION LIST TO YOU DURING YOUR ADMISSION [place]!    Patients discharged on the day of surgery will not be allowed to drive home.  Someone NEEDS to stay with you for the first 24 hours after anesthesia.   Special Instructions: Bring a copy of your healthcare power of attorney and living will documents the day of surgery if you haven't scanned them before.              Please read over the following fact sheets you were given: IF [place] [phone]   If you received a COVID test during your pre-op visit  it is requested that you wear a mask when out in public, stay away from anyone that may not be feeling well and notify your surgeon if you develop symptoms. If you test positive for Covid or have been in contact with anyone that has tested positive in the last  10 days please notify you surgeon.    [place] - Preparing for Surgery Before surgery, you can play an important role.  Because skin is not sterile, your skin needs to be as free of germs as possible.  You can reduce the number of germs on your skin by washing with CHG (chlorahexidine gluconate) soap before surgery.  CHG is an antiseptic cleaner which kills germs and bonds with the skin to continue killing germs even after washing. Please DO NOT use if you have an allergy to CHG or antibacterial soaps.  If your skin becomes reddened/irritated stop using the CHG and inform your nurse when you arrive at Short Stay. Do not shave (including legs and underarms) for at least 48 hours prior to the first CHG shower.  You may shave your face/neck. Please follow these instructions carefully:  1.  Shower with CHG Soap the night before surgery and the  morning of Surgery.  2.  If you choose to wash your hair, wash your hair first as usual with your  normal  shampoo.  3.  After you shampoo, rinse your hair and body thoroughly to remove the  shampoo.                           4.  Use CHG as you would any other liquid soap.  You can apply chg directly  to the skin and wash  Gently with a scrungie or clean washcloth.  5.  Apply the CHG Soap to your body ONLY FROM THE NECK DOWN.   Do not use on face/ open                           Wound or open sores. Avoid contact with eyes, ears mouth and genitals (private parts).                       Wash face,  Genitals (private parts) with your normal soap.             6.  Wash thoroughly, paying special attention to the area where your surgery  will be performed.  7.  Thoroughly rinse your body with warm water from the neck down.  8.  DO NOT shower/wash with your normal soap after using and rinsing off  the CHG Soap.                9.  Pat yourself dry with a clean towel.            10.  Wear clean pajamas.            11.  Place clean sheets on your  bed the night of your first shower and do not  sleep with pets. Day of Surgery : Do not apply any lotions/deodorants the morning of surgery.  Please wear clean clothes to the hospital/surgery center.  FAILURE TO FOLLOW THESE INSTRUCTIONS MAY RESULT IN THE CANCELLATION OF YOUR SURGERY PATIENT SIGNATURE_________________________________  NURSE SIGNATURE__________________________________  ________________________________________________________________________

## 2022-04-18 DIAGNOSIS — N200 Calculus of kidney: Secondary | ICD-10-CM | POA: Diagnosis not present

## 2022-04-19 ENCOUNTER — Other Ambulatory Visit: Payer: Self-pay

## 2022-04-19 ENCOUNTER — Encounter (HOSPITAL_COMMUNITY): Payer: Self-pay

## 2022-04-19 ENCOUNTER — Encounter (HOSPITAL_COMMUNITY)
Admission: RE | Admit: 2022-04-19 | Discharge: 2022-04-19 | Disposition: A | Discharge status: Home / Self Care | Payer: PPO | Source: Ambulatory Visit | Attending: Urology | Admitting: Urology

## 2022-04-19 VITALS — BP 135/55 | HR 74 | Temp 98.3°F | Resp 16 | Ht 63.0 in | Wt 181.0 lb

## 2022-04-19 DIAGNOSIS — E119 Type 2 diabetes mellitus without complications: Secondary | ICD-10-CM | POA: Insufficient documentation

## 2022-04-19 DIAGNOSIS — Z01818 Encounter for other preprocedural examination: Secondary | ICD-10-CM | POA: Insufficient documentation

## 2022-04-19 HISTORY — DX: Personal history of urinary calculi: Z87.442

## 2022-04-19 HISTORY — DX: Hypothyroidism, unspecified: E03.9

## 2022-04-19 HISTORY — DX: Other complications of anesthesia, initial encounter: T88.59XA

## 2022-04-19 LAB — CBC
HCT: 47 % — ABNORMAL HIGH (ref 36.0–46.0)
Hemoglobin: 15.9 g/dL — ABNORMAL HIGH (ref 12.0–15.0)
MCH: 31.2 pg (ref 26.0–34.0)
MCHC: 33.8 g/dL (ref 30.0–36.0)
MCV: 92.3 fL (ref 80.0–100.0)
Platelets: 250 10*3/uL (ref 150–400)
RBC: 5.09 MIL/uL (ref 3.87–5.11)
RDW: 12.5 % (ref 11.5–15.5)
WBC: 7.2 10*3/uL (ref 4.0–10.5)
nRBC: 0 % (ref 0.0–0.2)

## 2022-04-19 LAB — BASIC METABOLIC PANEL
Anion gap: 7 (ref 5–15)
BUN: 19 mg/dL (ref 8–23)
CO2: 27 mmol/L (ref 22–32)
Calcium: 9.3 mg/dL (ref 8.9–10.3)
Chloride: 104 mmol/L (ref 98–111)
Creatinine, Ser: 0.7 mg/dL (ref 0.44–1.00)
GFR, Estimated: >60 mL/min (ref >60)
Glucose, Bld: 170 mg/dL — ABNORMAL HIGH (ref 70–99)
Potassium: 3.6 mmol/L (ref 3.5–5.1)
Sodium: 138 mmol/L (ref 135–145)

## 2022-04-19 LAB — HEMOGLOBIN A1C
Hgb A1c MFr Bld: 5.8 % — ABNORMAL HIGH (ref 4.8–5.6)
Mean Plasma Glucose: 119.76 mg/dL

## 2022-04-19 LAB — TYPE AND SCREEN
ABO/RH(D): A POS
Antibody Screen: NEGATIVE

## 2022-04-19 LAB — GLUCOSE, CAPILLARY: Glucose-Capillary: 179 mg/dL — ABNORMAL HIGH (ref 70–99)

## 2022-04-20 ENCOUNTER — Ambulatory Visit: Payer: PPO | Admitting: Podiatry

## 2022-04-20 ENCOUNTER — Encounter: Payer: Self-pay | Admitting: Podiatry

## 2022-04-20 DIAGNOSIS — D2371 Other benign neoplasm of skin of right lower limb, including hip: Secondary | ICD-10-CM

## 2022-04-20 DIAGNOSIS — B351 Tinea unguium: Secondary | ICD-10-CM | POA: Diagnosis not present

## 2022-04-20 DIAGNOSIS — M79676 Pain in unspecified toe(s): Secondary | ICD-10-CM

## 2022-04-20 DIAGNOSIS — E119 Type 2 diabetes mellitus without complications: Secondary | ICD-10-CM

## 2022-04-20 DIAGNOSIS — D2372 Other benign neoplasm of skin of left lower limb, including hip: Secondary | ICD-10-CM

## 2022-04-20 NOTE — Progress Notes (Signed)
She presents today chief complaint of painful elongated toenails and painful calluses plantar aspect of bilateral foot.  Denies fever chills nausea mobic muscle aches pains extremity swelling RSV and a broken coccyx.  Objective: Vital signs are stable alert oriented x3.  Pulses are palpable.  There is no erythema edema salines drainage multiple reactive hyperkeratotic lesions plantar aspect of bilateral foot none No open lesions or wounds noted.  Toenails are long thick yellow dystrophic with mycotic and painful palpation.  Assessment: Benign skin lesions bilateral pain limb secondary onychomycosis.  Plan: Debridement of toenails 1 through 5 bilateral debridement of all reactive hyperkeratotic tissue bilateral.

## 2022-05-01 ENCOUNTER — Other Ambulatory Visit: Payer: Self-pay | Admitting: Radiology

## 2022-05-01 DIAGNOSIS — N200 Calculus of kidney: Secondary | ICD-10-CM

## 2022-05-01 NOTE — H&P (View-Only) (Signed)
GU PMH: Renal calculus - 02/08/2022      PMH Notes: Diabetes.  Kidney stones.   NON-GU PMH: Arthritis Glaucoma Hypertension Hyperthyroidism Hypothyroidism    FAMILY HISTORY: 2 daughters - Runs in Family heart - Father   SOCIAL HISTORY: Marital Status: Divorced Preferred Language: English; Ethnicity: Not Hispanic Or Latino; Race: White Current Smoking Status: Patient has never smoked.   Tobacco Use Assessment Completed: Used Tobacco in last 30 days? Has never drank.  Drinks 2 caffeinated drinks per day.    REVIEW OF SYSTEMS:    GU Review Female:   Patient reports frequent urination. Patient denies hard to postpone urination, burning /pain with urination, get up at night to urinate, leakage of urine, stream starts and stops, trouble starting your stream, have to strain to urinate, and being pregnant.  Gastrointestinal (Upper):   Patient denies vomiting, indigestion/ heartburn, and nausea.  Gastrointestinal (Lower):   Patient denies diarrhea and constipation.  Constitutional:   Patient reports fatigue. Patient denies fever, night sweats, and weight loss.  Skin:   Patient denies skin rash/ lesion and itching.  Eyes:   Patient denies blurred vision and double vision.  Ears/ Nose/ Throat:   Patient denies sore throat and sinus problems.  Hematologic/Lymphatic:   Patient denies swollen glands and easy bruising.  Cardiovascular:   Patient denies leg swelling and chest pains.  Respiratory:   Patient denies cough and shortness of breath.  Musculoskeletal:   Patient denies back pain and joint pain.  Neurological:   Patient denies headaches and dizziness.  Psychologic:   Patient denies depression and anxiety.   VITAL SIGNS:      04/17/2022 09:47 AM  BP 125/75 mmHg  Pulse 72 /min  Temperature 97.8 F / 36.5 C   MULTI-SYSTEM PHYSICAL EXAMINATION:    Constitutional: Well-nourished. No physical deformities. Normally developed. Good grooming.  Neck: Neck symmetrical, not swollen.  Normal tracheal position.  Respiratory: No labored breathing, no use of accessory muscles.   Cardiovascular: Regular rate and rhythm. No murmur, no gallop. Normal temperature, normal extremity pulses, no swelling, no varicosities.   Skin: No paleness, no jaundice, no cyanosis. No lesion, no ulcer, no rash.  Neurologic / Psychiatric: Oriented to time, oriented to place, oriented to person. No depression, no anxiety, no agitation.  Gastrointestinal: No mass, no tenderness, no rigidity, non obese abdomen.  Musculoskeletal: Normal gait and station of head and neck.     Complexity of Data:  Source Of History:  Patient  Records Review:   Previous Doctor Records, Previous Hospital Records, Previous Patient Records  Urine Test Review:   Urinalysis   04/17/22  Urinalysis  Urine Appearance Cloudy   Urine Color Yellow   Urine Glucose Neg mg/dL  Urine Bilirubin Neg mg/dL  Urine Ketones Neg mg/dL  Urine Specific Gravity 1.025   Urine Blood 3+ ery/uL  Urine pH 5.5   Urine Protein 2+ mg/dL  Urine Urobilinogen 0.2 mg/dL  Urine Nitrites Neg   Urine Leukocyte Esterase 3+ leu/uL  Urine WBC/hpf 20 - 40/hpf   Urine RBC/hpf 10 - 20/hpf   Urine Epithelial Cells 10 - 20/hpf   Urine Bacteria Rare (0-9/hpf)   Urine Mucous Not Present   Urine Yeast NS (Not Seen)   Urine Trichomonas Not Present   Urine Cystals NS (Not Seen)   Urine Casts NS (Not Seen)   Urine Sperm Not Present    PROCEDURES:          Urinalysis w/Scope Dipstick Dipstick  Cont'd Micro  Color: Yellow Bilirubin: Neg mg/dL WBC/hpf: 20 - 40/hpf  Appearance: Cloudy Ketones: Neg mg/dL RBC/hpf: 10 - 20/hpf  Specific Gravity: 1.025 Blood: 3+ ery/uL Bacteria: Rare (0-9/hpf)  pH: 5.5 Protein: 2+ mg/dL Cystals: NS (Not Seen)  Glucose: Neg mg/dL Urobilinogen: 0.2 mg/dL Casts: NS (Not Seen)    Nitrites: Neg Trichomonas: Not Present    Leukocyte Esterase: 3+ leu/uL Mucous: Not Present      Epithelial Cells: 10 - 20/hpf      Yeast: NS (Not  Seen)      Sperm: Not Present    ASSESSMENT:      ICD-10 Details  1 GU:   Renal calculus - N20.0 Chronic, Stable   PLAN:           Orders Labs CULTURE, URINE          Document Letter(s):  Created for Patient: Clinical Summary         Notes:   Urine was sent for precautionary culture. She has been cleared by her PCP for surgery. All questions answered to the best of my ability.   Keep upcoming surgery on 05/02/22.         Next Appointment:      Next Appointment: 05/02/2022 11:00 AM    Appointment Type: Surgery     Location: [facility] [phone]    Provider: Irine Seal, M.D.    Reason for Visit: OBS WL RT PCNL 1ST STAGE

## 2022-05-01 NOTE — Consult Note (Signed)
Chief Complaint: Patient was seen in consultation today for right percutaneous nephrostomy/nephroureteral catheter placement  Referring Physician(s): Keene Breath  Supervising Physician: Michaelle Birks  Patient Status: [facility] - Out-pt  History of Present Illness: Melissa Davenport is an 81 y.o. female with PMH sig for skin cancer, DM, glaucoma, Graves disease, HLD,HTN, LVH, Meniere's disease and renal stones. CT A/P on 01/28/22 revealed:  Right renal calculi, largest in renal pelvis measuring 1.8 cm.   Mild right renal pelvicaliectasis and perinephric stranding, which may be due to obstructive uropathy or pyelonephritis.  She presents today for right PCN/NU cath placement prior to PCNL.    Past Medical History:  Diagnosis Date   Allergic rhinitis    Allergy    allergic rhinitis   Basal cell carcinoma    Cancer ([place])    Cataract    Complication of anesthesia    pt had nerve block in right shoulder for surgery and pt stated she could not breathe after that and bl/ increased , surgery was at outpatient surgery facilty done by DR Tamera Punt   Diabetes mellitus without complication ([place])    Glaucoma    Graves disease    History of kidney stones    Hyperlipidemia    Hypertension    Hypothyroidism    LVH (left ventricular hypertrophy) 2017   Mild to moderate   Meniere's disease    Mixed dyslipidemia    Osteopenia    Thyroid disease    Vitamin D deficiency     Past Surgical History:  Procedure Laterality Date   ABDOMINAL HYSTERECTOMY     CARDIAC CATHETERIZATION N/A 01/31/2016   Procedure: Left Heart Cath and Coronary Angiography;  Surgeon: Burnell Blanks, MD;  Location: [place] CV LAB;  Service: Cardiovascular;  Laterality: N/A;   CATARACT EXTRACTION Bilateral    COLONOSCOPY  2008 & 2014   EYE SURGERY     FLEXIBLE SIGMOIDOSCOPY     left ankle surgery     left foot surgery     SPINE SURGERY      Allergies: Alphagan [brimonidine], Flagyl [metronidazole], Ketek  [telithromycin], and Levaquin [levofloxacin]  Medications: Prior to Admission medications   Medication Sig Start Date End Date Taking? Authorizing Provider  acetaminophen (TYLENOL) 325 MG tablet Take 650 mg by mouth every 6 (six) hours as needed for moderate pain.   Yes [provider]  aspirin 81 MG tablet Take 81 mg by mouth daily.   Yes [provider]  bimatoprost (LUMIGAN) 0.01 % SOLN Place 1 drop into both eyes at bedtime.  03/15/12  Yes [provider]  cetirizine (ZYRTEC) 10 MG tablet Take 10 mg by mouth daily.   Yes [provider]  Cholecalciferol 25 MCG (1000 UT) tablet Take 1,000 Units by mouth daily.   Yes [provider]  Dorzolamide HCl-Timolol Mal PF 2-0.5 % SOLN Place 1 drop into both eyes 2 (two) times daily. 02/20/22  Yes [provider]  fluticasone (FLONASE) 50 MCG/ACT nasal spray Place 1 spray into both nostrils daily as needed for allergies. Use only needed 06/07/10  Yes [provider]  levothyroxine (SYNTHROID, LEVOTHROID) 100 MCG tablet Take 100 mcg by mouth daily before breakfast.   Yes [provider]  losartan-hydrochlorothiazide (HYZAAR) 50-12.5 MG per tablet Take 1 tablet by mouth daily. 08/23/10  Yes [provider]  metFORMIN (GLUCOPHAGE-XR) 500 MG 24 hr tablet Take 500 mg by mouth daily after supper.   Yes [provider]  metoprolol succinate (TOPROL-XL) 50  MG 24 hr tablet Take 50 mg by mouth daily. Take with or immediately following a meal.   Yes [provider]  Multiple Vitamin (MULTI-VITAMINS) TABS Take 1 tablet by mouth daily.   Yes [provider]  niacin 500 MG tablet Take 500 mg by mouth daily.   Yes [provider]  Omega-3 1000 MG CAPS Take 2,000 mg by mouth daily.   Yes [provider]  pravastatin (PRAVACHOL) 20 MG tablet Take 20 mg by mouth daily.   Yes [provider]  promethazine-dextromethorphan (PROMETHAZINE-DM)  6.25-15 MG/5ML syrup Take 5 mLs by mouth every 6 (six) hours as needed for cough.   Yes [provider]  Vitamin D, Ergocalciferol, (DRISDOL) 1.25 MG (50000 UNIT) CAPS capsule Take 50,000 Units by mouth every Monday. 03/24/22  Yes [provider]  zinc gluconate 50 MG tablet Take 50 mg by mouth daily.   Yes [provider]  valsartan (DIOVAN) 160 MG tablet Take 1 tablet (160 mg total) by mouth daily. 09/03/20 02/16/21  Burchette, Alinda Sierras, MD     Family History  Problem Relation Age of Onset   Heart disease Mother 33   Heart disease Father 66   Emphysema Father    Diabetes Sister    CVA Sister    Hypothyroidism Daughter     Social History   Socioeconomic History   Marital status: Divorced    Spouse name: Not on file   Number of children: 2   Years of education: Not on file   Highest education level: Not on file  Occupational History   Not on file  Tobacco Use   Smoking status: Never   Smokeless tobacco: Never  Vaping Use   Vaping Use: Never used  Substance and Sexual Activity   Alcohol use: No   Drug use: No   Sexual activity: Not on file  Other Topics Concern   Not on file  Social History Narrative   Not on file   Social Determinants of Health   Financial Resource Strain: Not on file  Food Insecurity: Not on file  Transportation Needs: Not on file  Physical Activity: Not on file  Stress: Not on file  Social Connections: Not on file     Review of Systems  Vital Signs:     Physical Exam  Imaging: DG Sacrum/Coccyx  Result Date: 04/12/2022 CLINICAL DATA:  Fall with pain to the coccyx EXAM: SACRUM AND COCCYX - 2+ VIEW COMPARISON:  CT 01/28/2022 FINDINGS: SI joint degenerative change. Pubic symphysis and rami appear intact. No definitive fracture is seen. IMPRESSION: Negative. Electronically Signed   By: Donavan Foil M.D.   On: 04/12/2022 20:51    Labs:  CBC: Recent Labs    01/28/22 1610 01/29/22 0520 01/30/22 0421  04/19/22 1023  WBC 13.1* 10.6* 7.2 7.2  HGB 14.5 14.0 13.8 15.9*  HCT 41.5 41.0 40.4 47.0*  PLT 175 170 183 250    COAGS: No results for input(s): "INR", "APTT" in the last 8760 hours.  BMP: Recent Labs    01/28/22 1610 01/29/22 0520 01/30/22 0421 04/19/22 1023  NA 133* 140 143 138  K 3.6 2.8* 4.0 3.6  CL 96* 103 108 104  CO2 '28 28 27 27  '$ GLUCOSE 122* 104* 114* 170*  BUN '23 18 15 19  '$ CALCIUM 9.3 8.6* 8.8* 9.3  CREATININE 0.93 0.94 0.64 0.70  GFRNONAA >60 >60 >60 >60    LIVER FUNCTION TESTS: Recent Labs    01/28/22 1610  BILITOT 1.1  AST 45*  ALT 27  ALKPHOS 49  PROT 6.7  ALBUMIN 3.7    TUMOR MARKERS: No results for input(s): "AFPTM", "CEA", "CA199", "CHROMGRNA" in the last 8760 hours.  Assessment and Plan: 81 y.o. female with PMH sig for skin cancer, DM, glaucoma, Graves disease, HLD,HTN, LVH, Meniere's disease and renal stones. CT A/P on 01/28/22 revealed:  Right renal calculi, largest in renal pelvis measuring 1.8 cm.   Mild right renal pelvicaliectasis and perinephric stranding, which may be due to obstructive uropathy or pyelonephritis.  She presents today for right PCN/NU cath placement prior to PCNL.Risks and benefits of right PCN/nephroureteral catheter placement was discussed with the patient including, but not limited to, infection, bleeding, significant bleeding causing loss or decrease in renal function or damage to adjacent structures.   All of the patient's questions were answered, patient is agreeable to proceed.  Consent signed and in chart.      Thank you for this interesting consult.  I greatly enjoyed meeting Melissa Davenport and look forward to participating in their care.  A copy of this report was sent to the requesting provider on this date.  Electronically Signed: D. Rowe Robert, PA-C 05/01/2022, 1:39 PM   I spent a total of     in face to face in clinical consultation, greater than 50% of which was counseling/coordinating care  for right percutaneous nephrostomy/nephroureteral catheter placement

## 2022-05-01 NOTE — H&P (Signed)
GU PMH: Renal calculus - 02/08/2022      PMH Notes: Diabetes.  Kidney stones.   NON-GU PMH: Arthritis Glaucoma Hypertension Hyperthyroidism Hypothyroidism    FAMILY HISTORY: 2 daughters - Runs in Family [employer] - Father   SOCIAL HISTORY: Marital Status: Divorced Preferred Language: English; Ethnicity: Not Hispanic Or Latino; Race: White Current Smoking Status: Patient has never smoked.   Tobacco Use Assessment Completed: Used Tobacco in last 30 days? Has never drank.  Drinks 2 caffeinated drinks per day.    REVIEW OF SYSTEMS:    GU Review Female:   Patient reports frequent urination. Patient denies hard to postpone urination, burning /pain with urination, get up at night to urinate, leakage of urine, stream starts and stops, trouble starting your stream, have to strain to urinate, and being pregnant.  Gastrointestinal (Upper):   Patient denies vomiting, indigestion/ heartburn, and nausea.  Gastrointestinal (Lower):   Patient denies diarrhea and constipation.  Constitutional:   Patient reports fatigue. Patient denies fever, night sweats, and weight loss.  Skin:   Patient denies skin rash/ lesion and itching.  Eyes:   Patient denies blurred vision and double vision.  Ears/ Nose/ Throat:   Patient denies sore throat and sinus problems.  Hematologic/Lymphatic:   Patient denies swollen glands and easy bruising.  Cardiovascular:   Patient denies leg swelling and chest pains.  Respiratory:   Patient denies cough and shortness of breath.  Musculoskeletal:   Patient denies back pain and joint pain.  Neurological:   Patient denies headaches and dizziness.  Psychologic:   Patient denies depression and anxiety.   VITAL SIGNS:      04/17/2022 09:47 AM  BP 125/75 mmHg  Pulse 72 /min  Temperature 97.8 F / 36.5 C   MULTI-SYSTEM PHYSICAL EXAMINATION:    Constitutional: Well-nourished. No physical deformities. Normally developed. Good grooming.  Neck: Neck symmetrical, not swollen.  Normal tracheal position.  Respiratory: No labored breathing, no use of accessory muscles.   Cardiovascular: Regular rate and rhythm. No murmur, no gallop. Normal temperature, normal extremity pulses, no swelling, no varicosities.   Skin: No paleness, no jaundice, no cyanosis. No lesion, no ulcer, no rash.  Neurologic / Psychiatric: Oriented to time, oriented to place, oriented to person. No depression, no anxiety, no agitation.  Gastrointestinal: No mass, no tenderness, no rigidity, non obese abdomen.  Musculoskeletal: Normal gait and station of head and neck.     Complexity of Data:  Source Of History:  Patient  Records Review:   Previous Doctor Records, Previous Hospital Records, Previous Patient Records  Urine Test Review:   Urinalysis   04/17/22  Urinalysis  Urine Appearance Cloudy   Urine Color Yellow   Urine Glucose Neg mg/dL  Urine Bilirubin Neg mg/dL  Urine Ketones Neg mg/dL  Urine Specific Gravity 1.025   Urine Blood 3+ ery/uL  Urine pH 5.5   Urine Protein 2+ mg/dL  Urine Urobilinogen 0.2 mg/dL  Urine Nitrites Neg   Urine Leukocyte Esterase 3+ leu/uL  Urine WBC/hpf 20 - 40/hpf   Urine RBC/hpf 10 - 20/hpf   Urine Epithelial Cells 10 - 20/hpf   Urine Bacteria Rare (0-9/hpf)   Urine Mucous Not Present   Urine Yeast NS (Not Seen)   Urine Trichomonas Not Present   Urine Cystals NS (Not Seen)   Urine Casts NS (Not Seen)   Urine Sperm Not Present    PROCEDURES:          Urinalysis w/Scope Dipstick Dipstick  Cont'd Micro  Color: Yellow Bilirubin: Neg mg/dL WBC/hpf: 20 - 40/hpf  Appearance: Cloudy Ketones: Neg mg/dL RBC/hpf: 10 - 20/hpf  Specific Gravity: 1.025 Blood: 3+ ery/uL Bacteria: Rare (0-9/hpf)  pH: 5.5 Protein: 2+ mg/dL Cystals: NS (Not Seen)  Glucose: Neg mg/dL Urobilinogen: 0.2 mg/dL Casts: NS (Not Seen)    Nitrites: Neg Trichomonas: Not Present    Leukocyte Esterase: 3+ leu/uL Mucous: Not Present      Epithelial Cells: 10 - 20/hpf      Yeast: NS (Not  Seen)      Sperm: Not Present    ASSESSMENT:      ICD-10 Details  1 GU:   Renal calculus - N20.0 Chronic, Stable   PLAN:           Orders Labs CULTURE, URINE          Document Letter(s):  Created for Patient: Clinical Summary         Notes:   Urine was sent for precautionary culture. She has been cleared by her PCP for surgery. All questions answered to the best of my ability.   Keep upcoming surgery on 05/02/22.         Next Appointment:      Next Appointment: 05/02/2022 11:00 AM    Appointment Type: Surgery     Location: [facility] [phone]    Provider: Irine Seal, M.D.    Reason for Visit: OBS WL RT PCNL 1ST STAGE

## 2022-05-02 ENCOUNTER — Ambulatory Visit (HOSPITAL_COMMUNITY): Payer: PPO | Admitting: Physician Assistant

## 2022-05-02 ENCOUNTER — Observation Stay (HOSPITAL_COMMUNITY)
Admission: RE | Admit: 2022-05-02 | Discharge: 2022-05-03 | Disposition: A | Discharge status: Home / Self Care | Payer: PPO | Attending: Urology | Admitting: Urology

## 2022-05-02 ENCOUNTER — Encounter (HOSPITAL_COMMUNITY): Payer: Self-pay | Admitting: Urology

## 2022-05-02 ENCOUNTER — Ambulatory Visit (HOSPITAL_COMMUNITY)
Admission: RE | Admit: 2022-05-02 | Discharge: 2022-05-02 | Disposition: A | Discharge status: Home / Self Care | Payer: PPO | Source: Ambulatory Visit | Attending: Urology | Admitting: Urology

## 2022-05-02 ENCOUNTER — Ambulatory Visit (HOSPITAL_COMMUNITY): Payer: PPO

## 2022-05-02 ENCOUNTER — Ambulatory Visit (HOSPITAL_BASED_OUTPATIENT_CLINIC_OR_DEPARTMENT_OTHER): Payer: PPO | Admitting: Anesthesiology

## 2022-05-02 ENCOUNTER — Encounter (HOSPITAL_COMMUNITY)
Admission: RE | Disposition: A | Discharge status: Home / Self Care | Payer: Self-pay | Source: Home / Self Care | Attending: Urology

## 2022-05-02 DIAGNOSIS — N3030 Trigonitis without hematuria: Secondary | ICD-10-CM | POA: Diagnosis not present

## 2022-05-02 DIAGNOSIS — Z7982 Long term (current) use of aspirin: Secondary | ICD-10-CM | POA: Diagnosis not present

## 2022-05-02 DIAGNOSIS — I10 Essential (primary) hypertension: Secondary | ICD-10-CM | POA: Diagnosis not present

## 2022-05-02 DIAGNOSIS — Z85828 Personal history of other malignant neoplasm of skin: Secondary | ICD-10-CM | POA: Insufficient documentation

## 2022-05-02 DIAGNOSIS — N202 Calculus of kidney with calculus of ureter: Secondary | ICD-10-CM | POA: Diagnosis not present

## 2022-05-02 DIAGNOSIS — E119 Type 2 diabetes mellitus without complications: Secondary | ICD-10-CM | POA: Diagnosis not present

## 2022-05-02 DIAGNOSIS — N3080 Other cystitis without hematuria: Secondary | ICD-10-CM | POA: Insufficient documentation

## 2022-05-02 DIAGNOSIS — N200 Calculus of kidney: Secondary | ICD-10-CM

## 2022-05-02 DIAGNOSIS — N2886 Ureteritis cystica: Secondary | ICD-10-CM | POA: Diagnosis not present

## 2022-05-02 DIAGNOSIS — E039 Hypothyroidism, unspecified: Secondary | ICD-10-CM | POA: Diagnosis not present

## 2022-05-02 DIAGNOSIS — Z01818 Encounter for other preprocedural examination: Secondary | ICD-10-CM

## 2022-05-02 DIAGNOSIS — Z7984 Long term (current) use of oral hypoglycemic drugs: Secondary | ICD-10-CM | POA: Diagnosis not present

## 2022-05-02 DIAGNOSIS — Z79899 Other long term (current) drug therapy: Secondary | ICD-10-CM | POA: Diagnosis not present

## 2022-05-02 DIAGNOSIS — N3020 Other chronic cystitis without hematuria: Secondary | ICD-10-CM | POA: Diagnosis not present

## 2022-05-02 HISTORY — PX: IR URETERAL STENT RIGHT NEW ACCESS W/O SEP NEPHROSTOMY CATH: IMG6076

## 2022-05-02 HISTORY — PX: CYSTOSCOPY/URETEROSCOPY/HOLMIUM LASER/STENT PLACEMENT: SHX6546

## 2022-05-02 LAB — CBC WITH DIFFERENTIAL/PLATELET
Abs Immature Granulocytes: 0.02 10*3/uL (ref 0.00–0.07)
Basophils Absolute: 0.1 10*3/uL (ref 0.0–0.1)
Basophils Relative: 1 %
Eosinophils Absolute: 0.2 10*3/uL (ref 0.0–0.5)
Eosinophils Relative: 2 %
HCT: 45.1 % (ref 36.0–46.0)
Hemoglobin: 15.6 g/dL — ABNORMAL HIGH (ref 12.0–15.0)
Immature Granulocytes: 0 %
Lymphocytes Relative: 30 %
Lymphs Abs: 2.8 10*3/uL (ref 0.7–4.0)
MCH: 31.5 pg (ref 26.0–34.0)
MCHC: 34.6 g/dL (ref 30.0–36.0)
MCV: 91.1 fL (ref 80.0–100.0)
Monocytes Absolute: 0.8 10*3/uL (ref 0.1–1.0)
Monocytes Relative: 9 %
Neutro Abs: 5.5 10*3/uL (ref 1.7–7.7)
Neutrophils Relative %: 58 %
Platelets: 261 10*3/uL (ref 150–400)
RBC: 4.95 MIL/uL (ref 3.87–5.11)
RDW: 13 % (ref 11.5–15.5)
WBC: 9.5 10*3/uL (ref 4.0–10.5)
nRBC: 0 % (ref 0.0–0.2)

## 2022-05-02 LAB — BASIC METABOLIC PANEL
Anion gap: 12 (ref 5–15)
BUN: 27 mg/dL — ABNORMAL HIGH (ref 8–23)
CO2: 18 mmol/L — ABNORMAL LOW (ref 22–32)
Calcium: 9.1 mg/dL (ref 8.9–10.3)
Chloride: 105 mmol/L (ref 98–111)
Creatinine, Ser: 1.26 mg/dL — ABNORMAL HIGH (ref 0.44–1.00)
GFR, Estimated: 43 mL/min — ABNORMAL LOW (ref >60)
Glucose, Bld: 135 mg/dL — ABNORMAL HIGH (ref 70–99)
Potassium: 4.1 mmol/L (ref 3.5–5.1)
Sodium: 135 mmol/L (ref 135–145)

## 2022-05-02 LAB — ABO/RH: ABO/RH(D): A POS

## 2022-05-02 LAB — GLUCOSE, CAPILLARY
Glucose-Capillary: 150 mg/dL — ABNORMAL HIGH (ref 70–99)
Glucose-Capillary: 121 mg/dL — ABNORMAL HIGH (ref 70–99)

## 2022-05-02 LAB — PROTIME-INR
INR: 1 (ref 0.8–1.2)
Prothrombin Time: 13.5 seconds (ref 11.4–15.2)

## 2022-05-02 SURGERY — CYSTOSCOPY/URETEROSCOPY/HOLMIUM LASER/STENT PLACEMENT
Anesthesia: General | Laterality: Right

## 2022-05-02 MED ORDER — SODIUM CHLORIDE 0.9 % IR SOLN
Status: DC | PRN
  Administered 2022-05-02: 3000 mL via INTRAVESICAL

## 2022-05-02 MED ORDER — LATANOPROST 0.005 % OP SOLN
1.0000 [drp] | Freq: Every day | OPHTHALMIC | Status: DC
  Administered 2022-05-02: 1 [drp] via OPHTHALMIC
  Filled 2022-05-02: qty 2.5

## 2022-05-02 MED ORDER — EPHEDRINE SULFATE-NACL 50-0.9 MG/10ML-% IV SOSY
PREFILLED_SYRINGE | INTRAVENOUS | Status: DC | PRN
  Administered 2022-05-02 (×2): 10 mg via INTRAVENOUS

## 2022-05-02 MED ORDER — DORZOLAMIDE HCL-TIMOLOL MAL PF 2-0.5 % OP SOLN: 1.0000 [drp] | Freq: Two times a day (BID) | OPHTHALMIC | Status: DC

## 2022-05-02 MED ORDER — METOPROLOL SUCCINATE ER 50 MG PO TB24
50.0000 mg | ORAL_TABLET | Freq: Every day | ORAL | Status: DC
  Administered 2022-05-03: 50 mg via ORAL
  Filled 2022-05-02: qty 1

## 2022-05-02 MED ORDER — DORZOLAMIDE HCL-TIMOLOL MAL 2-0.5 % OP SOLN
1.0000 [drp] | Freq: Two times a day (BID) | OPHTHALMIC | Status: DC
  Administered 2022-05-02: 1 [drp] via OPHTHALMIC
  Filled 2022-05-02: qty 10

## 2022-05-02 MED ORDER — LACTATED RINGERS IV SOLN: INTRAVENOUS | Status: DC

## 2022-05-02 MED ORDER — LIDOCAINE 2% (20 MG/ML) 5 ML SYRINGE
INTRAMUSCULAR | Status: DC | PRN
  Administered 2022-05-02: 60 mg via INTRAVENOUS

## 2022-05-02 MED ORDER — ZOLPIDEM TARTRATE 5 MG PO TABS: 5.0000 mg | ORAL_TABLET | Freq: Every evening | ORAL | Status: DC | PRN

## 2022-05-02 MED ORDER — SODIUM CHLORIDE 0.9 % IV SOLN: 2.0000 g | Freq: Once | INTRAVENOUS | Status: DC

## 2022-05-02 MED ORDER — SODIUM CHLORIDE 0.9 % IV SOLN
INTRAVENOUS | Status: AC
  Administered 2022-05-02: 2 g
  Filled 2022-05-02: qty 20

## 2022-05-02 MED ORDER — LEVOTHYROXINE SODIUM 100 MCG PO TABS
100.0000 ug | ORAL_TABLET | Freq: Every day | ORAL | Status: DC
  Administered 2022-05-03: 100 ug via ORAL
  Filled 2022-05-02: qty 1

## 2022-05-02 MED ORDER — LIDOCAINE HCL 1 % IJ SOLN
INTRAMUSCULAR | Status: AC
  Filled 2022-05-02: qty 20

## 2022-05-02 MED ORDER — SENNOSIDES-DOCUSATE SODIUM 8.6-50 MG PO TABS: 1.0000 | ORAL_TABLET | Freq: Every evening | ORAL | Status: DC | PRN

## 2022-05-02 MED ORDER — DEXAMETHASONE SODIUM PHOSPHATE 10 MG/ML IJ SOLN
INTRAMUSCULAR | Status: DC | PRN
  Administered 2022-05-02: 10 mg via INTRAVENOUS

## 2022-05-02 MED ORDER — ACETAMINOPHEN 325 MG PO TABS: 650.0000 mg | ORAL_TABLET | ORAL | Status: DC | PRN

## 2022-05-02 MED ORDER — IOHEXOL 300 MG/ML  SOLN
INTRAMUSCULAR | Status: AC | PRN
  Administered 2022-05-02: 50 mL via INTRAVENOUS

## 2022-05-02 MED ORDER — PROPOFOL 10 MG/ML IV BOLUS
INTRAVENOUS | Status: AC
  Filled 2022-05-02: qty 20

## 2022-05-02 MED ORDER — PRAVASTATIN SODIUM 20 MG PO TABS
20.0000 mg | ORAL_TABLET | Freq: Every day | ORAL | Status: DC
  Administered 2022-05-02: 20 mg via ORAL
  Filled 2022-05-02: qty 1

## 2022-05-02 MED ORDER — FENTANYL CITRATE (PF) 250 MCG/5ML IJ SOLN
INTRAMUSCULAR | Status: AC
  Filled 2022-05-02: qty 5

## 2022-05-02 MED ORDER — FENTANYL CITRATE PF 50 MCG/ML IJ SOSY: 25.0000 ug | PREFILLED_SYRINGE | INTRAMUSCULAR | Status: DC | PRN

## 2022-05-02 MED ORDER — OXYCODONE HCL 5 MG PO TABS: 5.0000 mg | ORAL_TABLET | ORAL | Status: DC | PRN

## 2022-05-02 MED ORDER — FLEET ENEMA 7-19 GM/118ML RE ENEM: 1.0000 | ENEMA | Freq: Once | RECTAL | Status: DC | PRN

## 2022-05-02 MED ORDER — IOHEXOL 300 MG/ML  SOLN
50.0000 mL | Freq: Once | INTRAMUSCULAR | Status: AC | PRN
  Administered 2022-05-02: 25 mL

## 2022-05-02 MED ORDER — HYDROCHLOROTHIAZIDE 12.5 MG PO TABS
12.5000 mg | ORAL_TABLET | Freq: Every day | ORAL | Status: DC
  Administered 2022-05-02 – 2022-05-03 (×2): 12.5 mg via ORAL
  Filled 2022-05-02 (×2): qty 1

## 2022-05-02 MED ORDER — SODIUM CHLORIDE 0.9 % IV SOLN: 2.0000 g | INTRAVENOUS | Status: DC

## 2022-05-02 MED ORDER — MIDAZOLAM HCL 2 MG/2ML IJ SOLN
INTRAMUSCULAR | Status: AC
  Filled 2022-05-02: qty 4

## 2022-05-02 MED ORDER — SULFAMETHOXAZOLE-TRIMETHOPRIM 800-160 MG PO TABS
1.0000 | ORAL_TABLET | Freq: Two times a day (BID) | ORAL | Status: DC
  Administered 2022-05-02 – 2022-05-03 (×2): 1 via ORAL
  Filled 2022-05-02 (×2): qty 1

## 2022-05-02 MED ORDER — ORAL CARE MOUTH RINSE: 15.0000 mL | Freq: Once | OROMUCOSAL | Status: AC

## 2022-05-02 MED ORDER — FENTANYL CITRATE (PF) 100 MCG/2ML IJ SOLN
INTRAMUSCULAR | Status: AC
  Filled 2022-05-02: qty 2

## 2022-05-02 MED ORDER — LORATADINE 10 MG PO TABS
10.0000 mg | ORAL_TABLET | Freq: Every day | ORAL | Status: DC
  Administered 2022-05-02 – 2022-05-03 (×2): 10 mg via ORAL
  Filled 2022-05-02 (×2): qty 1

## 2022-05-02 MED ORDER — 0.9 % SODIUM CHLORIDE (POUR BTL) OPTIME
TOPICAL | Status: DC | PRN
  Administered 2022-05-02: 1000 mL

## 2022-05-02 MED ORDER — FENTANYL CITRATE (PF) 100 MCG/2ML IJ SOLN
INTRAMUSCULAR | Status: AC | PRN
  Administered 2022-05-02 (×2): 50 ug via INTRAVENOUS

## 2022-05-02 MED ORDER — FENTANYL CITRATE (PF) 100 MCG/2ML IJ SOLN
INTRAMUSCULAR | Status: DC | PRN
  Administered 2022-05-02: 50 ug via INTRAVENOUS

## 2022-05-02 MED ORDER — BISACODYL 10 MG RE SUPP: 10.0000 mg | Freq: Every day | RECTAL | Status: DC | PRN

## 2022-05-02 MED ORDER — MIDAZOLAM HCL 2 MG/2ML IJ SOLN
INTRAMUSCULAR | Status: AC | PRN
  Administered 2022-05-02 (×2): 1 mg via INTRAVENOUS

## 2022-05-02 MED ORDER — CHLORHEXIDINE GLUCONATE 0.12 % MT SOLN
15.0000 mL | Freq: Once | OROMUCOSAL | Status: AC
  Administered 2022-05-02: 15 mL via OROMUCOSAL

## 2022-05-02 MED ORDER — ONDANSETRON HCL 4 MG/2ML IJ SOLN: 4.0000 mg | INTRAMUSCULAR | Status: DC | PRN

## 2022-05-02 MED ORDER — ONDANSETRON HCL 4 MG/2ML IJ SOLN
INTRAMUSCULAR | Status: DC | PRN
  Administered 2022-05-02: 4 mg via INTRAVENOUS

## 2022-05-02 MED ORDER — IOHEXOL 300 MG/ML  SOLN
INTRAMUSCULAR | Status: DC | PRN
  Administered 2022-05-02: 6 mL

## 2022-05-02 MED ORDER — LOSARTAN POTASSIUM 50 MG PO TABS
50.0000 mg | ORAL_TABLET | Freq: Every day | ORAL | Status: DC
  Administered 2022-05-02 – 2022-05-03 (×2): 50 mg via ORAL
  Filled 2022-05-02 (×2): qty 1

## 2022-05-02 MED ORDER — FLUTICASONE PROPIONATE 50 MCG/ACT NA SUSP: 1.0000 | Freq: Every day | NASAL | Status: DC | PRN

## 2022-05-02 MED ORDER — LOSARTAN POTASSIUM-HCTZ 50-12.5 MG PO TABS: 1.0000 | ORAL_TABLET | Freq: Every day | ORAL | Status: DC

## 2022-05-02 MED ORDER — POTASSIUM CHLORIDE IN NACL 20-0.45 MEQ/L-% IV SOLN
INTRAVENOUS | Status: DC
  Filled 2022-05-02 (×2): qty 1000

## 2022-05-02 MED ORDER — HYDROMORPHONE HCL 1 MG/ML IJ SOLN: 0.5000 mg | INTRAMUSCULAR | Status: DC | PRN

## 2022-05-02 MED ORDER — IOHEXOL 300 MG/ML  SOLN: 50.0000 mL | Freq: Once | INTRAMUSCULAR | Status: DC | PRN

## 2022-05-02 MED ORDER — METFORMIN HCL ER 500 MG PO TB24: 500.0000 mg | ORAL_TABLET | Freq: Every day | ORAL | Status: DC

## 2022-05-02 MED ORDER — PROPOFOL 10 MG/ML IV BOLUS
INTRAVENOUS | Status: DC | PRN
  Administered 2022-05-02: 130 mg via INTRAVENOUS

## 2022-05-02 SURGICAL SUPPLY — 57 items
APL PRP STRL LF DISP 70% ISPRP (MISCELLANEOUS)
APL SKNCLS STERI-STRIP NONHPOA (GAUZE/BANDAGES/DRESSINGS)
BAG COUNTER SPONGE SURGICOUNT (BAG) IMPLANT
BAG DRN RND TRDRP ANRFLXCHMBR (UROLOGICAL SUPPLIES) ×1
BAG SPNG CNTER NS LX DISP (BAG)
BAG URINE DRAIN 2000ML AR STRL (UROLOGICAL SUPPLIES) ×1 IMPLANT
BASKET ZERO TIP NITINOL 2.4FR (BASKET) IMPLANT
BENZOIN TINCTURE PRP APPL 2/3 (GAUZE/BANDAGES/DRESSINGS) ×1 IMPLANT
BLADE SURG 15 STRL LF DISP TIS (BLADE) ×1 IMPLANT
BLADE SURG 15 STRL SS (BLADE)
BSKT STON RTRVL ZERO TP 2.4FR (BASKET)
CATH 2WAY 30CC 24FR (CATHETERS) IMPLANT
CATH ROBINSON RED A/P 20FR (CATHETERS) IMPLANT
CATH URETERAL DUAL LUMEN 10F (MISCELLANEOUS) ×1 IMPLANT
CATH URETL OPEN 5X70 (CATHETERS) IMPLANT
CATH UROLOGY TORQUE 40 (MISCELLANEOUS) ×1 IMPLANT
CATH X-FORCE N30 NEPHROSTOMY (TUBING) ×1 IMPLANT
CHLORAPREP W/TINT 26 (MISCELLANEOUS) ×1 IMPLANT
COVER BACK TABLE 60X90IN (DRAPES) ×1 IMPLANT
COVER SURGICAL LIGHT HANDLE (MISCELLANEOUS) IMPLANT
DRAPE C-ARM 42X120 X-RAY (DRAPES) ×1 IMPLANT
DRAPE LINGEMAN PERC (DRAPES) ×1 IMPLANT
DRAPE SURG IRRIG POUCH 19X23 (DRAPES) ×1 IMPLANT
DRSG TEGADERM 8X12 (GAUZE/BANDAGES/DRESSINGS) ×2 IMPLANT
EXTRACTOR STONE NITINOL NGAGE (UROLOGICAL SUPPLIES) IMPLANT
GAUZE PAD ABD 8X10 STRL (GAUZE/BANDAGES/DRESSINGS) ×2 IMPLANT
GAUZE SPONGE 4X4 12PLY STRL (GAUZE/BANDAGES/DRESSINGS) IMPLANT
GLOVE SURG SS PI 8.0 STRL IVOR (GLOVE) ×1 IMPLANT
GOWN STRL REUS W/ TWL XL LVL3 (GOWN DISPOSABLE) ×1 IMPLANT
GOWN STRL REUS W/TWL XL LVL3 (GOWN DISPOSABLE) ×1
GUIDEWIRE AMPLAZ .035X145 (WIRE) ×2 IMPLANT
GUIDEWIRE STR DUAL SENSOR (WIRE) IMPLANT
KIT BASIN OR (CUSTOM PROCEDURE TRAY) ×1 IMPLANT
KIT PROBE 340X3.4XDISP GRN (MISCELLANEOUS) IMPLANT
KIT PROBE TRILOGY 3.4X340 (MISCELLANEOUS)
KIT PROBE TRILOGY 3.9X350 (MISCELLANEOUS) IMPLANT
KIT TURNOVER KIT A (KITS) IMPLANT
LASER FIB FLEXIVA PULSE ID 365 (Laser) IMPLANT
LUBRICANT JELLY K Y 4OZ (MISCELLANEOUS) ×1 IMPLANT
MANIFOLD NEPTUNE II (INSTRUMENTS) ×1 IMPLANT
NS IRRIG 1000ML POUR BTL (IV SOLUTION) ×1 IMPLANT
PACK CYSTO (CUSTOM PROCEDURE TRAY) IMPLANT
SHEATH NAVIGATOR HD 11/13X36 (SHEATH) IMPLANT
SHEATH PEELAWAY SET 9 (SHEATH) IMPLANT
SPONGE T-LAP 4X18 ~~LOC~~+RFID (SPONGE) ×1 IMPLANT
STENT URET 6FRX24 CONTOUR (STENTS) IMPLANT
SUT SILK 2 0 30  PSL (SUTURE)
SUT SILK 2 0 30 PSL (SUTURE) ×1 IMPLANT
SYR 10ML LL (SYRINGE) ×1 IMPLANT
SYR 20ML LL LF (SYRINGE) ×2 IMPLANT
TOWEL OR 17X26 10 PK STRL BLUE (TOWEL DISPOSABLE) ×1 IMPLANT
TRACTIP FLEXIVA PULS ID 200XHI (Laser) IMPLANT
TRACTIP FLEXIVA PULSE ID 200 (Laser) ×1
TRAY FOLEY MTR SLVR 16FR STAT (SET/KITS/TRAYS/PACK) ×1 IMPLANT
TUBING CONNECTING 10 (TUBING) ×1 IMPLANT
TUBING STONE CATCHER TRILOGY (MISCELLANEOUS) IMPLANT
TUBING UROLOGY SET (TUBING) ×1 IMPLANT

## 2022-05-02 NOTE — Op Note (Signed)
Procedure: 1.  Cystoscopy with right retrograde pyelogram and interpretation. 2.  Right ureteroscopy with holmium laser application and stent insertion. 3.  Application of fluoroscopy.  Preop diagnosis: Right partial staghorn stone.  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  Surgeon: Dr. Irine Seal.  Anesthesia: General.  Specimen: None.  Drains: 6 French by 24 cm right contour double-J stent.  EBL: None.  Complications: None.  Indications: The patient is an 81 year old female with a history of recurrent urinary tract infections who was found to have a 1.8 cm nonobstructing right renal pelvic stone with a branched calculus of the upper pole.  She had been treated with Bactrim for a week prior to today and was given Rocephin this morning when she was taken to interventional radiology for an attempt at percutaneous access which was unsuccessful.  After the unsuccessful attempt I felt that it would be best to perform ureteroscopy with an attempt to fragment the stone to allow better opportunities for percutaneous access.  Procedure: She was taken operating room where general anesthetic was induced.  She was placed in lithotomy position and fitted with PAS hose.  Her perineum and genitalia were prepped with Betadine solution she was draped in usual sterile fashion.  Cystoscopy was performed using the 21 [place] scope and 30 degree lens.  Examination revealed a normal urethra.  The bladder had findings consistent with extensive chronic follicular cystitis.  Ureteral orifices were unremarkable.  A right retrograde pyelogram was performed with a 5 [place] open-ended catheter and Omnipaque.  The right retrograde pyelogram demonstrated a normal distal ureter but in the mid to proximal ureter there wer multiple filling defects consistent with ureteritis cystica.  There is a filling defect in the renal pelvis consistent with a stone and a filling defect in the  upper pole consistent with the known stone.  There was no significant hydronephrosis.  A sensor wire was then advanced through the open-ended catheter to the kidney under fluoroscopic guidance and a 36 cm 11 x 13 French digital access sheath inner core was easily advanced the kidney over the wire.  I then removed the access sheath inner core and placed a dual-lumen catheter and placed a second safety wire to the kidney.  The dual-lumen catheter was removed and the assembled digital access sheath was then inserted the kidney over the working wire and the inner core and wire were then removed.  The dual-lumen digital flexible ureteroscope was then passed to the kidney and a tract tip 200 m laser fiber was then passed through the scope to the kidney and the Moses laser was set on the dusting setting with 0.3 j on the left pedal and 0.8 J on the right pedal.  The stone was visualized in the renal pelvis and had a pale appearance consistent with struvite.  The stone was then engaged with the laser and fragmented readily into small fragments.  Once the renal pelvic stone had been reduced to dust and grit, I fragmented the upper pole stone similarly and was able to reduce it to sand and grit with no fragments that remained that were sufficiently large to extract.  Final inspection demonstrated no significant residual large fragments and fluoroscopy suggested that some of the fragments may have moved to the lower pole but I was unable to readily access that part of the kidney at this time.  The ureteroscope was then removed followed by the access sheath.  The cystoscope was reinserted over the wire and  a 6 [place] by 24 cm contour double-J stent with tether was then passed the kidney under fluoroscopic guidance.  The wire was removed, leaving good coil in the kidney and a good coil in the bladder.  Her bladder was drained and the cystoscope was removed.  She was taken down from lithotomy position, her anesthetic was  reversed and she was moved recovery in stable condition.  There were no complications.

## 2022-05-02 NOTE — Interval H&P Note (Signed)
History and Physical Interval Note: IR couldn't get access so I will need to do ureteroscopy and laser to make room in the renal pelvis.  I have reviewed the risks with the patient.   05/02/2022 12:54 PM  Melissa Davenport  has presented today for surgery, with the diagnosis of RIGHT STAGHORN KIDNEY STONE.  The various methods of treatment have been discussed with the patient and family. After consideration of risks, benefits and other options for treatment, the patient has consented to  Procedure(s): CYSTOSCOPY/URETEROSCOPY/HOLMIUM LASER/STENT PLACEMENT (Right) as a surgical intervention.  The patient's history has been reviewed, patient examined, no change in status, stable for surgery.  I have reviewed the patient's chart and labs.  Questions were answered to the patient's satisfaction.     Melissa Davenport

## 2022-05-02 NOTE — Anesthesia Procedure Notes (Signed)
Procedure Name: LMA Insertion Date/Time: 05/02/2022 1:30 PM  Performed by: Gean Maidens, CRNAPre-anesthesia Checklist: Patient identified, Emergency Drugs available, Suction available, Patient being monitored and Timeout performed Patient Re-evaluated:Patient Re-evaluated prior to induction Oxygen Delivery Method: Circle system utilized Preoxygenation: Pre-oxygenation with 100% oxygen Induction Type: IV induction Ventilation: Mask ventilation without difficulty LMA: LMA inserted LMA Size: 4.0 Number of attempts: 1 Placement Confirmation: positive ETCO2 and breath sounds checked- equal and bilateral Tube secured with: Tape Dental Injury: Teeth and Oropharynx as per pre-operative assessment

## 2022-05-02 NOTE — Anesthesia Preprocedure Evaluation (Signed)
Anesthesia Evaluation  Patient identified by MRN, date of birth, ID band Patient awake    Reviewed: Allergy & Precautions, H&P , NPO status , Patient's Chart, lab work & pertinent test results, reviewed documented beta blocker date and time   Airway Mallampati: III  TM Distance: >3 FB Neck ROM: Full    Dental no notable dental hx. (+) Teeth Intact, Dental Advisory Given   Pulmonary neg pulmonary ROS   Pulmonary exam normal breath sounds clear to auscultation       Cardiovascular hypertension, Pt. on medications and Pt. on home beta blockers  Rhythm:Regular Rate:Normal     Neuro/Psych negative neurological ROS  negative psych ROS   GI/Hepatic negative GI ROS, Neg liver ROS,,,  Endo/Other  diabetes, Type 2, Oral Hypoglycemic AgentsHypothyroidism    Renal/GU negative Renal ROS  negative genitourinary   Musculoskeletal  (+) Arthritis , Osteoarthritis,    Abdominal   Peds  Hematology negative hematology ROS (+)   Anesthesia Other Findings   Reproductive/Obstetrics negative OB ROS                             Anesthesia Physical Anesthesia Plan  ASA: 2  Anesthesia Plan: General   Post-op Pain Management: Tylenol PO (pre-op)*   Induction: Intravenous  PONV Risk Score and Plan: 4 or greater and Ondansetron, Dexamethasone and Treatment may vary due to age or medical condition  Airway Management Planned: Oral ETT  Additional Equipment:   Intra-op Plan:   Post-operative Plan: Extubation in OR  Informed Consent: I have reviewed the patients History and Physical, chart, labs and discussed the procedure including the risks, benefits and alternatives for the proposed anesthesia with the patient or authorized representative who has indicated his/her understanding and acceptance.     Dental advisory given  Plan Discussed with: CRNA  Anesthesia Plan Comments:        Anesthesia Quick  Evaluation

## 2022-05-02 NOTE — Anesthesia Postprocedure Evaluation (Signed)
Anesthesia Post Note  Patient: Melissa Davenport  Procedure(s) Performed: CYSTOSCOPY/URETEROSCOPY/HOLMIUM LASER/STENT PLACEMENT (Right)     Patient location during evaluation: PACU Anesthesia Type: General Level of consciousness: awake and alert Pain management: pain level controlled Vital Signs Assessment: post-procedure vital signs reviewed and stable Respiratory status: spontaneous breathing, nonlabored ventilation, respiratory function stable and patient connected to nasal cannula oxygen Cardiovascular status: blood pressure returned to baseline and stable Postop Assessment: no apparent nausea or vomiting Anesthetic complications: no  No notable events documented.  Last Vitals:  Vitals:   05/02/22 1515 05/02/22 1530  BP: 123/60 (!) 119/53  Pulse: 67 63  Resp: 13 13  Temp:  (!) 36.4 C  SpO2: 97% 97%    Last Pain:  Vitals:   05/02/22 1530  TempSrc:   PainSc: 0-No pain                 ,W. EDMOND

## 2022-05-02 NOTE — Transfer of Care (Signed)
Immediate Anesthesia Transfer of Care Note  Patient: Melissa Davenport  Procedure(s) Performed: CYSTOSCOPY/URETEROSCOPY/HOLMIUM LASER/STENT PLACEMENT (Right)  Patient Location: PACU  Anesthesia Type:General  Level of Consciousness: sedated, patient cooperative, and responds to stimulation  Airway & Oxygen Therapy: Patient Spontanous Breathing and Patient connected to face mask oxygen  Post-op Assessment: Report given to RN and Post -op Vital signs reviewed and stable  Post vital signs: Reviewed and stable  Last Vitals:  Vitals Value Taken Time  BP 132/61 05/02/22 1431  Temp    Pulse 73 05/02/22 1432  Resp 20 05/02/22 1432  SpO2 100 % 05/02/22 1432  Vitals shown include unvalidated device data.  Last Pain:  Vitals:   05/02/22 1300  TempSrc:   PainSc: 0-No pain         Complications: No notable events documented.

## 2022-05-02 NOTE — Procedures (Signed)
Vascular and Interventional Radiology Procedure Note  Patient: Melissa Davenport DOB: January 21, 1941 Medical Record Number: [id number] Note Date/Time: 05/02/22 12:41 PM   Performing Physician: Michaelle Birks, MD Assistant(s): None  Diagnosis:  Renal calculus. Planned OR for PCNL  Procedure:  *ABORTED* RIGHT NEPHROURETERAL ACCESS  Anesthesia: Conscious Sedation Complications: None Estimated Blood Loss: Minimal Specimens:  None  Findings:  Non dilated RIGHT renal collecting system. Aborted / unsuccessful right-sided nephroureteral access.  Findings conveyed to Pt's Urologist, Dr. Irine Seal.  See detailed procedure note with images in PACS. The patient tolerated the procedure well without incident or complication and was returned to Recovery in stable condition.    Michaelle Birks, MD [facility] Inter[facility]iology [facility]   Pager. [place]

## 2022-05-03 ENCOUNTER — Other Ambulatory Visit: Payer: Self-pay | Admitting: Urology

## 2022-05-03 ENCOUNTER — Observation Stay (HOSPITAL_COMMUNITY): Payer: PPO

## 2022-05-03 ENCOUNTER — Encounter (HOSPITAL_COMMUNITY): Payer: Self-pay | Admitting: Urology

## 2022-05-03 DIAGNOSIS — N200 Calculus of kidney: Secondary | ICD-10-CM | POA: Diagnosis not present

## 2022-05-03 DIAGNOSIS — K5900 Constipation, unspecified: Secondary | ICD-10-CM | POA: Diagnosis not present

## 2022-05-03 MED ORDER — HYDROCODONE-ACETAMINOPHEN 5-325 MG PO TABS: 1.0000 | ORAL_TABLET | Freq: Four times a day (QID) | ORAL | 0 refills | Status: AC | PRN

## 2022-05-03 MED ORDER — SULFAMETHOXAZOLE-TRIMETHOPRIM 800-160 MG PO TABS: 1.0000 | ORAL_TABLET | Freq: Every day | ORAL | 2 refills | Status: AC

## 2022-05-03 NOTE — TOC Initial Note (Signed)
Transition of Care [facility]) - Initial/Assessment Note    Patient Details  Name: Melissa Davenport MRN: [id number] Date of Birth: 24-Aug-1940  Transition of Care [facility]) CM/SW Contact:    Henrietta Dine, RN Phone Number: 05/03/2022, 8:53 AM  Clinical Narrative:                  Transition of Care [facility]) Screening Note   Patient Details  Name: Melissa Davenport Date of Birth: 07-26-1940   Transition of Care [facility]) CM/SW Contact:    Henrietta Dine, RN Phone Number: 05/03/2022, 8:53 AM    Transition of Care Department [facility]) has reviewed patient and no TOC needs have been identified at this time. We will continue to monitor patient advancement through interdisciplinary progression rounds. If new patient transition needs arise, please place a TOC consult.          Patient Goals and CMS Choice        Expected Discharge Plan and Services           Expected Discharge Date: 05/03/22                                    Prior Living Arrangements/Services                       Activities of Daily Living Home Assistive Devices/Equipment: None ADL Screening (condition at time of admission) Patient's cognitive ability adequate to safely complete daily activities?: Yes Is the patient deaf or have difficulty hearing?: No Does the patient have difficulty seeing, even when wearing glasses/contacts?: No Does the patient have difficulty concentrating, remembering, or making decisions?: No Patient able to express need for assistance with ADLs?: Yes Does the patient have difficulty dressing or bathing?: No Independently performs ADLs?: Yes (appropriate for developmental age) Does the patient have difficulty walking or climbing stairs?: No Weakness of Legs: None Weakness of Arms/Hands: None  Permission Sought/Granted                  Emotional Assessment              Admission diagnosis:  Renal stones [N20.0] Patient Active Problem List    Diagnosis Date Noted   Renal stones 05/02/2022   Pyelonephritis 01/28/2022   Allergic rhinitis 09/22/2021   Body mass index (BMI) 34.0-34.9, adult 09/22/2021   Cardiomegaly 09/22/2021   Chronic sinusitis 09/22/2021   Decreased estrogen level 09/22/2021   Microalbuminuria 09/22/2021   Mixed hyperlipidemia 09/22/2021   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   Senile osteopenia 09/22/2021   Type 2 diabetes mellitus with other specified complication ([place]) 11/29/9483   Vitamin D deficiency 09/22/2021   Abnormal EKG    Precordial pain    Brow ptosis 12/24/2013   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 12/24/2013   Eyelid retraction 04/17/2013   Bulging eyes 04/17/2013   Disease of thyroid gland 10/09/2012   Glaucoma suspect 04/17/2012   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   Pseudoaphakia 04/17/2012   Glaucoma suspect of both eyes 04/17/2012   Blepharitis 03/28/2012   Meibomian gland disease 03/28/2012   Binocular vision disorder with diplopia 12/20/2011   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

## 2022-05-03 NOTE — Discharge Summary (Signed)
Physician Discharge Summary  Patient ID: Melissa Davenport MRN: [id number] DOB/AGE: [birth year] 80 y.o.  Admit date: 05/02/2022 Discharge date: 05/03/2022  Admission Diagnoses:  Renal stones  Discharge Diagnoses:  Principal Problem:   Renal stones   Past Medical History:  Diagnosis Date   Allergic rhinitis    Allergy    allergic rhinitis   Basal cell carcinoma    Cancer ([place])    Cataract    Complication of anesthesia    pt had nerve block in right shoulder for surgery and pt stated she could not breathe after that and bl/ increased , surgery was at outpatient surgery facilty done by DR Tamera Punt   Diabetes mellitus without complication ([place])    Glaucoma    Graves disease    History of kidney stones    Hyperlipidemia    Hypertension    Hypothyroidism    LVH (left ventricular hypertrophy) 2017   Mild to moderate   Meniere's disease    Mixed dyslipidemia    Osteopenia    Thyroid disease    Vitamin D deficiency     Surgeries: Procedure(s): CYSTOSCOPY/URETEROSCOPY/HOLMIUM LASER/STENT PLACEMENT on 05/02/2022   Consultants (if any):   Discharged Condition: Improved  Hospital Course: Melissa Davenport is an 81 y.o. female who was admitted 05/02/2022 with a diagnosis of Renal stones and went to the operating room on 05/02/2022 and underwent the above named procedures.  She is doing well post op.  She was given perioperative antibiotics:  Anti-infectives (From admission, onward)    Start     Dose/Rate Route Frequency Ordered Stop   05/03/22 0000  sulfamethoxazole-trimethoprim (BACTRIM DS) 800-160 MG tablet        1 tablet Oral Daily at bedtime 05/03/22 0817     05/02/22 2000  sulfamethoxazole-trimethoprim (BACTRIM DS) 800-160 MG per tablet 1 tablet        1 tablet Oral Every 12 hours 05/02/22 1436 05/09/22 2159   05/02/22 0830  cefTRIAXone (ROCEPHIN) 2 g in sodium chloride 0.9 % 100 mL IVPB  Status:  Discontinued        2 g 200 mL/hr over 30 Minutes Intravenous  Once  05/02/22 0749 05/02/22 0752   05/02/22 0749  cefTRIAXone (ROCEPHIN) 2 g in sodium chloride 0.9 % 100 mL IVPB  Status:  Discontinued        2 g 200 mL/hr over 30 Minutes Intravenous 30 min pre-op 05/02/22 0749 05/02/22 1836     .  She was given sequential compression devices for DVT prophylaxis.  She benefited maximally from the hospital stay and there were no complications.    Recent vital signs:  Vitals:   05/03/22 0246 05/03/22 0622  BP: (!) 121/51 (!) 103/43  Pulse: 63 69  Resp: 20 18  Temp: 97.7 F (36.5 C) 97.9 F (36.6 C)  SpO2: 93% 92%    Recent laboratory studies:  Lab Results  Component Value Date   HGB 15.6 (H) 05/02/2022   HGB 15.9 (H) 04/19/2022   HGB 13.8 01/30/2022   Lab Results  Component Value Date   WBC 9.5 05/02/2022   PLT 261 05/02/2022   Lab Results  Component Value Date   INR 1.0 05/02/2022   Lab Results  Component Value Date   NA 135 05/02/2022   K 4.1 05/02/2022   CL 105 05/02/2022   CO2 18 (L) 05/02/2022   BUN 27 (H) 05/02/2022   CREATININE 1.26 (H) 05/02/2022   GLUCOSE 135 (H) 05/02/2022    Discharge  Medications:   Allergies as of 05/03/2022       Reactions   Alphagan [brimonidine] Swelling, Other (See Comments)   Redness   Flagyl [metronidazole] Other (See Comments)   Causes dizziness   Ketek [telithromycin] Other (See Comments)   Causes anxiety and GI upset   Levaquin [levofloxacin] Nausea Only        Medication List     TAKE these medications    acetaminophen 325 MG tablet Commonly known as: TYLENOL Take 650 mg by mouth every 6 (six) hours as needed for moderate pain.   aspirin 81 MG tablet Take 81 mg by mouth daily.   bimatoprost 0.01 % Soln Commonly known as: LUMIGAN Place 1 drop into both eyes at bedtime.   cetirizine 10 MG tablet Commonly known as: ZYRTEC Take 10 mg by mouth daily.   Cholecalciferol 25 MCG (1000 UT) tablet Take 1,000 Units by mouth daily.   Dorzolamide HCl-Timolol Mal PF 2-0.5 %  Soln Place 1 drop into both eyes 2 (two) times daily.   fluticasone 50 MCG/ACT nasal spray Commonly known as: FLONASE Place 1 spray into both nostrils daily as needed for allergies. Use only needed   HYDROcodone-acetaminophen 5-325 MG tablet Commonly known as: NORCO/VICODIN Take 1 tablet by mouth every 6 (six) hours as needed for moderate pain.   levothyroxine 100 MCG tablet Commonly known as: SYNTHROID Take 100 mcg by mouth daily before breakfast.   losartan-hydrochlorothiazide 50-12.5 MG tablet Commonly known as: HYZAAR Take 1 tablet by mouth daily.   metFORMIN 500 MG 24 hr tablet Commonly known as: GLUCOPHAGE-XR Take 500 mg by mouth daily after supper.   metoprolol succinate 50 MG 24 hr tablet Commonly known as: TOPROL-XL Take 50 mg by mouth daily. Take with or immediately following a meal.   Multi-Vitamins Tabs Take 1 tablet by mouth daily.   niacin 500 MG tablet Commonly known as: VITAMIN B3 Take 500 mg by mouth daily.   Omega-3 1000 MG Caps Take 2,000 mg by mouth daily.   pravastatin 20 MG tablet Commonly known as: PRAVACHOL Take 20 mg by mouth daily.   promethazine-dextromethorphan 6.25-15 MG/5ML syrup Commonly known as: PROMETHAZINE-DM Take 5 mLs by mouth every 6 (six) hours as needed for cough.   sulfamethoxazole-trimethoprim 800-160 MG tablet Commonly known as: BACTRIM DS Take 1 tablet by mouth at bedtime.   Vitamin D (Ergocalciferol) 1.25 MG (50000 UNIT) Caps capsule Commonly known as: DRISDOL Take 50,000 Units by mouth every Monday.   zinc gluconate 50 MG tablet Take 50 mg by mouth daily.        Diagnostic Studies: Abdomen 1 View (KUB)  Result Date: 05/03/2022 CLINICAL DATA:  [id number] with renal stones. Check for stone fragments. EXAM: ABDOMEN - 1 VIEW COMPARISON:  AP pelvis and sacrococcygeal series from 04/12/2022 and CT of abdomen and pelvis 01/29/2023 FINDINGS: Interval insertion double-J right ureteral stent with the upper loop at the  level of the renal pelvis and the lower loop in the bladder in the midline inferiorly. The right kidney is difficult to evaluate due to overlying bowel gas and stool but there is probably still a portion of a staghorn calculus in the upper pole calices. There was previously a nearly 2 cm renal pelvis stone centered about the distal right L3 transverse process which is no longer seen. There are no visible stone fragments along the path of the stent either. There are pelvic phleboliths and right gonadal vein phleboliths. No pathologic calcification is seen on the left. The  bowel pattern is nonobstructive with mild constipation. There is no supine evidence for free air. There is osteopenia with degenerative changes of the spine and enthesopathic changes of the bony pelvis. IMPRESSION: 1. Interval insertion of a double-J right ureteral stent. 2. The previously noted nearly 2 cm renal pelvis stone centered about the distal right L3 transverse process is no longer seen. There are no visible stone fragments along the path of the stent either. 3. Right upper pole portion of a staghorn calculus likely is still present, but the right kidney difficult to evaluate due to overlying bowel gas and stool. Electronically Signed   By: Telford Nab M.D.   On: 05/03/2022 06:36   IR URETERAL STENT RIGHT NEW ACCESS W/O SEP NEPHROSTOMY CATH  Result Date: 05/02/2022 INDICATION: Renal stones, access for RIGHT percutaneous nephrolithotomy. EXAM: ABORTED RIGHT PERCUTANEOUS NEPHROURETERAL ACCESS FOR ANTICIPATED NEPHROLITHOTOMY COMPARISON:  CT AP, 01/28/2022 MEDICATIONS: Rocephin 2 gm IV; The antibiotic was administered in an appropriate time frame prior to skin puncture. ANESTHESIA/SEDATION: Moderate (conscious) sedation was employed during this procedure. A total of Versed 2 mg and Fentanyl 100 mcg was administered intravenously. Moderate Sedation Time: 73 minutes. The patient's level of consciousness and vital signs were monitored  continuously by radiology nursing throughout the procedure under my direct supervision. CONTRAST:  38m OMNIPAQUE IOHEXOL 300 MG/ML SOLN, 566mOMNIPAQUE IOHEXOL 300 MG/ML SOLN - Administered into the renal collecting system. FLUOROSCOPY TIME:  Fluoroscopic dose; 88 mGy COMPLICATIONS: None immediate. PROCEDURE: Informed written consent was obtained from the the patient and/or patient's representative after a discussion of the risks, benefits, and alternatives to treatment. The RIGHT flank region was prepped with Betadine in a sterile fashion, and a sterile drape was applied covering the operative field. A sterile gown and sterile gloves were used for the procedure. A timeout was performed prior to the initiation of the procedure. A pre procedural spot fluoroscopic image was obtained of the upper abdomen. Ultrasound scanning performed of the kidney was negative for significant hydronephrosis. As such, the stone within inferior renal collecting system was targeted fluoroscopically with a [place]. Despite multiple attempts, meticulous triangulation intravenous contrast administration and 2-stick puncture method, access into the decompressed collecting system was unable to be achieved with a microwire. After considerable effort, the case was discussed with the patient's urologist (Dr. JoIrine Sealand the procedure was aborted. The patient tolerated the procedure well without immediate post procedural complication. FINDINGS: Decompressed RIGHT renal collecting system, with access unable to be achieved despite considerable effort. IMPRESSION: Aborted/unsuccessful RIGHT percutaneous nephroureteral access for anticipated nephrolithotomy, as described above. These results were called by telephone at the time of procedure on 05/02/2022 to [facility] who verbally acknowledged these results. JoMichaelle BirksMD [facility] Electronically Signed   By:  JoMichaelle Birks.D.   On: 05/02/2022 17:49   DG C-Arm 1-60 Min-No Report  Result Date: 05/02/2022 Fluoroscopy was utilized by the requesting physician.  No radiographic interpretation.   DG C-Arm 1-60 Min-No Report  Result Date: 05/02/2022 Fluoroscopy was utilized by the requesting physician.  No radiographic interpretation.   DG Sacrum/Coccyx  Result Date: 04/12/2022 CLINICAL DATA:  Fall with pain to the coccyx EXAM: SACRUM AND COCCYX - 2+ VIEW COMPARISON:  CT 01/28/2022 FINDINGS: SI joint degenerative change. Pubic symphysis and rami appear intact. No definitive fracture is seen. IMPRESSION: Negative. Electronically Signed   By: KiDonavan Foil.D.   On: 04/12/2022 20:51  Disposition: Discharge disposition: 01-Home or Self Care       Discharge Instructions     Discontinue IV   Complete by: As directed         Follow-up Information     Hollace Hayward, NP Follow up on 05/12/2022.   Why: [id number] Contact information: [address]. [place] [address] [phone]                  Signed: Irine Seal 05/03/2022, 8:18 AM

## 2022-05-03 NOTE — Progress Notes (Signed)
Patient discharged home with daughter.  IV removed - WNL.  Reviewed AVS and medications.  Patient verbalized understanding, all questions answered satisfactorily.  Patient assisted off unit in NAD.

## 2022-05-04 ENCOUNTER — Encounter (HOSPITAL_COMMUNITY): Payer: Self-pay | Admitting: Urology

## 2022-05-04 ENCOUNTER — Other Ambulatory Visit: Payer: Self-pay

## 2022-05-05 ENCOUNTER — Ambulatory Visit (HOSPITAL_COMMUNITY)
Admission: RE | Admit: 2022-05-05 | Discharge: 2022-05-05 | Disposition: A | Discharge status: Home / Self Care | Payer: PPO | Source: Ambulatory Visit | Attending: Urology | Admitting: Urology

## 2022-05-05 ENCOUNTER — Ambulatory Visit (HOSPITAL_BASED_OUTPATIENT_CLINIC_OR_DEPARTMENT_OTHER): Payer: PPO | Admitting: Anesthesiology

## 2022-05-05 ENCOUNTER — Encounter (HOSPITAL_COMMUNITY)
Admission: RE | Disposition: A | Discharge status: Home / Self Care | Payer: Self-pay | Source: Ambulatory Visit | Attending: Urology

## 2022-05-05 ENCOUNTER — Ambulatory Visit (HOSPITAL_COMMUNITY): Payer: PPO | Admitting: Anesthesiology

## 2022-05-05 ENCOUNTER — Encounter (HOSPITAL_COMMUNITY): Payer: Self-pay | Admitting: Urology

## 2022-05-05 ENCOUNTER — Ambulatory Visit (HOSPITAL_COMMUNITY): Payer: PPO

## 2022-05-05 DIAGNOSIS — N12 Tubulo-interstitial nephritis, not specified as acute or chronic: Secondary | ICD-10-CM

## 2022-05-05 DIAGNOSIS — E559 Vitamin D deficiency, unspecified: Secondary | ICD-10-CM

## 2022-05-05 DIAGNOSIS — E039 Hypothyroidism, unspecified: Secondary | ICD-10-CM | POA: Diagnosis not present

## 2022-05-05 DIAGNOSIS — I10 Essential (primary) hypertension: Secondary | ICD-10-CM

## 2022-05-05 DIAGNOSIS — Z6833 Body mass index (BMI) 33.0-33.9, adult: Secondary | ICD-10-CM | POA: Diagnosis not present

## 2022-05-05 DIAGNOSIS — Z7984 Long term (current) use of oral hypoglycemic drugs: Secondary | ICD-10-CM | POA: Diagnosis not present

## 2022-05-05 DIAGNOSIS — N200 Calculus of kidney: Secondary | ICD-10-CM | POA: Insufficient documentation

## 2022-05-05 DIAGNOSIS — I2510 Atherosclerotic heart disease of native coronary artery without angina pectoris: Secondary | ICD-10-CM

## 2022-05-05 DIAGNOSIS — E119 Type 2 diabetes mellitus without complications: Secondary | ICD-10-CM

## 2022-05-05 DIAGNOSIS — E1169 Type 2 diabetes mellitus with other specified complication: Secondary | ICD-10-CM

## 2022-05-05 HISTORY — PX: CYSTOSCOPY/URETEROSCOPY/HOLMIUM LASER/STENT PLACEMENT: SHX6546

## 2022-05-05 LAB — GLUCOSE, CAPILLARY
Glucose-Capillary: 118 mg/dL — ABNORMAL HIGH (ref 70–99)
Glucose-Capillary: 117 mg/dL — ABNORMAL HIGH (ref 70–99)

## 2022-05-05 SURGERY — CYSTOSCOPY/URETEROSCOPY/HOLMIUM LASER/STENT PLACEMENT
Anesthesia: General | Laterality: Right

## 2022-05-05 MED ORDER — DEXAMETHASONE SODIUM PHOSPHATE 10 MG/ML IJ SOLN
INTRAMUSCULAR | Status: DC | PRN
  Administered 2022-05-05: 5 mg via INTRAVENOUS

## 2022-05-05 MED ORDER — ONDANSETRON HCL 4 MG/2ML IJ SOLN
INTRAMUSCULAR | Status: AC
  Filled 2022-05-05: qty 2

## 2022-05-05 MED ORDER — ORAL CARE MOUTH RINSE: 15.0000 mL | Freq: Once | OROMUCOSAL | Status: AC

## 2022-05-05 MED ORDER — PHENYLEPHRINE 80 MCG/ML (10ML) SYRINGE FOR IV PUSH (FOR BLOOD PRESSURE SUPPORT)
PREFILLED_SYRINGE | INTRAVENOUS | Status: DC | PRN
  Administered 2022-05-05: 80 ug via INTRAVENOUS
  Administered 2022-05-05: 160 ug via INTRAVENOUS
  Administered 2022-05-05: 80 ug via INTRAVENOUS
  Administered 2022-05-05: 160 ug via INTRAVENOUS

## 2022-05-05 MED ORDER — SODIUM CHLORIDE 0.9% FLUSH: 3.0000 mL | Freq: Two times a day (BID) | INTRAVENOUS | Status: DC

## 2022-05-05 MED ORDER — PROPOFOL 10 MG/ML IV BOLUS
INTRAVENOUS | Status: DC | PRN
  Administered 2022-05-05: 100 mg via INTRAVENOUS

## 2022-05-05 MED ORDER — EPHEDRINE SULFATE-NACL 50-0.9 MG/10ML-% IV SOSY
PREFILLED_SYRINGE | INTRAVENOUS | Status: DC | PRN
  Administered 2022-05-05 (×2): 5 mg via INTRAVENOUS

## 2022-05-05 MED ORDER — FENTANYL CITRATE PF 50 MCG/ML IJ SOSY: 25.0000 ug | PREFILLED_SYRINGE | INTRAMUSCULAR | Status: DC | PRN

## 2022-05-05 MED ORDER — EPHEDRINE 5 MG/ML INJ
INTRAVENOUS | Status: AC
  Filled 2022-05-05: qty 5

## 2022-05-05 MED ORDER — DEXAMETHASONE SODIUM PHOSPHATE 10 MG/ML IJ SOLN
INTRAMUSCULAR | Status: AC
  Filled 2022-05-05: qty 1

## 2022-05-05 MED ORDER — PHENYLEPHRINE 80 MCG/ML (10ML) SYRINGE FOR IV PUSH (FOR BLOOD PRESSURE SUPPORT)
PREFILLED_SYRINGE | INTRAVENOUS | Status: AC
  Filled 2022-05-05: qty 10

## 2022-05-05 MED ORDER — ACETAMINOPHEN 500 MG PO TABS
1000.0000 mg | ORAL_TABLET | Freq: Once | ORAL | Status: AC
  Administered 2022-05-05: 1000 mg via ORAL
  Filled 2022-05-05: qty 2

## 2022-05-05 MED ORDER — LIDOCAINE HCL (PF) 2 % IJ SOLN
INTRAMUSCULAR | Status: AC
  Filled 2022-05-05: qty 5

## 2022-05-05 MED ORDER — SODIUM CHLORIDE 0.9 % IV SOLN
2.0000 g | INTRAVENOUS | Status: AC
  Administered 2022-05-05: 2 g via INTRAVENOUS
  Filled 2022-05-05: qty 20

## 2022-05-05 MED ORDER — PROPOFOL 10 MG/ML IV BOLUS
INTRAVENOUS | Status: AC
  Filled 2022-05-05: qty 20

## 2022-05-05 MED ORDER — LACTATED RINGERS IV SOLN: INTRAVENOUS | Status: DC

## 2022-05-05 MED ORDER — CHLORHEXIDINE GLUCONATE 0.12 % MT SOLN
15.0000 mL | Freq: Once | OROMUCOSAL | Status: AC
  Administered 2022-05-05: 15 mL via OROMUCOSAL

## 2022-05-05 MED ORDER — ONDANSETRON HCL 4 MG/2ML IJ SOLN
INTRAMUSCULAR | Status: DC | PRN
  Administered 2022-05-05: 4 mg via INTRAVENOUS

## 2022-05-05 MED ORDER — ACETAMINOPHEN 500 MG PO TABS: 1000.0000 mg | ORAL_TABLET | Freq: Once | ORAL | Status: DC

## 2022-05-05 MED ORDER — FENTANYL CITRATE (PF) 100 MCG/2ML IJ SOLN
INTRAMUSCULAR | Status: AC
  Filled 2022-05-05: qty 2

## 2022-05-05 MED ORDER — LIDOCAINE 2% (20 MG/ML) 5 ML SYRINGE
INTRAMUSCULAR | Status: DC | PRN
  Administered 2022-05-05: 20 mg via INTRAVENOUS

## 2022-05-05 MED ORDER — SODIUM CHLORIDE 0.9 % IR SOLN
Status: DC | PRN
  Administered 2022-05-05: 3000 mL

## 2022-05-05 MED ORDER — FENTANYL CITRATE (PF) 100 MCG/2ML IJ SOLN
INTRAMUSCULAR | Status: DC | PRN
  Administered 2022-05-05 (×2): 25 ug via INTRAVENOUS
  Administered 2022-05-05: 50 ug via INTRAVENOUS

## 2022-05-05 SURGICAL SUPPLY — 24 items
BAG URO CATCHER STRL LF (MISCELLANEOUS) ×1 IMPLANT
BASKET STONE NCOMPASS (UROLOGICAL SUPPLIES) IMPLANT
CATH URETERAL DUAL LUMEN 10F (MISCELLANEOUS) IMPLANT
CATH URETL OPEN 5X70 (CATHETERS) IMPLANT
CLOTH BEACON ORANGE TIMEOUT ST (SAFETY) ×1 IMPLANT
EXTRACTOR STONE NITINOL NGAGE (UROLOGICAL SUPPLIES) IMPLANT
GLOVE SURG SS PI 8.0 STRL IVOR (GLOVE) ×1 IMPLANT
GOWN STRL REUS W/ TWL XL LVL3 (GOWN DISPOSABLE) ×1 IMPLANT
GOWN STRL REUS W/TWL XL LVL3 (GOWN DISPOSABLE) ×1
GUIDEWIRE STR DUAL SENSOR (WIRE) ×1 IMPLANT
IV NS IRRIG 3000ML ARTHROMATIC (IV SOLUTION) ×1 IMPLANT
KIT TURNOVER KIT A (KITS) IMPLANT
LASER FIB FLEXIVA PULSE ID 365 (Laser) IMPLANT
LASER FIB FLEXIVA PULSE ID 550 (Laser) IMPLANT
LASER FIB FLEXIVA PULSE ID 910 (Laser) IMPLANT
MANIFOLD NEPTUNE II (INSTRUMENTS) ×1 IMPLANT
PACK CYSTO (CUSTOM PROCEDURE TRAY) ×1 IMPLANT
SHEATH NAVIGATOR HD 11/13X36 (SHEATH) IMPLANT
SHEATH NAVIGATOR HD 12/14X36 (SHEATH) IMPLANT
STENT URET 6FRX24 CONTOUR (STENTS) IMPLANT
TRACTIP FLEXIVA PULS ID 200XHI (Laser) IMPLANT
TRACTIP FLEXIVA PULSE ID 200 (Laser)
TUBING CONNECTING 10 (TUBING) ×1 IMPLANT
TUBING UROLOGY SET (TUBING) ×1 IMPLANT

## 2022-05-05 NOTE — Interval H&P Note (Signed)
History and Physical Interval Note:  She is doing well. Her stone was fragmented and I will try to debulk the residual fragments today.   05/05/2022 10:55 AM  Melissa Davenport  has presented today for surgery, with the diagnosis of RIGHT STAGHORN STONE.  The various methods of treatment have been discussed with the patient and family. After consideration of risks, benefits and other options for treatment, the patient has consented to  Procedure(s): CYSTOSCOPY RIGHT URETEROSCOPY POSSIBLE HOLMIUM LASER STONE EXTRACTION AND STENT EXCHANGE (Right) as a surgical intervention.  The patient's history has been reviewed, patient examined, no change in status, stable for surgery.  I have reviewed the patient's chart and labs.  Questions were answered to the patient's satisfaction.     Irine Seal

## 2022-05-05 NOTE — Anesthesia Procedure Notes (Signed)
Procedure Name: LMA Insertion Date/Time: 05/05/2022 11:30 AM  Performed by: Maxwell Caul, CRNAPre-anesthesia Checklist: Patient identified, Emergency Drugs available, Suction available and Patient being monitored Oxygen Delivery Method: Simple face mask Preoxygenation: Pre-oxygenation with 100% oxygen Induction Type: IV induction LMA: LMA inserted LMA Size: 4.0 Number of attempts: 1 Placement Confirmation: positive ETCO2 and breath sounds checked- equal and bilateral Tube secured with: Tape Dental Injury: Teeth and Oropharynx as per pre-operative assessment  Comments: Very loose lower tooth. Careful placement of LMA and gauze placed in mouth for protection upon wake-up.

## 2022-05-05 NOTE — Anesthesia Preprocedure Evaluation (Addendum)
Anesthesia Evaluation  Patient identified by MRN, date of birth, ID band Patient awake    Reviewed: Allergy & Precautions, NPO status , Patient's Chart, lab work & pertinent test results, reviewed documented beta blocker date and time   History of Anesthesia Complications Negative for: history of anesthetic complications  Airway Mallampati: II  TM Distance: >3 FB Neck ROM: Full   Comment: Small mouth Dental  (+) Loose, Dental Advisory Given   Pulmonary neg pulmonary ROS   breath sounds clear to auscultation       Cardiovascular hypertension, Pt. on medications and Pt. on home beta blockers (-) angina + CAD (non-obstructive)   Rhythm:Regular Rate:Normal  '17 Cath: Mid RCA lesion, 10 %stenosed.  LV end diastolic pressure is mildly elevated.  '21 ECHO: EF 60-65%. The LVhas normal function, no regional wall motion abnormalities. There is mild asymmetric left ventricular LVH of the basal-septal segment. Grade I diastolic dysfunction (impaired relaxation). RV systolic function is normal, no significant valvular abnormalities   Neuro/Psych glaucoma    GI/Hepatic negative GI ROS, Neg liver ROS,,,  Endo/Other  diabetes (glu 118), Oral Hypoglycemic AgentsHypothyroidism  Morbid obesityBMI 33.8  Renal/GU Renal InsufficiencyRenal disease     Musculoskeletal  (+) Arthritis ,    Abdominal  (+) + obese  Peds  Hematology   Anesthesia Other Findings   Reproductive/Obstetrics                             Anesthesia Physical Anesthesia Plan  ASA: 3  Anesthesia Plan: General   Post-op Pain Management: Tylenol PO (pre-op)*   Induction: Intravenous  PONV Risk Score and Plan: 3 and Ondansetron, Dexamethasone and Treatment may vary due to age or medical condition  Airway Management Planned: LMA  Additional Equipment: None  Intra-op Plan:   Post-operative Plan:   Informed Consent: I have reviewed  the patients History and Physical, chart, labs and discussed the procedure including the risks, benefits and alternatives for the proposed anesthesia with the patient or authorized representative who has indicated his/her understanding and acceptance.     Dental advisory given  Plan Discussed with: CRNA and Surgeon  Anesthesia Plan Comments:        Anesthesia Quick Evaluation

## 2022-05-05 NOTE — Transfer of Care (Signed)
Immediate Anesthesia Transfer of Care Note  Patient: Melissa Davenport  Procedure(s) Performed: CYSTOSCOPY RIGHT URETEROSCOPY, HOLMIUM LASER STONE EXTRACTION AND STENT EXCHANGE (Right)  Patient Location: PACU  Anesthesia Type:General  Level of Consciousness: awake, alert , and oriented  Airway & Oxygen Therapy: Patient Spontanous Breathing and Patient connected to face mask oxygen  Post-op Assessment: Report given to RN and Post -op Vital signs reviewed and stable  Post vital signs: Reviewed and stable  Last Vitals:  Vitals Value Taken Time  BP    Temp    Pulse 72 05/05/22 1248  Resp 14 05/05/22 1248  SpO2 98 % 05/05/22 1248  Vitals shown include unvalidated device data.  Last Pain:  Vitals:   05/05/22 0926  TempSrc:   PainSc: 0-No pain      Patients Stated Pain Goal: 3 (53/66/44 0347)  Complications: No notable events documented.

## 2022-05-05 NOTE — Anesthesia Postprocedure Evaluation (Signed)
Anesthesia Post Note  Patient: Melissa Davenport  Procedure(s) Performed: CYSTOSCOPY RIGHT URETEROSCOPY, HOLMIUM LASER STONE EXTRACTION AND STENT EXCHANGE (Right)     Patient location during evaluation: Nursing Unit Anesthesia Type: General Level of consciousness: awake and alert, patient cooperative and oriented Pain management: pain level controlled Vital Signs Assessment: post-procedure vital signs reviewed and stable Respiratory status: nonlabored ventilation, spontaneous breathing and respiratory function stable Cardiovascular status: blood pressure returned to baseline and stable Postop Assessment: no apparent nausea or vomiting Anesthetic complications: no   No notable events documented.  Last Vitals:  Vitals:   05/05/22 1315 05/05/22 1328  BP: (!) 121/56 (!) 116/56  Pulse: 69 64  Resp: 13 15  Temp: 36.7 C 36.6 C  SpO2: 93% 94%    Last Pain:  Vitals:   05/05/22 1328  TempSrc:   PainSc: 0-No pain                 ,E. 

## 2022-05-05 NOTE — Op Note (Signed)
Procedure: 1.  Cystoscopy with right ureteroscopy with holmium laser application, stone extraction and stent exchange. 2.  Application of fluoroscopy.  Preop diagnosis: Right renal stones.  Postop diagnosis: Same.  Surgeon: Dr. Irine Seal.  Anesthesia: General.  Specimen: Stone fragments.  Drains: 6 French by 24 cm right contour double-J stent.  EBL: None.  Complications: None.  Indications: The patient is an 81 year old white female who initially went to the operating room on Tuesday of this week for ureteroscopy after failed attempt to the IR access for percutaneous nephrolithotomy.  Her stone was readily fragmented with the laser but on postop KUB there was a suggestion of some larger residual fragments in the lower calyx in addition to the well fragmented upper and mid calyceal stones.  It was felt that repeat ureteroscopy was indicated.  Procedure: She was taken operating room where she was given Rocephin.  A general anesthetic was induced.  She was placed in lithotomy position and fitted with PAS hose.  Her perineum and genitalia were prepped with Betadine solution she was draped in usual sterile fashion.  Cystoscopy was performed using the 21 [place] scope and 30 degree lens.  The stent loop was visualized at the right ureteral orifice.  The remainder of the bladder had been described in the previous note.  The stent loop was grasped with a grasping forceps and pulled the urethral meatus.  A sensor wire was then easily advanced the kidney under fluoroscopic guidance.  A 12/14 French 36 cm digital access sheath was then easily advanced over the wire to the kidney under fluoroscopic guidance.  The inner core and wire were then removed.  The dual-lumen digital flexible ureteroscope was then passed and initially an engage basket was used to retrieve several fragments but the bulk of the fragments were too small to be retrieved with the engage basket.  I then switched to an encompass  basket and had more success removing the bulk of the stone.  Throughout the procedure there was density over a lower calyx that was suggestive of either an accumulation of fragments or a large residual fragment but initially had difficulty accessing this calyx.  Eventually I was able to get into the calyx and there was indeed a large fragment which was grasped with the basket but required lasering to remove.  The 200 m tract tip laser was used with the 0.5 J dusting setting being adequate to fragment the stone.  Once the stone was reduced in size I was able to retrieve the remaining large fragment as well as some additional small fragments from the lower calyx.  Final inspection of this area only revealed a few small residual fragments.  Inspection the mid and upper calyces also demonstrated good some small residual fragments but her stone had been significantly debulked and it was felt that the remaining fragments should pass with adequate stent time.  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  As a result of this I felt that additional stent time was indeed indicated.  A guidewire was then advanced through the sheath to the kidney and the sheath was removed.  The cystoscope was then reinserted over the wire and a 6 [place] by 24 cm contour double-J stent without tether was advanced the kidney under fluoroscopic guidance.  The wire was removed, leaving good coil in the kidney and a good coil in the bladder.  The bladder was drained and the cystoscope was removed.  She was taken down from lithotomy position, her anesthetic was reversed and she was moved to recovery room in stable condition.  There were no complications.

## 2022-05-06 ENCOUNTER — Encounter (HOSPITAL_COMMUNITY): Payer: Self-pay | Admitting: Urology

## 2022-05-12 DIAGNOSIS — N200 Calculus of kidney: Secondary | ICD-10-CM | POA: Diagnosis not present

## 2022-05-12 DIAGNOSIS — R8271 Bacteriuria: Secondary | ICD-10-CM | POA: Diagnosis not present

## 2022-05-26 DIAGNOSIS — Z23 Encounter for immunization: Secondary | ICD-10-CM | POA: Diagnosis not present

## 2022-06-07 DIAGNOSIS — N201 Calculus of ureter: Secondary | ICD-10-CM | POA: Diagnosis not present

## 2022-06-07 DIAGNOSIS — N200 Calculus of kidney: Secondary | ICD-10-CM | POA: Diagnosis not present

## 2022-06-07 DIAGNOSIS — E278 Other specified disorders of adrenal gland: Secondary | ICD-10-CM | POA: Diagnosis not present

## 2022-06-07 DIAGNOSIS — N3000 Acute cystitis without hematuria: Secondary | ICD-10-CM | POA: Diagnosis not present

## 2022-06-23 DIAGNOSIS — N202 Calculus of kidney with calculus of ureter: Secondary | ICD-10-CM | POA: Diagnosis not present

## 2022-06-23 DIAGNOSIS — R8271 Bacteriuria: Secondary | ICD-10-CM | POA: Diagnosis not present

## 2022-07-07 DIAGNOSIS — N201 Calculus of ureter: Secondary | ICD-10-CM | POA: Diagnosis not present

## 2022-07-07 DIAGNOSIS — M160 Bilateral primary osteoarthritis of hip: Secondary | ICD-10-CM | POA: Diagnosis not present

## 2022-07-07 DIAGNOSIS — I878 Other specified disorders of veins: Secondary | ICD-10-CM | POA: Diagnosis not present

## 2022-07-07 DIAGNOSIS — E278 Other specified disorders of adrenal gland: Secondary | ICD-10-CM | POA: Diagnosis not present

## 2022-07-07 DIAGNOSIS — N3000 Acute cystitis without hematuria: Secondary | ICD-10-CM | POA: Diagnosis not present

## 2022-07-07 DIAGNOSIS — R8271 Bacteriuria: Secondary | ICD-10-CM | POA: Diagnosis not present

## 2022-07-20 DIAGNOSIS — N3000 Acute cystitis without hematuria: Secondary | ICD-10-CM | POA: Diagnosis not present

## 2022-07-20 DIAGNOSIS — N201 Calculus of ureter: Secondary | ICD-10-CM | POA: Diagnosis not present

## 2022-07-20 DIAGNOSIS — R8271 Bacteriuria: Secondary | ICD-10-CM | POA: Diagnosis not present

## 2022-07-25 ENCOUNTER — Ambulatory Visit: Payer: PPO | Admitting: Podiatry

## 2022-07-26 ENCOUNTER — Encounter: Payer: Self-pay | Admitting: Podiatry

## 2022-07-31 ENCOUNTER — Ambulatory Visit: Payer: PPO | Admitting: Podiatry

## 2022-08-04 DIAGNOSIS — N201 Calculus of ureter: Secondary | ICD-10-CM | POA: Diagnosis not present

## 2022-08-04 DIAGNOSIS — R8279 Other abnormal findings on microbiological examination of urine: Secondary | ICD-10-CM | POA: Diagnosis not present

## 2022-08-07 ENCOUNTER — Other Ambulatory Visit (HOSPITAL_BASED_OUTPATIENT_CLINIC_OR_DEPARTMENT_OTHER): Payer: Self-pay

## 2022-08-28 DIAGNOSIS — N3020 Other chronic cystitis without hematuria: Secondary | ICD-10-CM | POA: Diagnosis not present

## 2022-08-28 DIAGNOSIS — N200 Calculus of kidney: Secondary | ICD-10-CM | POA: Diagnosis not present

## 2022-09-11 ENCOUNTER — Ambulatory Visit: Payer: PPO | Admitting: Podiatry

## 2022-09-11 ENCOUNTER — Encounter: Payer: Self-pay | Admitting: Podiatry

## 2022-09-11 DIAGNOSIS — D2371 Other benign neoplasm of skin of right lower limb, including hip: Secondary | ICD-10-CM

## 2022-09-11 DIAGNOSIS — E119 Type 2 diabetes mellitus without complications: Secondary | ICD-10-CM

## 2022-09-11 DIAGNOSIS — D2372 Other benign neoplasm of skin of left lower limb, including hip: Secondary | ICD-10-CM | POA: Diagnosis not present

## 2022-09-11 DIAGNOSIS — M79676 Pain in unspecified toe(s): Secondary | ICD-10-CM

## 2022-09-11 DIAGNOSIS — B351 Tinea unguium: Secondary | ICD-10-CM | POA: Diagnosis not present

## 2022-09-11 NOTE — Progress Notes (Signed)
Presents today with a history of diabetes mellitus states that she needs her nails cut and her calluses trimmed.  Objective: Vital signs are stable alert and oriented x 3.  Pulses are palpable.  Multiple benign skin lesions plantar aspect of the bilateral foot is present.  Toenails are long thick yellow dystrophic onychomycotic sharply incurvated painful palpation.  Assessment: Pain limb secondary to onychomycosis and porokeratosis benign skin lesions.  Plan: Debridement of benign skin lesion debridement of toenails 1 through 5 bilateral.

## 2022-10-12 ENCOUNTER — Ambulatory Visit: Payer: PPO | Admitting: Internal Medicine

## 2022-10-23 ENCOUNTER — Other Ambulatory Visit: Payer: Self-pay | Admitting: Nurse Practitioner

## 2022-10-23 DIAGNOSIS — Z1231 Encounter for screening mammogram for malignant neoplasm of breast: Secondary | ICD-10-CM

## 2022-11-02 DIAGNOSIS — E559 Vitamin D deficiency, unspecified: Secondary | ICD-10-CM | POA: Diagnosis not present

## 2022-11-02 DIAGNOSIS — I10 Essential (primary) hypertension: Secondary | ICD-10-CM | POA: Diagnosis not present

## 2022-11-02 DIAGNOSIS — E782 Mixed hyperlipidemia: Secondary | ICD-10-CM | POA: Diagnosis not present

## 2022-11-02 DIAGNOSIS — E890 Postprocedural hypothyroidism: Secondary | ICD-10-CM | POA: Diagnosis not present

## 2022-11-02 DIAGNOSIS — E1165 Type 2 diabetes mellitus with hyperglycemia: Secondary | ICD-10-CM | POA: Diagnosis not present

## 2022-11-02 DIAGNOSIS — R809 Proteinuria, unspecified: Secondary | ICD-10-CM | POA: Diagnosis not present

## 2022-11-02 DIAGNOSIS — Z6835 Body mass index (BMI) 35.0-35.9, adult: Secondary | ICD-10-CM | POA: Diagnosis not present

## 2022-11-10 ENCOUNTER — Ambulatory Visit
Admission: RE | Admit: 2022-11-10 | Discharge: 2022-11-10 | Disposition: A | Discharge status: Home / Self Care | Payer: PPO | Source: Ambulatory Visit | Attending: Nurse Practitioner | Admitting: Nurse Practitioner

## 2022-11-10 DIAGNOSIS — Z1231 Encounter for screening mammogram for malignant neoplasm of breast: Secondary | ICD-10-CM

## 2022-11-17 DIAGNOSIS — R1031 Right lower quadrant pain: Secondary | ICD-10-CM | POA: Diagnosis not present

## 2022-11-17 DIAGNOSIS — R109 Unspecified abdominal pain: Secondary | ICD-10-CM | POA: Diagnosis not present

## 2022-11-17 DIAGNOSIS — N200 Calculus of kidney: Secondary | ICD-10-CM | POA: Diagnosis not present

## 2022-11-17 DIAGNOSIS — N281 Cyst of kidney, acquired: Secondary | ICD-10-CM | POA: Diagnosis not present

## 2022-11-21 DIAGNOSIS — Z6838 Body mass index (BMI) 38.0-38.9, adult: Secondary | ICD-10-CM | POA: Diagnosis not present

## 2022-11-21 DIAGNOSIS — N200 Calculus of kidney: Secondary | ICD-10-CM | POA: Diagnosis not present

## 2022-11-21 DIAGNOSIS — E1121 Type 2 diabetes mellitus with diabetic nephropathy: Secondary | ICD-10-CM | POA: Diagnosis not present

## 2022-11-21 DIAGNOSIS — M545 Low back pain, unspecified: Secondary | ICD-10-CM | POA: Diagnosis not present

## 2022-11-21 DIAGNOSIS — M25551 Pain in right hip: Secondary | ICD-10-CM | POA: Diagnosis not present

## 2022-11-30 ENCOUNTER — Encounter: Payer: Self-pay | Admitting: Internal Medicine

## 2022-11-30 ENCOUNTER — Ambulatory Visit: Payer: PPO | Attending: Internal Medicine | Admitting: Internal Medicine

## 2022-11-30 VITALS — BP 130/68 | HR 74 | Ht 63.0 in | Wt 195.4 lb

## 2022-11-30 DIAGNOSIS — E782 Mixed hyperlipidemia: Secondary | ICD-10-CM | POA: Diagnosis not present

## 2022-11-30 DIAGNOSIS — I517 Cardiomegaly: Secondary | ICD-10-CM

## 2022-11-30 DIAGNOSIS — Z7984 Long term (current) use of oral hypoglycemic drugs: Secondary | ICD-10-CM | POA: Diagnosis not present

## 2022-11-30 DIAGNOSIS — I10 Essential (primary) hypertension: Secondary | ICD-10-CM | POA: Diagnosis not present

## 2022-11-30 DIAGNOSIS — R9431 Abnormal electrocardiogram [ECG] [EKG]: Secondary | ICD-10-CM | POA: Diagnosis not present

## 2022-11-30 DIAGNOSIS — E1169 Type 2 diabetes mellitus with other specified complication: Secondary | ICD-10-CM | POA: Diagnosis not present

## 2022-11-30 NOTE — Patient Instructions (Signed)
Medication Instructions:  Your physician recommends that you continue on your current medications as directed. Please refer to the Current Medication list given to you today.   *If you need a refill on your cardiac medications before your next appointment, please call your pharmacy*   Lab Work: None ordered  If you have labs (blood work) drawn today and your tests are completely normal, you will receive your results only by: MyChart Message (if you have MyChart) OR A paper copy in the mail If you have any lab test that is abnormal or we need to change your treatment, we will call you to review the results.   Testing/Procedures: None ordered   Follow-Up: At [facility], you and your health needs are our priority.  As part of our continuing mission to provide you with exceptional heart care, we have created designated Provider Care Teams.  These Care Teams include your primary Cardiologist (physician) and Advanced Practice Providers (APPs -  Physician Assistants and Nurse Practitioners) who all work together to provide you with the care you need, when you need it.  We recommend signing up for the patient portal called "MyChart".  Sign up information is provided on this After Visit Summary.  MyChart is used to connect with patients for Virtual Visits (Telemedicine).  Patients are able to view lab/test results, encounter notes, upcoming appointments, etc.  Non-urgent messages can be sent to your provider as well.   To learn more about what you can do with MyChart, go to [email or web address].    Your next appointment:   AS NEEDED  Provider:   Parke Poisson, MD     Other Instructions

## 2022-11-30 NOTE — Progress Notes (Signed)
Cardiology Office Note:    Date:  11/30/2022   ID:  Melissa Davenport [birth year], MRN [id number]  PCP:  Moshe Cipro, NP  Cardiologist:  Parke Poisson, MD  Electrophysiologist:  None   Referring MD: Moshe Cipro, NP   Chief Complaint/Reason for Referral: LVH  History of Present Illness:    Melissa Davenport is a 82 y.o. female with a history of HTN, HLD, Graves disease s/p radioactive iodine ablation 07/08/09 with post ablative hypothyroidism, Type 2 diabetes.   Melissa Davenport is feeling well overall despite having a difficult last 6 months with RSV infection and then subsequently treatment of staghorn renal calculus.  She has recovered well and has no chest pain or shortness of breath today.  No interval syncope.  She has no active cardiovascular concerns.  Prior visits:  Her cardiovascular workup has included an episode of perioperative hypotension during induction of an axillary block for rotator cuff surgery. She was seen by my colleague Dr. Donnie Aho and echo showed moderate LVH by report, and cardiac catheterization showed nonobstructive CAD.   We performed an echo 04/2020, mild LVH, unable to get strain.   She feels well overall. Very mild chest pressure when sitting not with activity. We will watch this, she is not concerned and says she almost didn't mention it.  Past Medical History:  Diagnosis Date   Allergic rhinitis    Allergy    allergic rhinitis   Basal cell carcinoma    Cancer (HCC)    Cataract    Complication of anesthesia    pt had nerve block in right shoulder for surgery and pt stated she could not breathe after that and bl/ increased , surgery was at outpatient surgery facilty done by DR Ave Filter   Diabetes mellitus without complication (HCC)    Glaucoma    Graves disease    History of kidney stones    Hyperlipidemia    Hypertension    Hypothyroidism    LVH (left ventricular hypertrophy) 2017   Mild to moderate   Meniere's disease    Mixed  dyslipidemia    Osteopenia    Thyroid disease    Vitamin D deficiency     Past Surgical History:  Procedure Laterality Date   ABDOMINAL HYSTERECTOMY     CARDIAC CATHETERIZATION N/A 01/31/2016   Procedure: Left Heart Cath and Coronary Angiography;  Surgeon: Kathleene Hazel, MD;  Location: [facility] INVASIVE CV LAB;  Service: Cardiovascular;  Laterality: N/A;   CATARACT EXTRACTION Bilateral    COLONOSCOPY  2008 & 2014   CYSTOSCOPY/URETEROSCOPY/HOLMIUM LASER/STENT PLACEMENT Right 05/02/2022   Procedure: CYSTOSCOPY/URETEROSCOPY/HOLMIUM LASER/STENT PLACEMENT;  Surgeon: Bjorn Pippin, MD;  Location: [facility];  Service: Urology;  Laterality: Right;   CYSTOSCOPY/URETEROSCOPY/HOLMIUM LASER/STENT PLACEMENT Right 05/05/2022   Procedure: CYSTOSCOPY RIGHT URETEROSCOPY, HOLMIUM LASER STONE EXTRACTION AND STENT EXCHANGE;  Surgeon: Bjorn Pippin, MD;  Location: [facility];  Service: Urology;  Laterality: Right;   EYE SURGERY     FLEXIBLE SIGMOIDOSCOPY     IR URETERAL STENT RIGHT NEW ACCESS W/O SEP NEPHROSTOMY CATH  05/02/2022   left ankle surgery     left foot surgery     SPINE SURGERY      Current Medications: Current Meds  Medication Sig   acetaminophen (TYLENOL) 325 MG tablet Take 650 mg by mouth every 6 (six) hours as needed for moderate pain.   aspirin 81 MG tablet Take 81 mg by mouth daily.   bimatoprost (LUMIGAN) 0.01 % SOLN Place 1 drop  into both eyes at bedtime.    cetirizine (ZYRTEC) 10 MG tablet Take 10 mg by mouth daily.   Cholecalciferol 25 MCG (1000 UT) tablet Take 1,000 Units by mouth daily.   Dorzolamide HCl-Timolol Mal PF 2-0.5 % SOLN Place 1 drop into both eyes 2 (two) times daily.   fluticasone (FLONASE) 50 MCG/ACT nasal spray Place 1 spray into both nostrils daily as needed for allergies. Use only needed   HYDROcodone-acetaminophen (NORCO/VICODIN) 5-325 MG tablet Take 1 tablet by mouth every 6 (six) hours as needed for moderate pain.   levothyroxine (SYNTHROID, LEVOTHROID) 100 MCG tablet  Take 100 mcg by mouth daily before breakfast.   losartan-hydrochlorothiazide (HYZAAR) 50-12.5 MG per tablet Take 1 tablet by mouth daily.   metFORMIN (GLUCOPHAGE-XR) 500 MG 24 hr tablet Take 500 mg by mouth daily after supper.   metoprolol succinate (TOPROL-XL) 50 MG 24 hr tablet Take 50 mg by mouth daily. Take with or immediately following a meal.   Multiple Vitamin (MULTI-VITAMINS) TABS Take 1 tablet by mouth daily.   niacin 500 MG tablet Take 500 mg by mouth daily.   Omega-3 1000 MG CAPS Take 2,000 mg by mouth daily.   pravastatin (PRAVACHOL) 20 MG tablet Take 20 mg by mouth daily.   sulfamethoxazole-trimethoprim (BACTRIM DS) 800-160 MG tablet Take 1 tablet by mouth at bedtime.   Vitamin D, Ergocalciferol, (DRISDOL) 1.25 MG (50000 UNIT) CAPS capsule Take 50,000 Units by mouth every Monday.   zinc gluconate 50 MG tablet Take 50 mg by mouth daily.     Allergies:   Alphagan [brimonidine], Flagyl [metronidazole], Ketek [telithromycin], and Levaquin [levofloxacin]   Social History   Tobacco Use   Smoking status: Never   Smokeless tobacco: Never  Vaping Use   Vaping Use: Never used  Substance Use Topics   Alcohol use: No   Drug use: No     Family History: The patient's family history includes CVA in her sister; Diabetes in her sister; Emphysema in her father; Heart disease (age of onset: 23) in her father; Heart disease (age of onset: 72) in her mother; Hypothyroidism in her daughter. There is no history of Breast cancer.  ROS:   Please see the history of present illness.    All other systems reviewed and are negative.  EKGs/Labs/Other Studies Reviewed:    The following studies were reviewed today:  EKG:  EKG Interpretation  Date/Time:  Thursday November 30 2022 09:48:39 EDT Ventricular Rate:  62 PR Interval:  188 QRS Duration: 74 QT Interval:  400 QTC Calculation: 406 R Axis:   -12 Text Interpretation: Normal sinus rhythm Left ventricular hypertrophy with repolarization  abnormality ( R in aVL ) When compared with ECG of 28-Jan-2022 16:26, T wave abnormality is less prominent Confirmed by Weston Brass ([id number]) on 11/30/2022 9:50:45 AM   Prior: Normal sinus rhythm, ST-T wave abnormalities no change from prior  Recent Labs: 01/28/2022: ALT 27 01/29/2022: Magnesium 2.0 05/02/2022: BUN 27; Creatinine, Ser 1.26; Hemoglobin 15.6; Platelets 261; Potassium 4.1; Sodium 135  Recent Lipid Panel No results found for: "CHOL", "TRIG", "HDL", "CHOLHDL", "VLDL", "LDLCALC", "LDLDIRECT"  Physical Exam:    VS:  BP 130/68   Pulse 74   Ht 5\' 3"  (1.6 m)   Wt 195 lb 6.4 oz (88.6 kg)   SpO2 98%   BMI 34.61 kg/m     Wt Readings from Last 5 Encounters:  11/30/22 195 lb 6.4 oz (88.6 kg)  05/05/22 190 lb 11.2 oz (86.5 kg)  05/02/22 190  lb 11.2 oz (86.5 kg)  04/19/22 181 lb (82.1 kg)  01/28/22 197 lb 15.6 oz (89.8 kg)    Constitutional: No acute distress Eyes: sclera non-icteric, normal conjunctiva and lids ENMT: normal dentition, moist mucous membranes Cardiovascular: regular rhythm, normal rate, no murmurs. S1 and S2 normal. Radial pulses normal bilaterally. No jugular venous distention.  Respiratory: clear to auscultation bilaterally GI : normal bowel sounds, soft and nontender. No distention.   MSK: extremities warm, well perfused. No edema.  NEURO: grossly nonfocal exam, moves all extremities. PSYCH: alert and oriented x 3, normal mood and affect.   ASSESSMENT:    1. Left ventricular hypertrophy   2. Primary hypertension   3. Abnormal EKG   4. Mixed hyperlipidemia   5. Type 2 diabetes mellitus with other specified complication, without long-term current use of insulin (HCC)     PLAN:    Left ventricular hypertrophy - Plan: EKG 12-Lead Abnormal EKG - Plan: EKG 12-Lead -Mild LVH on last echo.  ECG is stable and T wave abnormality in fact has improved slightly since last EKG in August 2023.  Essential hypertension - Plan: EKG 12-Lead Blood pressure overall  stable, continue losartan/HCTZ 50-12 and half milligrams daily and metoprolol succinate 50 mg daily  Hyperlipidemia, unspecified hyperlipidemia type - Plan: EKG 12-Lead -Continues on pravastatin 20 mg daily.  We discussed that LDL is 92 and goal LDL is less than 70.  We discussed dietary modifications to make to improve this value.  If her next check of cholesterol remains above goal, could consider up titration of pravastatin.  Total time of encounter: 30 minutes total time of encounter, including 20 minutes spent in face-to-face patient care on the date of this encounter. This time includes coordination of care and counseling regarding above mentioned problem list. Remainder of non-face-to-face time involved reviewing chart documents/testing relevant to the patient encounter and documentation in the medical record. I have independently reviewed documentation from referring provider.   Weston Brass, MD, [facility] [place]  [facility]     Medication Adjustments/Labs and Tests Ordered: Current medicines are reviewed at length with the patient today.  Concerns regarding medicines are outlined above.   Orders Placed This Encounter  Procedures   EKG 12-Lead    No orders of the defined types were placed in this encounter.   Patient Instructions  Medication Instructions:  Your physician recommends that you continue on your current medications as directed. Please refer to the Current Medication list given to you today.   *If you need a refill on your cardiac medications before your next appointment, please call your pharmacy*   Lab Work: None ordered  If you have labs (blood work) drawn today and your tests are completely normal, you will receive your results only by: MyChart Message (if you have MyChart) OR A paper copy in the mail If you have any lab test that is abnormal or we need to change your treatment, we will call you to review the results.   Testing/Procedures: None  ordered   Follow-Up: At [facility], you and your health needs are our priority.  As part of our continuing mission to provide you with exceptional heart care, we have created designated Provider Care Teams.  These Care Teams include your primary Cardiologist (physician) and Advanced Practice Providers (APPs -  Physician Assistants and Nurse Practitioners) who all work together to provide you with the care you need, when you need it.  We recommend signing up for the patient  portal called "MyChart".  Sign up information is provided on this After Visit Summary.  MyChart is used to connect with patients for Virtual Visits (Telemedicine).  Patients are able to view lab/test results, encounter notes, upcoming appointments, etc.  Non-urgent messages can be sent to your provider as well.   To learn more about what you can do with MyChart, go to [email or web address].    Your next appointment:   AS NEEDED  Provider:   Parke Poisson, MD     Other Instructions

## 2022-12-03 NOTE — Therapy (Signed)
OUTPATIENT PHYSICAL THERAPY THORACOLUMBAR EVALUATION   Patient Name: Melissa Davenport MRN: [id number] DOB:Mar 24, 1941, 82 y.o., female Today's Date: 12/05/2022  END OF SESSION:  PT End of Session - 12/05/22 1016     Visit Number 1    Date for PT Re-Evaluation 01/30/23    Authorization Type HTA    Progress Note Due on Visit 10    PT Start Time 1016    PT Stop Time 1101    PT Time Calculation (min) 45 min    Activity Tolerance Patient tolerated treatment well    Behavior During Therapy WFL for tasks assessed/performed             Past Medical History:  Diagnosis Date   Allergic rhinitis    Allergy    allergic rhinitis   Basal cell carcinoma    Cancer (HCC)    Cataract    Complication of anesthesia    pt had nerve block in right shoulder for surgery and pt stated she could not breathe after that and bl/ increased , surgery was at outpatient surgery facilty done by DR Ave Filter   Diabetes mellitus without complication (HCC)    Glaucoma    Graves disease    History of kidney stones    Hyperlipidemia    Hypertension    Hypothyroidism    LVH (left ventricular hypertrophy) 2017   Mild to moderate   Meniere's disease    Mixed dyslipidemia    Osteopenia    Thyroid disease    Vitamin D deficiency    Past Surgical History:  Procedure Laterality Date   ABDOMINAL HYSTERECTOMY     CARDIAC CATHETERIZATION N/A 01/31/2016   Procedure: Left Heart Cath and Coronary Angiography;  Surgeon: Kathleene Hazel, MD;  Location: [facility] INVASIVE CV LAB;  Service: Cardiovascular;  Laterality: N/A;   CATARACT EXTRACTION Bilateral    COLONOSCOPY  2008 & 2014   CYSTOSCOPY/URETEROSCOPY/HOLMIUM LASER/STENT PLACEMENT Right 05/02/2022   Procedure: CYSTOSCOPY/URETEROSCOPY/HOLMIUM LASER/STENT PLACEMENT;  Surgeon: Bjorn Pippin, MD;  Location: [facility];  Service: Urology;  Laterality: Right;   CYSTOSCOPY/URETEROSCOPY/HOLMIUM LASER/STENT PLACEMENT Right 05/05/2022   Procedure: CYSTOSCOPY RIGHT  URETEROSCOPY, HOLMIUM LASER STONE EXTRACTION AND STENT EXCHANGE;  Surgeon: Bjorn Pippin, MD;  Location: [facility];  Service: Urology;  Laterality: Right;   EYE SURGERY     FLEXIBLE SIGMOIDOSCOPY     IR URETERAL STENT RIGHT NEW ACCESS W/O SEP NEPHROSTOMY CATH  05/02/2022   left ankle surgery     left foot surgery     SPINE SURGERY     Patient Active Problem List   Diagnosis Date Noted   Renal stones 05/02/2022   Pyelonephritis 01/28/2022   Allergic rhinitis 09/22/2021   Body mass index (BMI) 34.0-34.9, adult 09/22/2021   Cardiomegaly 09/22/2021   Chronic sinusitis 09/22/2021   Decreased estrogen level 09/22/2021   Microalbuminuria 09/22/2021   Mixed hyperlipidemia 09/22/2021   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   Senile osteopenia 09/22/2021   Type 2 diabetes mellitus with other specified complication (HCC) 09/22/2021   Vitamin D deficiency 09/22/2021   Abnormal EKG    Precordial pain    Brow ptosis 12/24/2013   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 12/24/2013   Eyelid retraction 04/17/2013   Bulging eyes 04/17/2013   Disease of thyroid gland 10/09/2012   Glaucoma suspect 04/17/2012   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   Pseudoaphakia 04/17/2012   Glaucoma suspect of both eyes 04/17/2012   Blepharitis 03/28/2012   Meibomian gland disease 03/28/2012   Binocular vision disorder  with diplopia 12/20/2011   Flajani disease 12/20/2011   BP (high blood pressure) 12/20/2011   Hypertropia 12/20/2011   Arthritis, degenerative 12/20/2011    PCP: Moshe Cipro, NP   REFERRING PROVIDER: Murlean Iba, NP   REFERRING DIAG: M51.36 (ICD-10-CM) - Other intervertebral disc degeneration, lumbar region   Rationale for Evaluation and Treatment: Rehabilitation  THERAPY DIAG:  Other low back pain  Cramp and spasm  Abnormal posture  ONSET DATE: 5 weeks ago  SUBJECTIVE:                                                                                                                                                                                            SUBJECTIVE STATEMENT: Patient began having back pain severely 5 weeks ago. Pain was in low back and moved into the right hip around to front. Had injection in hip which helped some. Relief with Salon Pas last Sunday. Sitting or lying is better. Walking is limited to 5-10 min.   PERTINENT HISTORY:  DM, HTN, spine surgery lumbar 50 years ago, kidney stones  PAIN:  Are you having pain? Yes: NPRS scale: 4/10 Pain location: Right low back and into front of hip Pain description: achy Aggravating factors: walking sitting in folding chair or firm chair Relieving factors: sitting and lying  PRECAUTIONS: None  WEIGHT BEARING RESTRICTIONS: No  FALLS:  Has patient fallen in last 6 months? No Had previous fall last year where she fell on her right side trying to kill a bug.  LIVING ENVIRONMENT: Lives with: lives alone Lives in: House/apartment Stairs: Yes: External: 4 steps; can reach both Has following equipment at home: Single point cane  OCCUPATION: retired  PLOF: Independent  PATIENT GOALS: get rid of all the pain  NEXT MD VISIT: October  OBJECTIVE:   DIAGNOSTIC FINDINGS:  Sacrum/Coccyx negative; Abdominal CT - neg  PATIENT SURVEYS:  FOTO 42 goal 59  SCREENING FOR RED FLAGS: Bowel or bladder incontinence: No Spinal tumors: No Cauda equina syndrome: No Compression fracture: No Abdominal aneurysm: No  COGNITION: Overall cognitive status: Within functional limits for tasks assessed     SENSATION: WFL  MUSCLE LENGTH: Tight R psoas, B HS and piriformis   POSTURE: forward head, decreased lumbar lordosis, and increased thoracic kyphosis  PALPATION: Right gluteals and psoas  LUMBAR ROM:   AROM eval  Flexion 40 pain and catch  Extension 10 pain  Right lateral flexion 15 pain  Left lateral flexion 23 pulling on R  Right rotation 50%  Left rotation 50%   (Blank rows = not  tested)  LOWER EXTREMITY ROM:   WFL (see flexibility  for tight areas)  LOWER EXTREMITY MMT:    MMT Right eval Left eval  Hip flexion 4+ 4  Hip extension 5 5  Hip abduction 4+ 4+  Hip adduction 4+ 5  Hip internal rotation    Hip external rotation    Knee flexion 5 5  Knee extension 5 5  Ankle dorsiflexion 5 5  Ankle plantarflexion    Ankle inversion    Ankle eversion     (Blank rows = not tested)  LUMBAR SPECIAL TESTS:  Straight leg raise test: Negative and Slump test: Positive R  FUNCTIONAL TESTS:  5 times sit to stand: 19.31 sec  GAIT: Distance walked: 20 Assistive device utilized: None Level of assistance: Complete Independence Comments: antalgic  TODAY'S TREATMENT:                                                                                                                              DATE:   12/04/22  See pt ed and HEP  PATIENT EDUCATION:  Education details: PT eval findings, anticipated POC, and initial HEP  Person educated: Patient Education method: Explanation, Demonstration, Verbal cues, and Handouts Education comprehension: verbalized understanding and returned demonstration  HOME EXERCISE PROGRAM: Access Code: [email or web address] URL: [email or web address] Date: 12/05/2022 Prepared by: Raynelle Fanning  Exercises - Supine Lower Trunk Rotation  - 2 x daily - 7 x weekly - 1 sets - 5 reps - 10 sec hold - Supine Bridge  - 2 x daily - 7 x weekly - 1-3 sets - 10 reps - 3 -5 sec hold - Supine Piriformis Stretch with Foot on Ground  - 2 x daily - 7 x weekly - 1 sets - 3 reps - 30 sec hold - Modified Thomas Stretch  - 1 x daily - 7 x weekly - 1 sets - 3 reps - 30 sec hold  ASSESSMENT:  CLINICAL IMPRESSION: Patient is a 82 y.o. female who was seen today for physical therapy evaluation and treatment for LBP of insidious onset beginning about 5 weeks ago. Pain is primarily R sided and moves into her hip flexor which is tight. Her pain limits her from standing,  walking and sitting in certain chairs. She will benefit from skilled PT to address these deficits.   OBJECTIVE IMPAIRMENTS: Abnormal gait, decreased activity tolerance, decreased ROM, decreased strength, increased muscle spasms, impaired flexibility, postural dysfunction, obesity, and pain.   ACTIVITY LIMITATIONS: bending, sitting, standing, and locomotion level  PARTICIPATION LIMITATIONS: cleaning, shopping, and community activity  PERSONAL FACTORS: Age, Fitness, and 3+ comorbidities: DM, HTN, previous back surgery, obesity  are also affecting patient's functional outcome.   REHAB POTENTIAL: Excellent  CLINICAL DECISION MAKING: Stable/uncomplicated  EVALUATION COMPLEXITY: Low   GOALS: Goals reviewed with patient? Yes  SHORT TERM GOALS: Target date: 12/26/2022   Patient will be independent with initial HEP.  Baseline:  Goal status: INITIAL  2.  Patient will report centralization of pain to low back.  Baseline:  Goal  status: INITIAL   LONG TERM GOALS: Target date: 01/30/2023   Patient will be independent with advanced/ongoing HEP to improve outcomes and carryover.  Baseline:  Goal status: INITIAL  2.  Patient will report > = 75% improvement in low back pain to improve QOL.  Baseline:  Goal status: INITIAL  3.  Patient will demonstrate unctional pain free lumbar ROM to perform ADLs.   Baseline:  Goal status: INITIAL  4.  Patient will demonstrate improved functional strength by decreasing 5XSTS by 5 sec or more  Baseline: 19 sec Goal status: INITIAL  5.  Patient will report 103 on lumbar FOTO to demonstrate improved functional ability.  Baseline: 42 Goal status: INITIAL   6.  Patient will tolerate 30 min of walking to perform normal ADLs and community activities. Baseline: 5-10 min Goal status: INITIAL    PLAN:  PT FREQUENCY: 2x/week  PT DURATION: 8 weeks  PLANNED INTERVENTIONS: Therapeutic exercises, Therapeutic activity, Neuromuscular re-education, Balance  training, Gait training, Patient/Family education, Self Care, Joint mobilization, Stair training, Aquatic Therapy, Dry Needling, Electrical stimulation, Spinal mobilization, Cryotherapy, Moist heat, and Manual therapy.  PLAN FOR NEXT SESSION: Review HEP, release R psoas, possible lumbar mobs, Work on lumbar mobility, LE flex/strength, progress to core strength as tolerated.   Solon Palm, PT 12/05/2022, 2:23 PM [facility] [address], [address] [place], [place] [address] Phone # [phone] Fax [phone]

## 2022-12-05 ENCOUNTER — Encounter: Payer: Self-pay | Admitting: Physical Therapy

## 2022-12-05 ENCOUNTER — Ambulatory Visit: Payer: PPO | Admitting: Physical Therapy

## 2022-12-05 ENCOUNTER — Other Ambulatory Visit: Payer: Self-pay

## 2022-12-05 DIAGNOSIS — R252 Cramp and spasm: Secondary | ICD-10-CM | POA: Insufficient documentation

## 2022-12-05 DIAGNOSIS — R293 Abnormal posture: Secondary | ICD-10-CM | POA: Insufficient documentation

## 2022-12-05 DIAGNOSIS — M5459 Other low back pain: Secondary | ICD-10-CM | POA: Insufficient documentation

## 2022-12-11 ENCOUNTER — Ambulatory Visit: Payer: PPO | Admitting: Physical Therapy

## 2022-12-11 DIAGNOSIS — H04123 Dry eye syndrome of bilateral lacrimal glands: Secondary | ICD-10-CM | POA: Diagnosis not present

## 2022-12-11 NOTE — Therapy (Signed)
OUTPATIENT PHYSICAL THERAPY THORACOLUMBAR TREATMENT   Patient Name: Melissa Davenport MRN: [id number] DOB:[birth year], 82 y.o., female Today's Date: 12/12/2022  END OF SESSION:  PT End of Session - 12/12/22 0933     Visit Number 2    Date for PT Re-Evaluation 01/30/23    Authorization Type HTA    Progress Note Due on Visit 10    PT Start Time [phone]    PT Stop Time 1018    PT Time Calculation (min) 47 min    Activity Tolerance Patient tolerated treatment well    Behavior During Therapy [facility] for tasks assessed/performed              Past Medical History:  Diagnosis Date   Allergic rhinitis    Allergy    allergic rhinitis   Basal cell carcinoma    Cancer (HCC)    Cataract    Complication of anesthesia    pt had nerve block in right shoulder for surgery and pt stated she could not breathe after that and bl/ increased , surgery was at outpatient surgery facilty done by DR Ave Filter   Diabetes mellitus without complication (HCC)    Glaucoma    Graves disease    History of kidney stones    Hyperlipidemia    Hypertension    Hypothyroidism    LVH (left ventricular hypertrophy) 2017   Mild to moderate   Meniere's disease    Mixed dyslipidemia    Osteopenia    Thyroid disease    Vitamin D deficiency    Past Surgical History:  Procedure Laterality Date   ABDOMINAL HYSTERECTOMY     CARDIAC CATHETERIZATION N/A 01/31/2016   Procedure: Left Heart Cath and Coronary Angiography;  Surgeon: Kathleene Hazel, MD;  Location: [facility] INVASIVE CV LAB;  Service: Cardiovascular;  Laterality: N/A;   CATARACT EXTRACTION Bilateral    COLONOSCOPY  2008 & 2014   CYSTOSCOPY/URETEROSCOPY/HOLMIUM LASER/STENT PLACEMENT Right 05/02/2022   Procedure: CYSTOSCOPY/URETEROSCOPY/HOLMIUM LASER/STENT PLACEMENT;  Surgeon: Bjorn Pippin, MD;  Location: [facility];  Service: Urology;  Laterality: Right;   CYSTOSCOPY/URETEROSCOPY/HOLMIUM LASER/STENT PLACEMENT Right 05/05/2022   Procedure: CYSTOSCOPY RIGHT  URETEROSCOPY, HOLMIUM LASER STONE EXTRACTION AND STENT EXCHANGE;  Surgeon: Bjorn Pippin, MD;  Location: [facility];  Service: Urology;  Laterality: Right;   EYE SURGERY     FLEXIBLE SIGMOIDOSCOPY     IR URETERAL STENT RIGHT NEW ACCESS W/O SEP NEPHROSTOMY CATH  05/02/2022   left ankle surgery     left foot surgery     SPINE SURGERY     Patient Active Problem List   Diagnosis Date Noted   Renal stones 05/02/2022   Pyelonephritis 01/28/2022   Allergic rhinitis 09/22/2021   Body mass index (BMI) 34.0-34.9, adult 09/22/2021   Cardiomegaly 09/22/2021   Chronic sinusitis 09/22/2021   Decreased estrogen level 09/22/2021   Microalbuminuria 09/22/2021   Mixed hyperlipidemia 09/22/2021   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   Senile osteopenia 09/22/2021   Type 2 diabetes mellitus with other specified complication (HCC) 09/22/2021   Vitamin D deficiency 09/22/2021   Abnormal EKG    Precordial pain    Brow ptosis 12/24/2013   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 12/24/2013   Eyelid retraction 04/17/2013   Bulging eyes 04/17/2013   Disease of thyroid gland 10/09/2012   Glaucoma suspect 04/17/2012   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   Pseudoaphakia 04/17/2012   Glaucoma suspect of both eyes 04/17/2012   Blepharitis 03/28/2012   Meibomian gland disease 03/28/2012   Binocular vision  disorder with diplopia 12/20/2011   Flajani disease 12/20/2011   BP (high blood pressure) 12/20/2011   Hypertropia 12/20/2011   Arthritis, degenerative 12/20/2011    PCP: Moshe Cipro, NP   REFERRING PROVIDER: Murlean Iba, NP   REFERRING DIAG: M51.36 (ICD-10-CM) - Other intervertebral disc degeneration, lumbar region   Rationale for Evaluation and Treatment: Rehabilitation  THERAPY DIAG:  Other low back pain  Cramp and spasm  Abnormal posture  ONSET DATE: 5 weeks ago  SUBJECTIVE:                                                                                                                                                                                            SUBJECTIVE STATEMENT: Pain is about the same.    PERTINENT HISTORY:  DM, HTN, spine surgery lumbar 50 years ago, kidney stones  PAIN:  Are you having pain? Yes: NPRS scale: 4/10 Pain location: Right low back and into front of hip Pain description: achy Aggravating factors: walking sitting in folding chair or firm chair Relieving factors: sitting and lying  PRECAUTIONS: None  WEIGHT BEARING RESTRICTIONS: No  FALLS:  Has patient fallen in last 6 months? No Had previous fall last year where she fell on her right side trying to kill a bug.  LIVING ENVIRONMENT: Lives with: lives alone Lives in: House/apartment Stairs: Yes: External: 4 steps; can reach both Has following equipment at home: Single point cane  OCCUPATION: retired  PLOF: Independent  PATIENT GOALS: get rid of all the pain  NEXT MD VISIT: October  OBJECTIVE:   DIAGNOSTIC FINDINGS:  Sacrum/Coccyx negative; Abdominal CT - neg  PATIENT SURVEYS:  FOTO 42 goal 59  SCREENING FOR RED FLAGS: Bowel or bladder incontinence: No Spinal tumors: No Cauda equina syndrome: No Compression fracture: No Abdominal aneurysm: No  COGNITION: Overall cognitive status: Within functional limits for tasks assessed     SENSATION: WFL  MUSCLE LENGTH: Tight R psoas, B HS and piriformis   POSTURE: forward head, decreased lumbar lordosis, and increased thoracic kyphosis  PALPATION: Right gluteals and psoas  LUMBAR ROM:   AROM eval  Flexion 40 pain and catch  Extension 10 pain  Right lateral flexion 15 pain  Left lateral flexion 23 pulling on R  Right rotation 50%  Left rotation 50%   (Blank rows = not tested)  LOWER EXTREMITY ROM:   WFL (see flexibility for tight areas)  LOWER EXTREMITY MMT:    MMT Right eval Left eval  Hip flexion 4+ 4  Hip extension 5 5  Hip abduction 4+ 4+  Hip adduction 4+ 5  Hip internal rotation  Hip  external rotation    Knee flexion 5 5  Knee extension 5 5  Ankle dorsiflexion 5 5  Ankle plantarflexion    Ankle inversion    Ankle eversion     (Blank rows = not tested)  LUMBAR SPECIAL TESTS:  Straight leg raise test: Negative and Slump test: Positive R  FUNCTIONAL TESTS:  5 times sit to stand: 19.31 sec  GAIT: Distance walked: 20 Assistive device utilized: None Level of assistance: Complete Independence Comments: antalgic  TODAY'S TREATMENT:                                                                                                                              DATE:   12/12/22 Nustep L4 x 5 min PT present to assess status Seated hip flexor stretch x 30 sec R, standing hip flex stretch at stairs x 30 sec R LTR 10 sec hold x 5 B Supine pf stretch with towel or strap (cross body) 2 x 30 sec B Bridge  2 x 10 Pelvic press x 5 with 5 sec hold, with knee bends unilaterally x 5 ea, with hip ext x 5 ea Seated marching x 5 ea with ab draw in Manual: TPR to R QL and psoas; gentle UPA mobs to L3/4-L5/S1 B, R long leg distraction   12/04/22  See pt ed and HEP  PATIENT EDUCATION:  Education details: HEP update  Person educated: Patient Education method: Explanation, Demonstration, Verbal cues, and Handouts Education comprehension: verbalized understanding and returned demonstration  HOME EXERCISE PROGRAM: Access Code: [email or web address] URL: [email or web address] Date: 12/12/2022 Prepared by: Raynelle Fanning  Exercises - Supine Lower Trunk Rotation  - 2 x daily - 7 x weekly - 1 sets - 5 reps - 10 sec hold - Supine Bridge  - 2 x daily - 7 x weekly - 1-3 sets - 10 reps - 3 -5 sec hold - Supine Piriformis Stretch with Foot on Ground  - 2 x daily - 7 x weekly - 1 sets - 3 reps - 30 sec hold - Seated March  - 1 x daily - 3-4 x weekly - 1 sets - 10 reps - Hip Flexor Stretch with Chair  - 2 x daily - 7 x weekly - 1 sets - 3 reps - 30 sec hold  ASSESSMENT:  CLINICAL  IMPRESSION: Briyanna presents with ongoing pain in R psoas and low back. She tolerated TE well although prone exercises are difficult. She will benefit from ongoing STM, IASTM and TPR to her right psoas and lumbar next visit. She continues to demonstrate potential for improvement and would benefit from continued skilled therapy to address impairments.    OBJECTIVE IMPAIRMENTS: Abnormal gait, decreased activity tolerance, decreased ROM, decreased strength, increased muscle spasms, impaired flexibility, postural dysfunction, obesity, and pain.   ACTIVITY LIMITATIONS: bending, sitting, standing, and locomotion level  PARTICIPATION LIMITATIONS: cleaning, shopping, and community activity  PERSONAL FACTORS: Age, Fitness, and 3+ comorbidities: DM, HTN, previous  back surgery, obesity  are also affecting patient's functional outcome.   REHAB POTENTIAL: Excellent  CLINICAL DECISION MAKING: Stable/uncomplicated  EVALUATION COMPLEXITY: Low   GOALS: Goals reviewed with patient? Yes  SHORT TERM GOALS: Target date: 12/26/2022   Patient will be independent with initial HEP.  Baseline:  Goal status: INITIAL  2.  Patient will report centralization of pain to low back.  Baseline:  Goal status: INITIAL   LONG TERM GOALS: Target date: 01/30/2023   Patient will be independent with advanced/ongoing HEP to improve outcomes and carryover.  Baseline:  Goal status: INITIAL  2.  Patient will report > = 75% improvement in low back pain to improve QOL.  Baseline:  Goal status: INITIAL  3.  Patient will demonstrate unctional pain free lumbar ROM to perform ADLs.   Baseline:  Goal status: INITIAL  4.  Patient will demonstrate improved functional strength by decreasing 5XSTS by 5 sec or more  Baseline: 19 sec Goal status: INITIAL  5.  Patient will report 18 on lumbar FOTO to demonstrate improved functional ability.  Baseline: 42 Goal status: INITIAL   6.  Patient will tolerate 30 min of walking to  perform normal ADLs and community activities. Baseline: 5-10 min Goal status: INITIAL    PLAN:  PT FREQUENCY: 2x/week  PT DURATION: 8 weeks  PLANNED INTERVENTIONS: Therapeutic exercises, Therapeutic activity, Neuromuscular re-education, Balance training, Gait training, Patient/Family education, Self Care, Joint mobilization, Stair training, Aquatic Therapy, Dry Needling, Electrical stimulation, Spinal mobilization, Cryotherapy, Moist heat, and Manual therapy.  PLAN FOR NEXT SESSION:release R psoas, possible lumbar mobs, Work on lumbar mobility, LE flex/strength, progress to core strength as tolerated.   Solon Palm, PT 12/12/2022, 10:26 AM [facility] [address] [place], [place] [address] Phone # [phone] Fax [phone]

## 2022-12-12 ENCOUNTER — Encounter: Payer: Self-pay | Admitting: Physical Therapy

## 2022-12-12 ENCOUNTER — Ambulatory Visit: Payer: PPO | Admitting: Physical Therapy

## 2022-12-12 DIAGNOSIS — R293 Abnormal posture: Secondary | ICD-10-CM

## 2022-12-12 DIAGNOSIS — M5459 Other low back pain: Secondary | ICD-10-CM | POA: Diagnosis not present

## 2022-12-12 DIAGNOSIS — R252 Cramp and spasm: Secondary | ICD-10-CM

## 2022-12-14 ENCOUNTER — Ambulatory Visit: Payer: PPO | Admitting: Physical Therapy

## 2022-12-14 ENCOUNTER — Encounter: Payer: Self-pay | Admitting: Physical Therapy

## 2022-12-14 DIAGNOSIS — R293 Abnormal posture: Secondary | ICD-10-CM

## 2022-12-14 DIAGNOSIS — R252 Cramp and spasm: Secondary | ICD-10-CM

## 2022-12-14 DIAGNOSIS — M5459 Other low back pain: Secondary | ICD-10-CM

## 2022-12-14 NOTE — Therapy (Signed)
OUTPATIENT PHYSICAL THERAPY THORACOLUMBAR TREATMENT   Patient Name: Melissa Davenport MRN: [id number] DOB:Oct 05, 1940, 82 y.o., female Today's Date: 12/14/2022  END OF SESSION:  PT End of Session - 12/14/22 0932     Visit Number 3    Date for PT Re-Evaluation 01/30/23    Authorization Type HTA    Progress Note Due on Visit 10    PT Start Time 0932    PT Stop Time 1016    PT Time Calculation (min) 44 min    Activity Tolerance Patient tolerated treatment well    Behavior During Therapy [facility] for tasks assessed/performed               Past Medical History:  Diagnosis Date   Allergic rhinitis    Allergy    allergic rhinitis   Basal cell carcinoma    Cancer (HCC)    Cataract    Complication of anesthesia    pt had nerve block in right shoulder for surgery and pt stated she could not breathe after that and bl/ increased , surgery was at outpatient surgery facilty done by DR Ave Filter   Diabetes mellitus without complication (HCC)    Glaucoma    Graves disease    History of kidney stones    Hyperlipidemia    Hypertension    Hypothyroidism    LVH (left ventricular hypertrophy) 2017   Mild to moderate   Meniere's disease    Mixed dyslipidemia    Osteopenia    Thyroid disease    Vitamin D deficiency    Past Surgical History:  Procedure Laterality Date   ABDOMINAL HYSTERECTOMY     CARDIAC CATHETERIZATION N/A 01/31/2016   Procedure: Left Heart Cath and Coronary Angiography;  Surgeon: Kathleene Hazel, MD;  Location: [facility] INVASIVE CV LAB;  Service: Cardiovascular;  Laterality: N/A;   CATARACT EXTRACTION Bilateral    COLONOSCOPY  2008 & 2014   CYSTOSCOPY/URETEROSCOPY/HOLMIUM LASER/STENT PLACEMENT Right 05/02/2022   Procedure: CYSTOSCOPY/URETEROSCOPY/HOLMIUM LASER/STENT PLACEMENT;  Surgeon: Bjorn Pippin, MD;  Location: [facility];  Service: Urology;  Laterality: Right;   CYSTOSCOPY/URETEROSCOPY/HOLMIUM LASER/STENT PLACEMENT Right 05/05/2022   Procedure: CYSTOSCOPY RIGHT  URETEROSCOPY, HOLMIUM LASER STONE EXTRACTION AND STENT EXCHANGE;  Surgeon: Bjorn Pippin, MD;  Location: [facility];  Service: Urology;  Laterality: Right;   EYE SURGERY     FLEXIBLE SIGMOIDOSCOPY     IR URETERAL STENT RIGHT NEW ACCESS W/O SEP NEPHROSTOMY CATH  05/02/2022   left ankle surgery     left foot surgery     SPINE SURGERY     Patient Active Problem List   Diagnosis Date Noted   Renal stones 05/02/2022   Pyelonephritis 01/28/2022   Allergic rhinitis 09/22/2021   Body mass index (BMI) 34.0-34.9, adult 09/22/2021   Cardiomegaly 09/22/2021   Chronic sinusitis 09/22/2021   Decreased estrogen level 09/22/2021   Microalbuminuria 09/22/2021   Mixed hyperlipidemia 09/22/2021   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   Senile osteopenia 09/22/2021   Type 2 diabetes mellitus with other specified complication (HCC) 09/22/2021   Vitamin D deficiency 09/22/2021   Abnormal EKG    Precordial pain    Brow ptosis 12/24/2013   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 12/24/2013   Eyelid retraction 04/17/2013   Bulging eyes 04/17/2013   Disease of thyroid gland 10/09/2012   Glaucoma suspect 04/17/2012   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   Pseudoaphakia 04/17/2012   Glaucoma suspect of both eyes 04/17/2012   Blepharitis 03/28/2012   Meibomian gland disease 03/28/2012   Binocular  vision disorder with diplopia 12/20/2011   Flajani disease 12/20/2011   BP (high blood pressure) 12/20/2011   Hypertropia 12/20/2011   Arthritis, degenerative 12/20/2011    PCP: Moshe Cipro, NP   REFERRING PROVIDER: Murlean Iba, NP   REFERRING DIAG: M51.36 (ICD-10-CM) - Other intervertebral disc degeneration, lumbar region   Rationale for Evaluation and Treatment: Rehabilitation  THERAPY DIAG:  Other low back pain  Cramp and spasm  Abnormal posture  ONSET DATE: 5 weeks ago  SUBJECTIVE:                                                                                                                                                                                            SUBJECTIVE STATEMENT: Pain is about the same.  I was very sore later in the day after last session but it was better the next day.  PERTINENT HISTORY:  DM, HTN, spine surgery lumbar 50 years ago, kidney stones  PAIN:  Are you having pain? Yes: NPRS scale: 4/10 Pain location: Right low back and into front of hip Pain description: achy Aggravating factors: walking sitting in folding chair or firm chair Relieving factors: sitting and lying  PRECAUTIONS: None  WEIGHT BEARING RESTRICTIONS: No  FALLS:  Has patient fallen in last 6 months? No Had previous fall last year where she fell on her right side trying to kill a bug.  LIVING ENVIRONMENT: Lives with: lives alone Lives in: House/apartment Stairs: Yes: External: 4 steps; can reach both Has following equipment at home: Single point cane  OCCUPATION: retired  PLOF: Independent  PATIENT GOALS: get rid of all the pain  NEXT MD VISIT: October  OBJECTIVE:   DIAGNOSTIC FINDINGS:  Sacrum/Coccyx negative; Abdominal CT - neg  PATIENT SURVEYS:  FOTO 42 goal 59  SCREENING FOR RED FLAGS: Bowel or bladder incontinence: No Spinal tumors: No Cauda equina syndrome: No Compression fracture: No Abdominal aneurysm: No  COGNITION: Overall cognitive status: Within functional limits for tasks assessed     SENSATION: WFL  MUSCLE LENGTH: Tight R psoas, B HS and piriformis   POSTURE: forward head, decreased lumbar lordosis, and increased thoracic kyphosis  PALPATION: Right gluteals and psoas  LUMBAR ROM:   AROM eval  Flexion 40 pain and catch  Extension 10 pain  Right lateral flexion 15 pain  Left lateral flexion 23 pulling on R  Right rotation 50%  Left rotation 50%   (Blank rows = not tested)  LOWER EXTREMITY ROM:   WFL (see flexibility for tight areas)  LOWER EXTREMITY MMT:    MMT Right eval Left eval  Hip flexion 4+ 4  Hip  extension 5 5  Hip abduction 4+ 4+  Hip adduction 4+ 5  Hip internal rotation    Hip external rotation    Knee flexion 5 5  Knee extension 5 5  Ankle dorsiflexion 5 5  Ankle plantarflexion    Ankle inversion    Ankle eversion     (Blank rows = not tested)  LUMBAR SPECIAL TESTS:  Straight leg raise test: Negative and Slump test: Positive R  FUNCTIONAL TESTS:  5 times sit to stand: 19.31 sec  GAIT: Distance walked: 20 Assistive device utilized: None Level of assistance: Complete Independence Comments: antalgic  TODAY'S TREATMENT:                                                                                                                              DATE:   12/14/22 Nustep L4 x 5 min PT present to assess status Standing Rt hip flexor stretch foot on 2nd step 2x20" Seated core series holding blue plyoball: hip to hip, hip to shoulder x 10 each Seated lumbar flexion 3-way ball rollouts x 8 each way Seated SB trunk A/ROM x 3 rounds each way Prone manual therapy: bil lumbar STM  Prone pelvic press x 5 with 5 sec hold, with knee bends unilaterally x 5 ea, with hip ext x 5 ea Supine Addaday assisted massage to Rt thigh and anterior hip, manual TPR Rt hip flexor  12/12/22 Nustep L4 x 5 min PT present to assess status Seated hip flexor stretch x 30 sec R, standing hip flex stretch at stairs x 30 sec R LTR 10 sec hold x 5 B Supine pf stretch with towel or strap (cross body) 2 x 30 sec B Bridge  2 x 10 Pelvic press x 5 with 5 sec hold, with knee bends unilaterally x 5 ea, with hip ext x 5 ea Seated marching x 5 ea with ab draw in Manual: TPR to R QL and psoas; gentle UPA mobs to L3/4-L5/S1 B, R long leg distraction   12/04/22  See pt ed and HEP  PATIENT EDUCATION:  Education details: HEP update  Person educated: Patient Education method: Explanation, Demonstration, Verbal cues, and Handouts Education comprehension: verbalized understanding and returned demonstration  HOME  EXERCISE PROGRAM: Access Code: [email or web address] URL: [email or web address] Date: 12/12/2022 Prepared by: Raynelle Fanning  Exercises - Supine Lower Trunk Rotation  - 2 x daily - 7 x weekly - 1 sets - 5 reps - 10 sec hold - Supine Bridge  - 2 x daily - 7 x weekly - 1-3 sets - 10 reps - 3 -5 sec hold - Supine Piriformis Stretch with Foot on Ground  - 2 x daily - 7 x weekly - 1 sets - 3 reps - 30 sec hold - Seated March  - 1 x daily - 3-4 x weekly - 1 sets - 10 reps - Hip Flexor Stretch with Chair  - 2 x daily - 7 x weekly - 1 sets -  3 reps - 30 sec hold  ASSESSMENT:  CLINICAL IMPRESSION: Phinley presents with ongoing pain in R psoas and low back. She continues to be very tight in quad and hip flexor on Rt.  Lumbar soft tissues were tender but less tight on Rt today compared to Lt suggesting good carry over of release from last visit.  Encouraged Pt to drink more water after session to mitigate some post-massage soreness.    OBJECTIVE IMPAIRMENTS: Abnormal gait, decreased activity tolerance, decreased ROM, decreased strength, increased muscle spasms, impaired flexibility, postural dysfunction, obesity, and pain.   ACTIVITY LIMITATIONS: bending, sitting, standing, and locomotion level  PARTICIPATION LIMITATIONS: cleaning, shopping, and community activity  PERSONAL FACTORS: Age, Fitness, and 3+ comorbidities: DM, HTN, previous back surgery, obesity  are also affecting patient's functional outcome.   REHAB POTENTIAL: Excellent  CLINICAL DECISION MAKING: Stable/uncomplicated  EVALUATION COMPLEXITY: Low   GOALS: Goals reviewed with patient? Yes  SHORT TERM GOALS: Target date: 12/26/2022   Patient will be independent with initial HEP.  Baseline:  Goal status: ongoing  2.  Patient will report centralization of pain to low back.  Baseline:  Goal status: INITIAL   LONG TERM GOALS: Target date: 01/30/2023   Patient will be independent with advanced/ongoing HEP to improve outcomes and  carryover.  Baseline:  Goal status: INITIAL  2.  Patient will report > = 75% improvement in low back pain to improve QOL.  Baseline:  Goal status: INITIAL  3.  Patient will demonstrate unctional pain free lumbar ROM to perform ADLs.   Baseline:  Goal status: INITIAL  4.  Patient will demonstrate improved functional strength by decreasing 5XSTS by 5 sec or more  Baseline: 19 sec Goal status: INITIAL  5.  Patient will report 7 on lumbar FOTO to demonstrate improved functional ability.  Baseline: 42 Goal status: INITIAL   6.  Patient will tolerate 30 min of walking to perform normal ADLs and community activities. Baseline: 5-10 min Goal status: INITIAL    PLAN:  PT FREQUENCY: 2x/week  PT DURATION: 8 weeks  PLANNED INTERVENTIONS: Therapeutic exercises, Therapeutic activity, Neuromuscular re-education, Balance training, Gait training, Patient/Family education, Self Care, Joint mobilization, Stair training, Aquatic Therapy, Dry Needling, Electrical stimulation, Spinal mobilization, Cryotherapy, Moist heat, and Manual therapy.  PLAN FOR NEXT SESSION:release R psoas, possible lumbar mobs, Work on lumbar mobility, LE flex/strength, progress to core strength as tolerated.   Morton Peters, PT 12/14/22 10:17 AM  [facility] [address], [address] [place], [place] [address] Phone # [phone] Fax [phone]

## 2022-12-18 NOTE — Therapy (Signed)
OUTPATIENT PHYSICAL THERAPY THORACOLUMBAR TREATMENT   Patient Name: Melissa Davenport MRN: [id number] DOB:08/06/1940, 82 y.o., female Today's Date: 12/19/2022  END OF SESSION:  PT End of Session - 12/19/22 1024     Visit Number 4    Date for PT Re-Evaluation 01/30/23    Authorization Type HTA    Progress Note Due on Visit 10    PT Start Time 1018    PT Stop Time 1058    PT Time Calculation (min) 40 min    Activity Tolerance Patient tolerated treatment well                Past Medical History:  Diagnosis Date   Allergic rhinitis    Allergy    allergic rhinitis   Basal cell carcinoma    Cancer (HCC)    Cataract    Complication of anesthesia    pt had nerve block in right shoulder for surgery and pt stated she could not breathe after that and bl/ increased , surgery was at outpatient surgery facilty done by DR Ave Filter   Diabetes mellitus without complication (HCC)    Glaucoma    Graves disease    History of kidney stones    Hyperlipidemia    Hypertension    Hypothyroidism    LVH (left ventricular hypertrophy) 2017   Mild to moderate   Meniere's disease    Mixed dyslipidemia    Osteopenia    Thyroid disease    Vitamin D deficiency    Past Surgical History:  Procedure Laterality Date   ABDOMINAL HYSTERECTOMY     CARDIAC CATHETERIZATION N/A 01/31/2016   Procedure: Left Heart Cath and Coronary Angiography;  Surgeon: Kathleene Hazel, MD;  Location: [facility] INVASIVE CV LAB;  Service: Cardiovascular;  Laterality: N/A;   CATARACT EXTRACTION Bilateral    COLONOSCOPY  2008 & 2014   CYSTOSCOPY/URETEROSCOPY/HOLMIUM LASER/STENT PLACEMENT Right 05/02/2022   Procedure: CYSTOSCOPY/URETEROSCOPY/HOLMIUM LASER/STENT PLACEMENT;  Surgeon: Bjorn Pippin, MD;  Location: [facility];  Service: Urology;  Laterality: Right;   CYSTOSCOPY/URETEROSCOPY/HOLMIUM LASER/STENT PLACEMENT Right 05/05/2022   Procedure: CYSTOSCOPY RIGHT URETEROSCOPY, HOLMIUM LASER STONE EXTRACTION AND STENT EXCHANGE;   Surgeon: Bjorn Pippin, MD;  Location: [facility];  Service: Urology;  Laterality: Right;   EYE SURGERY     FLEXIBLE SIGMOIDOSCOPY     IR URETERAL STENT RIGHT NEW ACCESS W/O SEP NEPHROSTOMY CATH  05/02/2022   left ankle surgery     left foot surgery     SPINE SURGERY     Patient Active Problem List   Diagnosis Date Noted   Renal stones 05/02/2022   Pyelonephritis 01/28/2022   Allergic rhinitis 09/22/2021   Body mass index (BMI) 34.0-34.9, adult 09/22/2021   Cardiomegaly 09/22/2021   Chronic sinusitis 09/22/2021   Decreased estrogen level 09/22/2021   Microalbuminuria 09/22/2021   Mixed hyperlipidemia 09/22/2021   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   Senile osteopenia 09/22/2021   Type 2 diabetes mellitus with other specified complication (HCC) 09/22/2021   Vitamin D deficiency 09/22/2021   Abnormal EKG    Precordial pain    Brow ptosis 12/24/2013   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 12/24/2013   Eyelid retraction 04/17/2013   Bulging eyes 04/17/2013   Disease of thyroid gland 10/09/2012   Glaucoma suspect 04/17/2012   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   Pseudoaphakia 04/17/2012   Glaucoma suspect of both eyes 04/17/2012   Blepharitis 03/28/2012   Meibomian gland disease 03/28/2012   Binocular vision disorder with diplopia 12/20/2011   Flajani disease  12/20/2011   BP (high blood pressure) 12/20/2011   Hypertropia 12/20/2011   Arthritis, degenerative 12/20/2011    PCP: Moshe Cipro, NP   REFERRING PROVIDER: Murlean Iba, NP   REFERRING DIAG: M51.36 (ICD-10-CM) - Other intervertebral disc degeneration, lumbar region   Rationale for Evaluation and Treatment: Rehabilitation  THERAPY DIAG:  Other low back pain  Cramp and spasm  Abnormal posture  ONSET DATE: 5 weeks ago  SUBJECTIVE:                                                                                                                                                                                            SUBJECTIVE STATEMENT: Feeling pain at top of R hip and into the back  PERTINENT HISTORY:  DM, HTN, spine surgery lumbar 50 years ago, kidney stones  PAIN:  Are you having pain? Yes: NPRS scale: 4/10 Pain location: Right low back and into front of hip Pain description: achy Aggravating factors: walking sitting in folding chair or firm chair Relieving factors: sitting and lying  PRECAUTIONS: None  WEIGHT BEARING RESTRICTIONS: No  FALLS:  Has patient fallen in last 6 months? No Had previous fall last year where she fell on her right side trying to kill a bug.  LIVING ENVIRONMENT: Lives with: lives alone Lives in: House/apartment Stairs: Yes: External: 4 steps; can reach both Has following equipment at home: Single point cane  OCCUPATION: retired  PLOF: Independent  PATIENT GOALS: get rid of all the pain  NEXT MD VISIT: October  OBJECTIVE:   DIAGNOSTIC FINDINGS:  Sacrum/Coccyx negative; Abdominal CT - neg  PATIENT SURVEYS:  FOTO 42 goal 59  SCREENING FOR RED FLAGS: Bowel or bladder incontinence: No Spinal tumors: No Cauda equina syndrome: No Compression fracture: No Abdominal aneurysm: No  COGNITION: Overall cognitive status: Within functional limits for tasks assessed     SENSATION: WFL  MUSCLE LENGTH: Tight R psoas, B HS and piriformis   POSTURE: forward head, decreased lumbar lordosis, and increased thoracic kyphosis  PALPATION: Right gluteals and psoas  LUMBAR ROM:   AROM eval  Flexion 40 pain and catch  Extension 10 pain  Right lateral flexion 15 pain  Left lateral flexion 23 pulling on R  Right rotation 50%  Left rotation 50%   (Blank rows = not tested)  LOWER EXTREMITY ROM:   WFL (see flexibility for tight areas)  LOWER EXTREMITY MMT:    MMT Right eval Left eval  Hip flexion 4+ 4  Hip extension 5 5  Hip abduction 4+ 4+  Hip adduction 4+ 5  Hip internal rotation    Hip external rotation  Knee flexion 5 5  Knee  extension 5 5  Ankle dorsiflexion 5 5  Ankle plantarflexion    Ankle inversion    Ankle eversion     (Blank rows = not tested)  LUMBAR SPECIAL TESTS:  Straight leg raise test: Negative and Slump test: Positive R  FUNCTIONAL TESTS:  5 times sit to stand: 19.31 sec  GAIT: Distance walked: 20 Assistive device utilized: None Level of assistance: Complete Independence Comments: antalgic  TODAY'S TREATMENT:                                                                                                                              DATE:   12/19/22 Standing Rt hip flexor stretch foot on 2nd step 2x20" Seated OH lift with ball for trunk ext x 10 Seated core series holding blue plyoball: hip to hip, hip to shoulder x 10 each Seated lumbar flexion 3-way ball rollouts x 8 each way Seated SB trunk A/ROM x 5 rounds each way Standing hip ABD, ext and march x 10 ea B Sidestepping yellow loop 5 steps x 4 reps, then with black loop x 4 reps Seated hip ABD black loop x 20 Standing resisted hip flexion yellow loop B x 10 (issued Green for HEP) Prone manual therapy: bil lumbar STM  Prone pelvic press x 5 with 5 sec hold, with knee bends unilaterally x 5 ea, with hip ext x 5 ea  12/14/22 Nustep L4 x 5 min PT present to assess status Standing Rt hip flexor stretch foot on 2nd step 2x20" Seated core series holding blue plyoball: hip to hip, hip to shoulder x 10 each Seated lumbar flexion 3-way ball rollouts x 8 each way Seated SB trunk A/ROM x 3 rounds each way Prone manual therapy: bil lumbar STM  Prone pelvic press x 5 with 5 sec hold, with knee bends unilaterally x 5 ea, with hip ext x 5 ea Supine Addaday assisted massage to Rt thigh and anterior hip, manual TPR Rt hip flexor  12/12/22 Nustep L4 x 5 min PT present to assess status Seated hip flexor stretch x 30 sec R, standing hip flex stretch at stairs x 30 sec R LTR 10 sec hold x 5 B Supine pf stretch with towel or strap (cross body) 2 x 30  sec B Bridge  2 x 10 Pelvic press x 5 with 5 sec hold, with knee bends unilaterally x 5 ea, with hip ext x 5 ea Seated marching x 5 ea with ab draw in Manual: TPR to R QL and psoas; gentle UPA mobs to L3/4-L5/S1 B, R long leg distraction   12/04/22  See pt ed and HEP  PATIENT EDUCATION:  Education details: HEP update  Person educated: Patient Education method: Explanation, Demonstration, Verbal cues, and Handouts Education comprehension: verbalized understanding and returned demonstration  HOME EXERCISE PROGRAM: Access Code: [email or web address] URL: [email or web address] Date: 12/19/2022 Prepared by: Raynelle Fanning  Exercises - Supine Lower Trunk Rotation  -  2 x daily - 7 x weekly - 1 sets - 5 reps - 10 sec hold - Supine Bridge  - 2 x daily - 7 x weekly - 1-3 sets - 10 reps - 3 -5 sec hold - Supine Piriformis Stretch with Foot on Ground  - 2 x daily - 7 x weekly - 1 sets - 3 reps - 30 sec hold - Seated March  - 1 x daily - 3-4 x weekly - 1 sets - 10 reps - Hip Flexor Stretch with Chair  - 2 x daily - 7 x weekly - 1 sets - 3 reps - 30 sec hold - Marching with Resistance  - 1 x daily - 3-4 x weekly - 1 sets - 10 reps - Prone Hip Extension with Pillow Under Abdomen  - 1 x daily - 3-4 x weekly - 1 sets - 10 reps  ASSESSMENT:  CLINICAL IMPRESSION: Kanisha reports no change in pain levels although pain is more at top of hip today then into hip flexor. She demonstrates increased tolerance to exercise without increased pain. Resisted hip flexion did not cause pain and HEP progressed with this. She continues to demonstrate potential for improvement and would benefit from continued skilled therapy to address impairments.     OBJECTIVE IMPAIRMENTS: Abnormal gait, decreased activity tolerance, decreased ROM, decreased strength, increased muscle spasms, impaired flexibility, postural dysfunction, obesity, and pain.   ACTIVITY LIMITATIONS: bending, sitting, standing, and locomotion  level  PARTICIPATION LIMITATIONS: cleaning, shopping, and community activity  PERSONAL FACTORS: Age, Fitness, and 3+ comorbidities: DM, HTN, previous back surgery, obesity  are also affecting patient's functional outcome.   REHAB POTENTIAL: Excellent  CLINICAL DECISION MAKING: Stable/uncomplicated  EVALUATION COMPLEXITY: Low   GOALS: Goals reviewed with patient? Yes  SHORT TERM GOALS: Target date: 12/26/2022   Patient will be independent with initial HEP.  Baseline:  Goal status: ongoing  2.  Patient will report centralization of pain to low back.  Baseline:  Goal status: INITIAL   LONG TERM GOALS: Target date: 01/30/2023   Patient will be independent with advanced/ongoing HEP to improve outcomes and carryover.  Baseline:  Goal status: INITIAL  2.  Patient will report > = 75% improvement in low back pain to improve QOL.  Baseline:  Goal status: INITIAL  3.  Patient will demonstrate unctional pain free lumbar ROM to perform ADLs.   Baseline:  Goal status: INITIAL  4.  Patient will demonstrate improved functional strength by decreasing 5XSTS by 5 sec or more  Baseline: 19 sec Goal status: INITIAL  5.  Patient will report 42 on lumbar FOTO to demonstrate improved functional ability.  Baseline: 42 Goal status: INITIAL   6.  Patient will tolerate 30 min of walking to perform normal ADLs and community activities. Baseline: 5-10 min Goal status: INITIAL    PLAN:  PT FREQUENCY: 2x/week  PT DURATION: 8 weeks  PLANNED INTERVENTIONS: Therapeutic exercises, Therapeutic activity, Neuromuscular re-education, Balance training, Gait training, Patient/Family education, Self Care, Joint mobilization, Stair training, Aquatic Therapy, Dry Needling, Electrical stimulation, Spinal mobilization, Cryotherapy, Moist heat, and Manual therapy.  PLAN FOR NEXT SESSION:release R psoas, possible lumbar mobs, Work on lumbar mobility, LE flex/strength, progress to core strength as  tolerated.   Solon Palm, PT 12/19/22 11:01 AM  [facility] [address] [place], [place] [address] Phone # [phone] Fax [phone]

## 2022-12-19 ENCOUNTER — Ambulatory Visit: Payer: PPO | Admitting: Physical Therapy

## 2022-12-19 ENCOUNTER — Encounter: Payer: Self-pay | Admitting: Physical Therapy

## 2022-12-19 DIAGNOSIS — R252 Cramp and spasm: Secondary | ICD-10-CM

## 2022-12-19 DIAGNOSIS — M5459 Other low back pain: Secondary | ICD-10-CM | POA: Diagnosis not present

## 2022-12-19 DIAGNOSIS — R293 Abnormal posture: Secondary | ICD-10-CM

## 2022-12-21 ENCOUNTER — Encounter: Payer: PPO | Admitting: Physical Therapy

## 2022-12-21 ENCOUNTER — Ambulatory Visit: Payer: PPO | Admitting: Podiatry

## 2022-12-21 ENCOUNTER — Ambulatory Visit: Payer: PPO | Admitting: Physical Therapy

## 2022-12-21 ENCOUNTER — Encounter: Payer: Self-pay | Admitting: Physical Therapy

## 2022-12-21 ENCOUNTER — Encounter: Payer: Self-pay | Admitting: Podiatry

## 2022-12-21 DIAGNOSIS — E119 Type 2 diabetes mellitus without complications: Secondary | ICD-10-CM

## 2022-12-21 DIAGNOSIS — D2372 Other benign neoplasm of skin of left lower limb, including hip: Secondary | ICD-10-CM

## 2022-12-21 DIAGNOSIS — D2371 Other benign neoplasm of skin of right lower limb, including hip: Secondary | ICD-10-CM

## 2022-12-21 DIAGNOSIS — M79676 Pain in unspecified toe(s): Secondary | ICD-10-CM

## 2022-12-21 DIAGNOSIS — M5459 Other low back pain: Secondary | ICD-10-CM | POA: Diagnosis not present

## 2022-12-21 DIAGNOSIS — R293 Abnormal posture: Secondary | ICD-10-CM

## 2022-12-21 DIAGNOSIS — R252 Cramp and spasm: Secondary | ICD-10-CM

## 2022-12-21 DIAGNOSIS — B351 Tinea unguium: Secondary | ICD-10-CM

## 2022-12-21 NOTE — Progress Notes (Signed)
She presents today chief complaint of painful elongated toenails and calluses plantar aspect of the foot.  Objective: Vital signs are stable she is alert and oriented x 3.  Pulses are palpable.  Neurologic sensorium is unchanged.  Toenails are long thick yellow dystrophic clinically mycotic benign skin lesions plantar aspect of the forefoot.  Assessment: Painful benign skin lesions with diabetes noncomplicated.  Painful elongated toenails.  Plan: Debridement of toenails and debridement of benign skin lesions.

## 2022-12-21 NOTE — Therapy (Signed)
OUTPATIENT PHYSICAL THERAPY THORACOLUMBAR TREATMENT   Patient Name: Melissa Davenport MRN: [id number] DOB:Aug 05, 1940, 82 y.o., female Today's Date: 12/21/2022  END OF SESSION:  PT End of Session - 12/21/22 1437     Visit Number 5    Date for PT Re-Evaluation 01/30/23    Authorization Type HTA    Progress Note Due on Visit 10    PT Start Time 1440    PT Stop Time 1529    PT Time Calculation (min) 49 min    Activity Tolerance Patient tolerated treatment well    Behavior During Therapy WFL for tasks assessed/performed                Past Medical History:  Diagnosis Date   Allergic rhinitis    Allergy    allergic rhinitis   Basal cell carcinoma    Cancer (HCC)    Cataract    Complication of anesthesia    pt had nerve block in right shoulder for surgery and pt stated she could not breathe after that and bl/ increased , surgery was at outpatient surgery facilty done by DR Ave Filter   Diabetes mellitus without complication (HCC)    Glaucoma    Graves disease    History of kidney stones    Hyperlipidemia    Hypertension    Hypothyroidism    LVH (left ventricular hypertrophy) 2017   Mild to moderate   Meniere's disease    Mixed dyslipidemia    Osteopenia    Thyroid disease    Vitamin D deficiency    Past Surgical History:  Procedure Laterality Date   ABDOMINAL HYSTERECTOMY     CARDIAC CATHETERIZATION N/A 01/31/2016   Procedure: Left Heart Cath and Coronary Angiography;  Surgeon: Kathleene Hazel, MD;  Location: [facility] INVASIVE CV LAB;  Service: Cardiovascular;  Laterality: N/A;   CATARACT EXTRACTION Bilateral    COLONOSCOPY  2008 & 2014   CYSTOSCOPY/URETEROSCOPY/HOLMIUM LASER/STENT PLACEMENT Right 05/02/2022   Procedure: CYSTOSCOPY/URETEROSCOPY/HOLMIUM LASER/STENT PLACEMENT;  Surgeon: Bjorn Pippin, MD;  Location: [facility];  Service: Urology;  Laterality: Right;   CYSTOSCOPY/URETEROSCOPY/HOLMIUM LASER/STENT PLACEMENT Right 05/05/2022   Procedure: CYSTOSCOPY RIGHT  URETEROSCOPY, HOLMIUM LASER STONE EXTRACTION AND STENT EXCHANGE;  Surgeon: Bjorn Pippin, MD;  Location: [facility];  Service: Urology;  Laterality: Right;   EYE SURGERY     FLEXIBLE SIGMOIDOSCOPY     IR URETERAL STENT RIGHT NEW ACCESS W/O SEP NEPHROSTOMY CATH  05/02/2022   left ankle surgery     left foot surgery     SPINE SURGERY     Patient Active Problem List   Diagnosis Date Noted   Renal stones 05/02/2022   Pyelonephritis 01/28/2022   Allergic rhinitis 09/22/2021   Body mass index (BMI) 34.0-34.9, adult 09/22/2021   Cardiomegaly 09/22/2021   Chronic sinusitis 09/22/2021   Decreased estrogen level 09/22/2021   Microalbuminuria 09/22/2021   Mixed hyperlipidemia 09/22/2021   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   Senile osteopenia 09/22/2021   Type 2 diabetes mellitus with other specified complication (HCC) 09/22/2021   Vitamin D deficiency 09/22/2021   Abnormal EKG    Precordial pain    Brow ptosis 12/24/2013   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 12/24/2013   Eyelid retraction 04/17/2013   Bulging eyes 04/17/2013   Disease of thyroid gland 10/09/2012   Glaucoma suspect 04/17/2012   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   Pseudoaphakia 04/17/2012   Glaucoma suspect of both eyes 04/17/2012   Blepharitis 03/28/2012   Meibomian gland disease 03/28/2012  Binocular vision disorder with diplopia 12/20/2011   Flajani disease 12/20/2011   BP (high blood pressure) 12/20/2011   Hypertropia 12/20/2011   Arthritis, degenerative 12/20/2011    PCP: Moshe Cipro, NP   REFERRING PROVIDER: Murlean Iba, NP   REFERRING DIAG: M51.36 (ICD-10-CM) - Other intervertebral disc degeneration, lumbar region   Rationale for Evaluation and Treatment: Rehabilitation  THERAPY DIAG:  Other low back pain  Cramp and spasm  Abnormal posture  ONSET DATE: 5 weeks ago  SUBJECTIVE:                                                                                                                                                                                            SUBJECTIVE STATEMENT: Patient reports pain is more in the front of the hip now versus the back.   PERTINENT HISTORY:  DM, HTN, spine surgery lumbar 50 years ago, kidney stones  PAIN:  Are you having pain? Yes: NPRS scale: 4/10 Pain location: Right ant hip Pain description: achy Aggravating factors: walking sitting in folding chair or firm chair Relieving factors: sitting and lying  PRECAUTIONS: None  WEIGHT BEARING RESTRICTIONS: No  FALLS:  Has patient fallen in last 6 months? No Had previous fall last year where she fell on her right side trying to kill a bug.  LIVING ENVIRONMENT: Lives with: lives alone Lives in: House/apartment Stairs: Yes: External: 4 steps; can reach both Has following equipment at home: Single point cane  OCCUPATION: retired  PLOF: Independent  PATIENT GOALS: get rid of all the pain  NEXT MD VISIT: October  OBJECTIVE:   DIAGNOSTIC FINDINGS:  Sacrum/Coccyx negative; Abdominal CT - neg  PATIENT SURVEYS:  FOTO 42 goal 59  SCREENING FOR RED FLAGS: Bowel or bladder incontinence: No Spinal tumors: No Cauda equina syndrome: No Compression fracture: No Abdominal aneurysm: No  COGNITION: Overall cognitive status: Within functional limits for tasks assessed     SENSATION: WFL  MUSCLE LENGTH: Tight R psoas, B HS and piriformis   POSTURE: forward head, decreased lumbar lordosis, and increased thoracic kyphosis  PALPATION: Right gluteals and psoas  LUMBAR ROM:   AROM eval  Flexion 40 pain and catch  Extension 10 pain  Right lateral flexion 15 pain  Left lateral flexion 23 pulling on R  Right rotation 50%  Left rotation 50%   (Blank rows = not tested)  LOWER EXTREMITY ROM:   WFL (see flexibility for tight areas)  LOWER EXTREMITY MMT:    MMT Right eval Left eval  Hip flexion 4+ 4  Hip extension 5 5  Hip abduction 4+ 4+  Hip adduction 4+ 5  Hip  internal rotation    Hip external rotation    Knee flexion 5 5  Knee extension 5 5  Ankle dorsiflexion 5 5  Ankle plantarflexion    Ankle inversion    Ankle eversion     (Blank rows = not tested)  LUMBAR SPECIAL TESTS:  Straight leg raise test: Negative and Slump test: Positive R  FUNCTIONAL TESTS:  5 times sit to stand: 19.31 sec  GAIT: Distance walked: 20 Assistive device utilized: None Level of assistance: Complete Independence Comments: antalgic  TODAY'S TREATMENT:                                                                                                                              DATE:   12/21/22 Nustep L3 x 5 Standing Rt hip flexor stretch foot on 2nd step 2x30" Manual: lumbar PA and UPA mobs to decrease muscle tension in back and hip flexors; STM and TPR to R gluteals, R lumbar and QL and R hip flexor IASTM to same LTR x 2  12/19/22 Standing Rt hip flexor stretch foot on 2nd step 2x20" Seated OH lift with ball for trunk ext x 10 Seated core series holding blue plyoball: hip to hip, hip to shoulder x 10 each Seated lumbar flexion 3-way ball rollouts x 8 each way Seated SB trunk A/ROM x 5 rounds each way Standing hip ABD, ext and march x 10 ea B Sidestepping yellow loop 5 steps x 4 reps, then with black loop x 4 reps Seated hip ABD black loop x 20 Standing resisted hip flexion yellow loop B x 10 (issued Green for HEP) Prone manual therapy: bil lumbar STM  Prone pelvic press x 5 with 5 sec hold, with knee bends unilaterally x 5 ea, with hip ext x 5 ea  12/14/22 Nustep L4 x 5 min PT present to assess status Standing Rt hip flexor stretch foot on 2nd step 2x20" Seated core series holding blue plyoball: hip to hip, hip to shoulder x 10 each Seated lumbar flexion 3-way ball rollouts x 8 each way Seated SB trunk A/ROM x 3 rounds each way Prone manual therapy: bil lumbar STM  Prone pelvic press x 5 with 5 sec hold, with knee bends unilaterally x 5 ea, with hip  ext x 5 ea Supine Addaday assisted massage to Rt thigh and anterior hip, manual TPR Rt hip flexor  12/12/22 Nustep L4 x 5 min PT present to assess status Seated hip flexor stretch x 30 sec R, standing hip flex stretch at stairs x 30 sec R LTR 10 sec hold x 5 B Supine pf stretch with towel or strap (cross body) 2 x 30 sec B Bridge  2 x 10 Pelvic press x 5 with 5 sec hold, with knee bends unilaterally x 5 ea, with hip ext x 5 ea Seated marching x 5 ea with ab draw in Manual: TPR to R QL and psoas; gentle UPA mobs to  L3/4-L5/S1 B, R long leg distraction   12/04/22  See pt ed and HEP  PATIENT EDUCATION:  Education details: HEP update  Person educated: Patient Education method: Explanation, Demonstration, Verbal cues, and Handouts Education comprehension: verbalized understanding and returned demonstration  HOME EXERCISE PROGRAM: Access Code: [email or web address] URL: [email or web address] Date: 12/19/2022 Prepared by: Raynelle Fanning  Exercises - Supine Lower Trunk Rotation  - 2 x daily - 7 x weekly - 1 sets - 5 reps - 10 sec hold - Supine Bridge  - 2 x daily - 7 x weekly - 1-3 sets - 10 reps - 3 -5 sec hold - Supine Piriformis Stretch with Foot on Ground  - 2 x daily - 7 x weekly - 1 sets - 3 reps - 30 sec hold - Seated March  - 1 x daily - 3-4 x weekly - 1 sets - 10 reps - Hip Flexor Stretch with Chair  - 2 x daily - 7 x weekly - 1 sets - 3 reps - 30 sec hold - Marching with Resistance  - 1 x daily - 3-4 x weekly - 1 sets - 10 reps - Prone Hip Extension with Pillow Under Abdomen  - 1 x daily - 3-4 x weekly - 1 sets - 10 reps  ASSESSMENT:  CLINICAL IMPRESSION: Katiria presents with ongoing right hip pain. She denies back pain today. We focused on MT to try to release her iliopsoas. Lumbar muscles were very tight as well, so these were addressed. At end of session, she had increased lumbar soreness but no hip pain. She does not have pain with resisted hip flexor exercises. Patient is hesitant  to try DN for fear of needles.    OBJECTIVE IMPAIRMENTS: Abnormal gait, decreased activity tolerance, decreased ROM, decreased strength, increased muscle spasms, impaired flexibility, postural dysfunction, obesity, and pain.   ACTIVITY LIMITATIONS: bending, sitting, standing, and locomotion level  PARTICIPATION LIMITATIONS: cleaning, shopping, and community activity  PERSONAL FACTORS: Age, Fitness, and 3+ comorbidities: DM, HTN, previous back surgery, obesity  are also affecting patient's functional outcome.   REHAB POTENTIAL: Excellent  CLINICAL DECISION MAKING: Stable/uncomplicated  EVALUATION COMPLEXITY: Low   GOALS: Goals reviewed with patient? Yes  SHORT TERM GOALS: Target date: 12/26/2022   Patient will be independent with initial HEP.  Baseline:  Goal status: ongoing  2.  Patient will report centralization of pain to low back.  Baseline:  Goal status: INITIAL   LONG TERM GOALS: Target date: 01/30/2023   Patient will be independent with advanced/ongoing HEP to improve outcomes and carryover.  Baseline:  Goal status: INITIAL  2.  Patient will report > = 75% improvement in low back pain to improve QOL.  Baseline:  Goal status: INITIAL  3.  Patient will demonstrate unctional pain free lumbar ROM to perform ADLs.   Baseline:  Goal status: INITIAL  4.  Patient will demonstrate improved functional strength by decreasing 5XSTS by 5 sec or more  Baseline: 19 sec Goal status: INITIAL  5.  Patient will report 61 on lumbar FOTO to demonstrate improved functional ability.  Baseline: 42 Goal status: INITIAL   6.  Patient will tolerate 30 min of walking to perform normal ADLs and community activities. Baseline: 5-10 min Goal status: INITIAL    PLAN:  PT FREQUENCY: 2x/week  PT DURATION: 8 weeks  PLANNED INTERVENTIONS: Therapeutic exercises, Therapeutic activity, Neuromuscular re-education, Balance training, Gait training, Patient/Family education, Self Care,  Joint mobilization, Stair training, Aquatic Therapy, Dry Needling, Electrical stimulation, Spinal mobilization, Cryotherapy, Moist  heat, and Manual therapy.  PLAN FOR NEXT SESSION:release R psoas, possible lumbar mobs, Work on lumbar mobility, LE flex/strength, progress to core strength as tolerated.   Solon Palm, PT 12/21/22 4:28 PM  [facility] [address], [address] [place], [place] [address] Phone # [phone] Fax [phone]

## 2022-12-25 NOTE — Therapy (Signed)
OUTPATIENT PHYSICAL THERAPY THORACOLUMBAR TREATMENT   Patient Name: Melissa Davenport MRN: [id number] DOB:1940-10-07, 82 y.o., female Today's Date: 12/26/2022  END OF SESSION:  PT End of Session - 12/26/22 0933     Visit Number 6    Date for PT Re-Evaluation 01/30/23    Authorization Type HTA    Progress Note Due on Visit 10    PT Start Time 0933    PT Stop Time 1015    PT Time Calculation (min) 42 min    Activity Tolerance Patient tolerated treatment well    Behavior During Therapy [facility] for tasks assessed/performed                 Past Medical History:  Diagnosis Date   Allergic rhinitis    Allergy    allergic rhinitis   Basal cell carcinoma    Cancer (HCC)    Cataract    Complication of anesthesia    pt had nerve block in right shoulder for surgery and pt stated she could not breathe after that and bl/ increased , surgery was at outpatient surgery facilty done by DR Ave Filter   Diabetes mellitus without complication (HCC)    Glaucoma    Graves disease    History of kidney stones    Hyperlipidemia    Hypertension    Hypothyroidism    LVH (left ventricular hypertrophy) 2017   Mild to moderate   Meniere's disease    Mixed dyslipidemia    Osteopenia    Thyroid disease    Vitamin D deficiency    Past Surgical History:  Procedure Laterality Date   ABDOMINAL HYSTERECTOMY     CARDIAC CATHETERIZATION N/A 01/31/2016   Procedure: Left Heart Cath and Coronary Angiography;  Surgeon: Kathleene Hazel, MD;  Location: [facility] INVASIVE CV LAB;  Service: Cardiovascular;  Laterality: N/A;   CATARACT EXTRACTION Bilateral    COLONOSCOPY  2008 & 2014   CYSTOSCOPY/URETEROSCOPY/HOLMIUM LASER/STENT PLACEMENT Right 05/02/2022   Procedure: CYSTOSCOPY/URETEROSCOPY/HOLMIUM LASER/STENT PLACEMENT;  Surgeon: Bjorn Pippin, MD;  Location: [facility];  Service: Urology;  Laterality: Right;   CYSTOSCOPY/URETEROSCOPY/HOLMIUM LASER/STENT PLACEMENT Right 05/05/2022   Procedure: CYSTOSCOPY RIGHT  URETEROSCOPY, HOLMIUM LASER STONE EXTRACTION AND STENT EXCHANGE;  Surgeon: Bjorn Pippin, MD;  Location: [facility];  Service: Urology;  Laterality: Right;   EYE SURGERY     FLEXIBLE SIGMOIDOSCOPY     IR URETERAL STENT RIGHT NEW ACCESS W/O SEP NEPHROSTOMY CATH  05/02/2022   left ankle surgery     left foot surgery     SPINE SURGERY     Patient Active Problem List   Diagnosis Date Noted   Renal stones 05/02/2022   Pyelonephritis 01/28/2022   Allergic rhinitis 09/22/2021   Body mass index (BMI) 34.0-34.9, adult 09/22/2021   Cardiomegaly 09/22/2021   Chronic sinusitis 09/22/2021   Decreased estrogen level 09/22/2021   Microalbuminuria 09/22/2021   Mixed hyperlipidemia 09/22/2021   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   Senile osteopenia 09/22/2021   Type 2 diabetes mellitus with other specified complication (HCC) 09/22/2021   Vitamin D deficiency 09/22/2021   Abnormal EKG    Precordial pain    Brow ptosis 12/24/2013   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 12/24/2013   Eyelid retraction 04/17/2013   Bulging eyes 04/17/2013   Disease of thyroid gland 10/09/2012   Glaucoma suspect 04/17/2012   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   Pseudoaphakia 04/17/2012   Glaucoma suspect of both eyes 04/17/2012   Blepharitis 03/28/2012   Meibomian gland disease 03/28/2012  Binocular vision disorder with diplopia 12/20/2011   Flajani disease 12/20/2011   BP (high blood pressure) 12/20/2011   Hypertropia 12/20/2011   Arthritis, degenerative 12/20/2011    PCP: Moshe Cipro, NP   REFERRING PROVIDER: Murlean Iba, NP   REFERRING DIAG: M51.36 (ICD-10-CM) - Other intervertebral disc degeneration, lumbar region   Rationale for Evaluation and Treatment: Rehabilitation  THERAPY DIAG:  Other low back pain  Cramp and spasm  Abnormal posture  ONSET DATE: 5 weeks ago  SUBJECTIVE:                                                                                                                                                                                            SUBJECTIVE STATEMENT: Patient reports 3 days of relief and then pain started to increase again. Today 1-2/10 in hip.   PERTINENT HISTORY:  DM, HTN, spine surgery lumbar 50 years ago, kidney stones  PAIN:  Are you having pain? Yes: NPRS scale: 4/10 Pain location: Right ant hip Pain description: achy Aggravating factors: walking sitting in folding chair or firm chair Relieving factors: sitting and lying  PRECAUTIONS: None  WEIGHT BEARING RESTRICTIONS: No  FALLS:  Has patient fallen in last 6 months? No Had previous fall last year where she fell on her right side trying to kill a bug.  LIVING ENVIRONMENT: Lives with: lives alone Lives in: House/apartment Stairs: Yes: External: 4 steps; can reach both Has following equipment at home: Single point cane  OCCUPATION: retired  PLOF: Independent  PATIENT GOALS: get rid of all the pain  NEXT MD VISIT: October  OBJECTIVE:   DIAGNOSTIC FINDINGS:  Sacrum/Coccyx negative; Abdominal CT - neg  PATIENT SURVEYS:  FOTO 42 goal 59  SCREENING FOR RED FLAGS: Bowel or bladder incontinence: No Spinal tumors: No Cauda equina syndrome: No Compression fracture: No Abdominal aneurysm: No  COGNITION: Overall cognitive status: Within functional limits for tasks assessed     SENSATION: WFL  MUSCLE LENGTH: Tight R psoas, B HS and piriformis   POSTURE: forward head, decreased lumbar lordosis, and increased thoracic kyphosis  PALPATION: Right gluteals and psoas  LUMBAR ROM:   AROM eval  Flexion 40 pain and catch  Extension 10 pain  Right lateral flexion 15 pain  Left lateral flexion 23 pulling on R  Right rotation 50%  Left rotation 50%   (Blank rows = not tested)  LOWER EXTREMITY ROM:   WFL (see flexibility for tight areas)  LOWER EXTREMITY MMT:    MMT Right eval Left eval  Hip flexion 4+ 4  Hip extension 5 5  Hip abduction 4+ 4+  Hip  adduction 4+ 5  Hip internal rotation    Hip external rotation    Knee flexion 5 5  Knee extension 5 5  Ankle dorsiflexion 5 5  Ankle plantarflexion    Ankle inversion    Ankle eversion     (Blank rows = not tested)  LUMBAR SPECIAL TESTS:  Straight leg raise test: Negative and Slump test: Positive R  FUNCTIONAL TESTS:  5 times sit to stand: 19.31 sec    12/26/22  12.5 sec one LOB   GAIT: Distance walked: 20 Assistive device utilized: None Level of assistance: Complete Independence Comments: antalgic  TODAY'S TREATMENT:                                                                                                                              DATE:    12/26/22 Nustep L5 x 5 Standing B hip flexor stretch foot on 8 inch 2x30" Standing hip flexor march 1 x10 yellow loop around feet, 1x10 blue loop for R side, 2x10 yellow L side Seated R hip flexion x 15 no resistance Step over yellow Teclor bar x 10 ea 2# R and 2.5# L - challenging 5xSTS Manual: CPA and UPA mobs to bil lumbar to decrease muscular tension, STM and IASTM with Attaday to R QL and gluteals  12/21/22 Nustep L3 x 5 Standing Rt hip flexor stretch foot on 2nd step 2x30" Manual: lumbar PA and UPA mobs to decrease muscle tension in back and hip flexors; STM and TPR to R gluteals, R lumbar and QL and R hip flexor IASTM to same LTR x 2  12/19/22 Standing Rt hip flexor stretch foot on 2nd step 2x20" Seated OH lift with ball for trunk ext x 10 Seated core series holding blue plyoball: hip to hip, hip to shoulder x 10 each Seated lumbar flexion 3-way ball rollouts x 8 each way Seated SB trunk A/ROM x 5 rounds each way Standing hip ABD, ext and march x 10 ea B Sidestepping yellow loop 5 steps x 4 reps, then with black loop x 4 reps Seated hip ABD black loop x 20 Standing resisted hip flexion yellow loop B x 10 (issued Green for HEP) Prone manual therapy: bil lumbar STM  Prone pelvic press x 5 with 5 sec hold, with knee  bends unilaterally x 5 ea, with hip ext x 5 ea  12/14/22 Nustep L4 x 5 min PT present to assess status Standing Rt hip flexor stretch foot on 2nd step 2x20" Seated core series holding blue plyoball: hip to hip, hip to shoulder x 10 each Seated lumbar flexion 3-way ball rollouts x 8 each way Seated SB trunk A/ROM x 3 rounds each way Prone manual therapy: bil lumbar STM  Prone pelvic press x 5 with 5 sec hold, with knee bends unilaterally x 5 ea, with hip ext x 5 ea Supine Addaday assisted massage to Rt thigh and anterior hip, manual TPR Rt hip flexor  12/12/22 Nustep L4 x 5 min PT present to assess status Seated hip flexor stretch x 30 sec R, standing hip flex stretch at stairs x 30 sec R LTR 10 sec hold x 5 B Supine pf stretch with towel or strap (cross body) 2 x 30 sec B Bridge  2 x 10 Pelvic press x 5 with 5 sec hold, with knee bends unilaterally x 5 ea, with hip ext x 5 ea Seated marching x 5 ea with ab draw in Manual: TPR to R QL and psoas; gentle UPA mobs to L3/4-L5/S1 B, R long leg distraction   12/04/22  See pt ed and HEP  PATIENT EDUCATION:  Education details: HEP update  Person educated: Patient Education method: Explanation, Demonstration, Verbal cues, and Handouts Education comprehension: verbalized understanding and returned demonstration  HOME EXERCISE PROGRAM: Access Code: [email or web address] URL: [email or web address] Date: 12/19/2022 Prepared by: Raynelle Fanning  Exercises - Supine Lower Trunk Rotation  - 2 x daily - 7 x weekly - 1 sets - 5 reps - 10 sec hold - Supine Bridge  - 2 x daily - 7 x weekly - 1-3 sets - 10 reps - 3 -5 sec hold - Supine Piriformis Stretch with Foot on Ground  - 2 x daily - 7 x weekly - 1 sets - 3 reps - 30 sec hold - Seated March  - 1 x daily - 3-4 x weekly - 1 sets - 10 reps - Hip Flexor Stretch with Chair  - 2 x daily - 7 x weekly - 1 sets - 3 reps - 30 sec hold - Marching with Resistance  - 1 x daily - 3-4 x weekly - 1 sets - 10 reps -  Prone Hip Extension with Pillow Under Abdomen  - 1 x daily - 3-4 x weekly - 1 sets - 10 reps  ASSESSMENT:  CLINICAL IMPRESSION: Shenika responded very well to manual therapy at last visit with 2-3 "almost" pain free days after last session. She continues to have tightness and weakness in the R iliopsoas and tightness in bil lumbar, so we kept focus on this today. Her 5xSTS has improved significantly from eval, meeting her LTG, however she did demonstrate one incidence of LOB on 4th rep. Zuria continues to demonstrate potential for improvement and would benefit from continued skilled therapy to address impairments.    OBJECTIVE IMPAIRMENTS: Abnormal gait, decreased activity tolerance, decreased ROM, decreased strength, increased muscle spasms, impaired flexibility, postural dysfunction, obesity, and pain.   ACTIVITY LIMITATIONS: bending, sitting, standing, and locomotion level  PARTICIPATION LIMITATIONS: cleaning, shopping, and community activity  PERSONAL FACTORS: Age, Fitness, and 3+ comorbidities: DM, HTN, previous back surgery, obesity  are also affecting patient's functional outcome.   REHAB POTENTIAL: Excellent  CLINICAL DECISION MAKING: Stable/uncomplicated  EVALUATION COMPLEXITY: Low   GOALS: Goals reviewed with patient? Yes  SHORT TERM GOALS: Target date: 12/26/2022   Patient will be independent with initial HEP.  Baseline:  Goal status: ongoing  2.  Patient will report centralization of pain to low back.  Baseline:  Goal status: IN PROGRESS   LONG TERM GOALS: Target date: 01/30/2023   Patient will be independent with advanced/ongoing HEP to improve outcomes and carryover.  Baseline:  Goal status: INITIAL  2.  Patient will report > = 75% improvement in low back pain to improve QOL.  Baseline:  Goal status: INITIAL  3.  Patient will demonstrate functional pain free lumbar ROM to perform ADLs.   Baseline:  Goal status: INITIAL  4.  Patient will demonstrate  improved functional strength by decreasing 5XSTS by 5 sec or more  Baseline: 19 sec Goal status: MET  5.  Patient will report 22 on lumbar FOTO to demonstrate improved functional ability.  Baseline: 42 Goal status: INITIAL   6.  Patient will tolerate 30 min of walking to perform normal ADLs and community activities. Baseline: 5-10 min Goal status: INITIAL    PLAN:  PT FREQUENCY: 2x/week  PT DURATION: 8 weeks  PLANNED INTERVENTIONS: Therapeutic exercises, Therapeutic activity, Neuromuscular re-education, Balance training, Gait training, Patient/Family education, Self Care, Joint mobilization, Stair training, Aquatic Therapy, Dry Needling, Electrical stimulation, Spinal mobilization, Cryotherapy, Moist heat, and Manual therapy.  PLAN FOR NEXT SESSION: continue to release and strengthen R psoas, lumbar mobs and QL release, Work on lumbar mobility, LE flex/strength, progress to core strength as tolerated.   Solon Palm, PT 12/26/22 10:29 AM [facility] [address], [address] [place], [place] [address] Phone # [phone] Fax [phone]

## 2022-12-26 ENCOUNTER — Ambulatory Visit: Payer: PPO | Admitting: Physical Therapy

## 2022-12-26 ENCOUNTER — Encounter: Payer: Self-pay | Admitting: Physical Therapy

## 2022-12-26 DIAGNOSIS — M5459 Other low back pain: Secondary | ICD-10-CM | POA: Diagnosis not present

## 2022-12-26 DIAGNOSIS — R293 Abnormal posture: Secondary | ICD-10-CM

## 2022-12-26 DIAGNOSIS — R252 Cramp and spasm: Secondary | ICD-10-CM

## 2022-12-28 ENCOUNTER — Ambulatory Visit: Payer: PPO | Admitting: Physical Therapy

## 2022-12-28 ENCOUNTER — Encounter: Payer: Self-pay | Admitting: Physical Therapy

## 2022-12-28 DIAGNOSIS — M5459 Other low back pain: Secondary | ICD-10-CM | POA: Diagnosis not present

## 2022-12-28 DIAGNOSIS — R252 Cramp and spasm: Secondary | ICD-10-CM

## 2022-12-28 DIAGNOSIS — R293 Abnormal posture: Secondary | ICD-10-CM

## 2022-12-28 NOTE — Therapy (Signed)
OUTPATIENT PHYSICAL THERAPY THORACOLUMBAR TREATMENT   Patient Name: Melissa Davenport MRN: [id number] DOB:04/25/1941, 82 y.o., female Today's Date: 12/28/2022  END OF SESSION:  PT End of Session - 12/28/22 1234     Visit Number 7    Date for PT Re-Evaluation 01/30/23    Authorization Type HTA    Progress Note Due on Visit 10    PT Start Time 1230    PT Stop Time 1315    PT Time Calculation (min) 45 min    Activity Tolerance Patient tolerated treatment well    Behavior During Therapy WFL for tasks assessed/performed                  Past Medical History:  Diagnosis Date   Allergic rhinitis    Allergy    allergic rhinitis   Basal cell carcinoma    Cancer (HCC)    Cataract    Complication of anesthesia    pt had nerve block in right shoulder for surgery and pt stated she could not breathe after that and bl/ increased , surgery was at outpatient surgery facilty done by DR Ave Filter   Diabetes mellitus without complication (HCC)    Glaucoma    Graves disease    History of kidney stones    Hyperlipidemia    Hypertension    Hypothyroidism    LVH (left ventricular hypertrophy) 2017   Mild to moderate   Meniere's disease    Mixed dyslipidemia    Osteopenia    Thyroid disease    Vitamin D deficiency    Past Surgical History:  Procedure Laterality Date   ABDOMINAL HYSTERECTOMY     CARDIAC CATHETERIZATION N/A 01/31/2016   Procedure: Left Heart Cath and Coronary Angiography;  Surgeon: Kathleene Hazel, MD;  Location: [facility] INVASIVE CV LAB;  Service: Cardiovascular;  Laterality: N/A;   CATARACT EXTRACTION Bilateral    COLONOSCOPY  2008 & 2014   CYSTOSCOPY/URETEROSCOPY/HOLMIUM LASER/STENT PLACEMENT Right 05/02/2022   Procedure: CYSTOSCOPY/URETEROSCOPY/HOLMIUM LASER/STENT PLACEMENT;  Surgeon: Bjorn Pippin, MD;  Location: [facility];  Service: Urology;  Laterality: Right;   CYSTOSCOPY/URETEROSCOPY/HOLMIUM LASER/STENT PLACEMENT Right 05/05/2022   Procedure: CYSTOSCOPY RIGHT  URETEROSCOPY, HOLMIUM LASER STONE EXTRACTION AND STENT EXCHANGE;  Surgeon: Bjorn Pippin, MD;  Location: [facility];  Service: Urology;  Laterality: Right;   EYE SURGERY     FLEXIBLE SIGMOIDOSCOPY     IR URETERAL STENT RIGHT NEW ACCESS W/O SEP NEPHROSTOMY CATH  05/02/2022   left ankle surgery     left foot surgery     SPINE SURGERY     Patient Active Problem List   Diagnosis Date Noted   Renal stones 05/02/2022   Pyelonephritis 01/28/2022   Allergic rhinitis 09/22/2021   Body mass index (BMI) 34.0-34.9, adult 09/22/2021   Cardiomegaly 09/22/2021   Chronic sinusitis 09/22/2021   Decreased estrogen level 09/22/2021   Microalbuminuria 09/22/2021   Mixed hyperlipidemia 09/22/2021   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   Senile osteopenia 09/22/2021   Type 2 diabetes mellitus with other specified complication (HCC) 09/22/2021   Vitamin D deficiency 09/22/2021   Abnormal EKG    Precordial pain    Brow ptosis 12/24/2013   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 12/24/2013   Eyelid retraction 04/17/2013   Bulging eyes 04/17/2013   Disease of thyroid gland 10/09/2012   Glaucoma suspect 04/17/2012   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   Pseudoaphakia 04/17/2012   Glaucoma suspect of both eyes 04/17/2012   Blepharitis 03/28/2012   Meibomian gland disease 03/28/2012  Binocular vision disorder with diplopia 12/20/2011   Flajani disease 12/20/2011   BP (high blood pressure) 12/20/2011   Hypertropia 12/20/2011   Arthritis, degenerative 12/20/2011    PCP: Moshe Cipro, NP   REFERRING PROVIDER: Murlean Iba, NP   REFERRING DIAG: M51.36 (ICD-10-CM) - Other intervertebral disc degeneration, lumbar region   Rationale for Evaluation and Treatment: Rehabilitation  THERAPY DIAG:  Other low back pain  Abnormal posture  Cramp and spasm  ONSET DATE: 5 weeks ago  SUBJECTIVE:                                                                                                                                                                                            SUBJECTIVE STATEMENT: I'm getting better.  Pain is only about a 1/10.    PERTINENT HISTORY:  DM, HTN, spine surgery lumbar 50 years ago, kidney stones  PAIN:  Are you having pain? Yes: NPRS scale: 1/10 Pain location: Right ant hip Pain description: achy Aggravating factors: walking sitting in folding chair or firm chair Relieving factors: sitting and lying  PRECAUTIONS: None  WEIGHT BEARING RESTRICTIONS: No  FALLS:  Has patient fallen in last 6 months? No Had previous fall last year where she fell on her right side trying to kill a bug.  LIVING ENVIRONMENT: Lives with: lives alone Lives in: House/apartment Stairs: Yes: External: 4 steps; can reach both Has following equipment at home: Single point cane  OCCUPATION: retired  PLOF: Independent  PATIENT GOALS: get rid of all the pain  NEXT MD VISIT: October  OBJECTIVE:   DIAGNOSTIC FINDINGS:  Sacrum/Coccyx negative; Abdominal CT - neg  PATIENT SURVEYS:  FOTO 42 goal 59  SCREENING FOR RED FLAGS: Bowel or bladder incontinence: No Spinal tumors: No Cauda equina syndrome: No Compression fracture: No Abdominal aneurysm: No  COGNITION: Overall cognitive status: Within functional limits for tasks assessed     SENSATION: WFL  MUSCLE LENGTH: Tight R psoas, B HS and piriformis   POSTURE: forward head, decreased lumbar lordosis, and increased thoracic kyphosis  PALPATION: Right gluteals and psoas  LUMBAR ROM:   AROM eval  Flexion 40 pain and catch  Extension 10 pain  Right lateral flexion 15 pain  Left lateral flexion 23 pulling on R  Right rotation 50%  Left rotation 50%   (Blank rows = not tested)  LOWER EXTREMITY ROM:   WFL (see flexibility for tight areas)  LOWER EXTREMITY MMT:    MMT Right eval Left eval  Hip flexion 4+ 4  Hip extension 5 5  Hip abduction 4+ 4+  Hip adduction 4+ 5  Hip internal rotation  Hip external rotation    Knee flexion 5 5  Knee extension 5 5  Ankle dorsiflexion 5 5  Ankle plantarflexion    Ankle inversion    Ankle eversion     (Blank rows = not tested)  LUMBAR SPECIAL TESTS:  Straight leg raise test: Negative and Slump test: Positive R  FUNCTIONAL TESTS:  5 times sit to stand: 19.31 sec    12/26/22  12.5 sec one LOB   GAIT: Distance walked: 20 Assistive device utilized: None Level of assistance: Complete Independence Comments: antalgic  TODAY'S TREATMENT:                                                                                                                              DATE:   12/28/22: Nustep L5 x 5 Standing bil hip flexor stretch foot on 2nd step 2x20" Yellow loop standing hip flexor march 1x10 bil Yellow loop at ankles march and sidestep over and back across low disc x 10 each LE Seated hip ABD black loop x 20 Seated core series holding blue plyoball: hip to hip, hip to shoulder x 10 each Sit to stand from chair 2x5 Prone UPA along Rt lumbar spine, TP release manually and Addaday assisted STM to lateral aspect of Rt gluteals and proximal quad   12/26/22 Nustep L5 x 5 Standing B hip flexor stretch foot on 8 inch 2x30" Standing hip flexor march 1 x10 yellow loop around feet, 1x10 blue loop for R side, 2x10 yellow L side Seated R hip flexion x 15 no resistance Step over yellow Teclor bar x 10 ea 2# R and 2.5# L - challenging 5xSTS Manual: CPA and UPA mobs to bil lumbar to decrease muscular tension, STM and IASTM with Attaday to R QL and gluteals  12/21/22 Nustep L3 x 5 Standing Rt hip flexor stretch foot on 2nd step 2x30" Manual: lumbar PA and UPA mobs to decrease muscle tension in back and hip flexors; STM and TPR to R gluteals, R lumbar and QL and R hip flexor IASTM to same LTR x 2   PATIENT EDUCATION:  Education details: HEP update  Person educated: Patient Education method: Explanation, Demonstration, Verbal cues, and  Handouts Education comprehension: verbalized understanding and returned demonstration  HOME EXERCISE PROGRAM: Access Code: [email or web address] URL: [email or web address] Date: 12/19/2022 Prepared by: Raynelle Fanning  Exercises - Supine Lower Trunk Rotation  - 2 x daily - 7 x weekly - 1 sets - 5 reps - 10 sec hold - Supine Bridge  - 2 x daily - 7 x weekly - 1-3 sets - 10 reps - 3 -5 sec hold - Supine Piriformis Stretch with Foot on Ground  - 2 x daily - 7 x weekly - 1 sets - 3 reps - 30 sec hold - Seated March  - 1 x daily - 3-4 x weekly - 1 sets - 10 reps - Hip Flexor Stretch with Chair  - 2 x daily - 7 x  weekly - 1 sets - 3 reps - 30 sec hold - Marching with Resistance  - 1 x daily - 3-4 x weekly - 1 sets - 10 reps - Prone Hip Extension with Pillow Under Abdomen  - 1 x daily - 3-4 x weekly - 1 sets - 10 reps  ASSESSMENT:  CLINICAL IMPRESSION: Gabrella continues to have good relief and carry over between sessions, rating pain as 1/10 today.  Her pain is more anterior and lateral aspect of hip vs lumbar at this time.  Her lumbar spine and lumbar soft tissues are much improved for mobility and extensibility.  She continues to have tightness and weakness in the R iliopsoas.  She has tenderness along lateral gluteals on Rt and proximal quads.  Shana continues to demonstrate potential for improvement and would benefit from continued skilled therapy to address impairments.    OBJECTIVE IMPAIRMENTS: Abnormal gait, decreased activity tolerance, decreased ROM, decreased strength, increased muscle spasms, impaired flexibility, postural dysfunction, obesity, and pain.   ACTIVITY LIMITATIONS: bending, sitting, standing, and locomotion level  PARTICIPATION LIMITATIONS: cleaning, shopping, and community activity  PERSONAL FACTORS: Age, Fitness, and 3+ comorbidities: DM, HTN, previous back surgery, obesity  are also affecting patient's functional outcome.   REHAB POTENTIAL: Excellent  CLINICAL DECISION  MAKING: Stable/uncomplicated  EVALUATION COMPLEXITY: Low   GOALS: Goals reviewed with patient? Yes  SHORT TERM GOALS: Target date: 12/26/2022   Patient will be independent with initial HEP.  Baseline:  Goal status: met  2.  Patient will report centralization of pain to low back.  Baseline:  Goal status: met   LONG TERM GOALS: Target date: 01/30/2023   Patient will be independent with advanced/ongoing HEP to improve outcomes and carryover.  Baseline:  Goal status: ongoing  2.  Patient will report > = 75% improvement in low back pain to improve QOL.  Baseline:  Goal status: ongoing  3.  Patient will demonstrate functional pain free lumbar ROM to perform ADLs.   Baseline:  Goal status: ongoing  4.  Patient will demonstrate improved functional strength by decreasing 5XSTS by 5 sec or more  Baseline: 19 sec Goal status: MET  5.  Patient will report 21 on lumbar FOTO to demonstrate improved functional ability.  Baseline: 42 Goal status: INITIAL   6.  Patient will tolerate 30 min of walking to perform normal ADLs and community activities. Baseline: 5-10 min Goal status: INITIAL    PLAN:  PT FREQUENCY: 2x/week  PT DURATION: 8 weeks  PLANNED INTERVENTIONS: Therapeutic exercises, Therapeutic activity, Neuromuscular re-education, Balance training, Gait training, Patient/Family education, Self Care, Joint mobilization, Stair training, Aquatic Therapy, Dry Needling, Electrical stimulation, Spinal mobilization, Cryotherapy, Moist heat, and Manual therapy.  PLAN FOR NEXT SESSION: continue to release and strengthen R psoas, lumbar mobs and QL release, Work on lumbar mobility, LE flex/strength, progress to core strength as tolerated.   Morton Peters, PT 12/28/22 1:28 PM  [facility] [address] [place], [place] [address] Phone # [phone] Fax [phone]

## 2023-01-04 ENCOUNTER — Ambulatory Visit: Payer: PPO | Attending: Pain Medicine | Admitting: Physical Therapy

## 2023-01-04 DIAGNOSIS — R252 Cramp and spasm: Secondary | ICD-10-CM | POA: Insufficient documentation

## 2023-01-04 DIAGNOSIS — M5459 Other low back pain: Secondary | ICD-10-CM | POA: Insufficient documentation

## 2023-01-04 DIAGNOSIS — R293 Abnormal posture: Secondary | ICD-10-CM | POA: Insufficient documentation

## 2023-01-08 ENCOUNTER — Ambulatory Visit: Payer: PPO | Admitting: Physical Therapy

## 2023-01-09 DIAGNOSIS — J3489 Other specified disorders of nose and nasal sinuses: Secondary | ICD-10-CM | POA: Diagnosis not present

## 2023-01-09 DIAGNOSIS — R350 Frequency of micturition: Secondary | ICD-10-CM | POA: Diagnosis not present

## 2023-01-11 ENCOUNTER — Ambulatory Visit: Payer: PPO | Admitting: Physical Therapy

## 2023-01-16 ENCOUNTER — Ambulatory Visit: Payer: PPO | Admitting: Physical Therapy

## 2023-01-18 ENCOUNTER — Ambulatory Visit: Payer: PPO | Admitting: Physical Therapy

## 2023-01-25 ENCOUNTER — Ambulatory Visit: Payer: PPO | Admitting: Physical Therapy

## 2023-01-25 DIAGNOSIS — M5459 Other low back pain: Secondary | ICD-10-CM | POA: Diagnosis not present

## 2023-01-25 DIAGNOSIS — R252 Cramp and spasm: Secondary | ICD-10-CM | POA: Diagnosis not present

## 2023-01-25 DIAGNOSIS — R293 Abnormal posture: Secondary | ICD-10-CM

## 2023-01-25 NOTE — Therapy (Signed)
OUTPATIENT PHYSICAL THERAPY THORACOLUMBAR TREATMENT   Patient Name: Melissa Davenport MRN: [id number] DOB:May 17, 1941, 82 y.o., female Today's Date: 01/25/2023  END OF SESSION:  PT End of Session - 01/25/23 1145     Visit Number 8    Date for PT Re-Evaluation 01/30/23    Authorization Type HTA    Progress Note Due on Visit 10    PT Start Time 1145    PT Stop Time 1225    PT Time Calculation (min) 40 min    Activity Tolerance Patient tolerated treatment well                   Past Medical History:  Diagnosis Date   Allergic rhinitis    Allergy    allergic rhinitis   Basal cell carcinoma    Cancer (HCC)    Cataract    Complication of anesthesia    pt had nerve block in right shoulder for surgery and pt stated she could not breathe after that and bl/ increased , surgery was at outpatient surgery facilty done by DR Ave Filter   Diabetes mellitus without complication (HCC)    Glaucoma    Graves disease    History of kidney stones    Hyperlipidemia    Hypertension    Hypothyroidism    LVH (left ventricular hypertrophy) 2017   Mild to moderate   Meniere's disease    Mixed dyslipidemia    Osteopenia    Thyroid disease    Vitamin D deficiency    Past Surgical History:  Procedure Laterality Date   ABDOMINAL HYSTERECTOMY     CARDIAC CATHETERIZATION N/A 01/31/2016   Procedure: Left Heart Cath and Coronary Angiography;  Surgeon: Kathleene Hazel, MD;  Location: [facility] INVASIVE CV LAB;  Service: Cardiovascular;  Laterality: N/A;   CATARACT EXTRACTION Bilateral    COLONOSCOPY  2008 & 2014   CYSTOSCOPY/URETEROSCOPY/HOLMIUM LASER/STENT PLACEMENT Right 05/02/2022   Procedure: CYSTOSCOPY/URETEROSCOPY/HOLMIUM LASER/STENT PLACEMENT;  Surgeon: Bjorn Pippin, MD;  Location: [facility];  Service: Urology;  Laterality: Right;   CYSTOSCOPY/URETEROSCOPY/HOLMIUM LASER/STENT PLACEMENT Right 05/05/2022   Procedure: CYSTOSCOPY RIGHT URETEROSCOPY, HOLMIUM LASER STONE EXTRACTION AND STENT  EXCHANGE;  Surgeon: Bjorn Pippin, MD;  Location: [facility];  Service: Urology;  Laterality: Right;   EYE SURGERY     FLEXIBLE SIGMOIDOSCOPY     IR URETERAL STENT RIGHT NEW ACCESS W/O SEP NEPHROSTOMY CATH  05/02/2022   left ankle surgery     left foot surgery     SPINE SURGERY     Patient Active Problem List   Diagnosis Date Noted   Renal stones 05/02/2022   Pyelonephritis 01/28/2022   Allergic rhinitis 09/22/2021   Body mass index (BMI) 34.0-34.9, adult 09/22/2021   Cardiomegaly 09/22/2021   Chronic sinusitis 09/22/2021   Decreased estrogen level 09/22/2021   Microalbuminuria 09/22/2021   Mixed hyperlipidemia 09/22/2021   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   Senile osteopenia 09/22/2021   Type 2 diabetes mellitus with other specified complication (HCC) 09/22/2021   Vitamin D deficiency 09/22/2021   Abnormal EKG    Precordial pain    Brow ptosis 12/24/2013   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 12/24/2013   Eyelid retraction 04/17/2013   Bulging eyes 04/17/2013   Disease of thyroid gland 10/09/2012   Glaucoma suspect 04/17/2012   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   Pseudoaphakia 04/17/2012   Glaucoma suspect of both eyes 04/17/2012   Blepharitis 03/28/2012   Meibomian gland disease 03/28/2012   Binocular vision disorder with diplopia 12/20/2011  Flajani disease 12/20/2011   BP (high blood pressure) 12/20/2011   Hypertropia 12/20/2011   Arthritis, degenerative 12/20/2011    PCP: Moshe Cipro, NP   REFERRING PROVIDER: Murlean Iba, NP   REFERRING DIAG: M51.36 (ICD-10-CM) - Other intervertebral disc degeneration, lumbar region   Rationale for Evaluation and Treatment: Rehabilitation  THERAPY DIAG:  Other low back pain  Abnormal posture  Cramp and spasm  ONSET DATE: 5 weeks ago  SUBJECTIVE:                                                                                                                                                                                            SUBJECTIVE STATEMENT: I'm doing a lot better.  Only 1/10 pain level.  PERTINENT HISTORY:  DM, HTN, spine surgery lumbar 50 years ago, kidney stones  PAIN:  Are you having pain? Yes: NPRS scale: 1/10 Pain location: Right ant hip Pain description: achy Aggravating factors: walking sitting in folding chair or firm chair Relieving factors: sitting and lying  PRECAUTIONS: None  WEIGHT BEARING RESTRICTIONS: No  FALLS:  Has patient fallen in last 6 months? No Had previous fall last year where she fell on her right side trying to kill a bug.  LIVING ENVIRONMENT: Lives with: lives alone Lives in: House/apartment Stairs: Yes: External: 4 steps; can reach both Has following equipment at home: Single point cane  OCCUPATION: retired  PLOF: Independent  PATIENT GOALS: get rid of all the pain  NEXT MD VISIT: October  OBJECTIVE:   DIAGNOSTIC FINDINGS:  Sacrum/Coccyx negative; Abdominal CT - neg  PATIENT SURVEYS:  FOTO 42 goal 59  SCREENING FOR RED FLAGS: Bowel or bladder incontinence: No Spinal tumors: No Cauda equina syndrome: No Compression fracture: No Abdominal aneurysm: No  COGNITION: Overall cognitive status: Within functional limits for tasks assessed     SENSATION: WFL  MUSCLE LENGTH: Tight R psoas, B HS and piriformis   POSTURE: forward head, decreased lumbar lordosis, and increased thoracic kyphosis  PALPATION: Right gluteals and psoas  LUMBAR ROM:   AROM eval  Flexion 40 pain and catch  Extension 10 pain  Right lateral flexion 15 pain  Left lateral flexion 23 pulling on R  Right rotation 50%  Left rotation 50%   (Blank rows = not tested)  LOWER EXTREMITY ROM:   WFL (see flexibility for tight areas)  LOWER EXTREMITY MMT:    MMT Right eval Left eval  Hip flexion 4+ 4  Hip extension 5 5  Hip abduction 4+ 4+  Hip adduction 4+ 5  Hip internal rotation    Hip external rotation    Knee  flexion 5 5  Knee extension 5  5  Ankle dorsiflexion 5 5  Ankle plantarflexion    Ankle inversion    Ankle eversion     (Blank rows = not tested)  LUMBAR SPECIAL TESTS:  Straight leg raise test: Negative and Slump test: Positive R  FUNCTIONAL TESTS:  5 times sit to stand: 19.31 sec    12/26/22  12.5 sec one LOB   GAIT: Distance walked: 20 Assistive device utilized: None Level of assistance: Complete Independence Comments: antalgic  TODAY'S TREATMENT:                                                                                                                              DATE:   01/08/23: Nustep L3 (blue model) x 10 min  Standing bil hip flexor stretch foot on 2nd step dynamically 10x right/left Standing HS stretch at the 2nd step 10x right/left Yellow loop standing hip flexor march 1x10 bil Yellow loop around feet march 10x and sidestep for the length of the counter 4 laps  Seated hip ABD black loop x 20 Seated core series holding blue plyoball: reach forward and then horizontal abduction  x 10 each (modified pt states hip to shoulder is painful on her rotator cuff but reaches forward and horizontal abd OK) Sit to stand from chair 2x5 hands resting on thighs Seated trunk extension/bil row with cable pulleys 15# 20x  12/28/22: Nustep L5 x 5 Standing bil hip flexor stretch foot on 2nd step 2x20" Yellow loop standing hip flexor march 1x10 bil Yellow loop at ankles march and sidestep over and back across low disc x 10 each LE Seated hip ABD black loop x 20 Seated core series holding blue plyoball: hip to hip, hip to shoulder x 10 each Sit to stand from chair 2x5 Prone UPA along Rt lumbar spine, TP release manually and Addaday assisted STM to lateral aspect of Rt gluteals and proximal quad   12/26/22 Nustep L5 x 5 Standing B hip flexor stretch foot on 8 inch 2x30" Standing hip flexor march 1 x10 yellow loop around feet, 1x10 blue loop for R side, 2x10 yellow L side Seated R hip flexion x 15 no  resistance Step over yellow Teclor bar x 10 ea 2# R and 2.5# L - challenging 5xSTS Manual: CPA and UPA mobs to bil lumbar to decrease muscular tension, STM and IASTM with Attaday to R QL and gluteals  12/21/22 Nustep L3 x 5 Standing Rt hip flexor stretch foot on 2nd step 2x30" Manual: lumbar PA and UPA mobs to decrease muscle tension in back and hip flexors; STM and TPR to R gluteals, R lumbar and QL and R hip flexor IASTM to same LTR x 2   PATIENT EDUCATION:  Education details: HEP update  Person educated: Patient Education method: Explanation, Demonstration, Verbal cues, and Handouts Education comprehension: verbalized understanding and returned demonstration  HOME EXERCISE PROGRAM: Access Code: [email or web address] URL: [email or web address] Date: 12/19/2022  Prepared by: Raynelle Fanning  Exercises - Supine Lower Trunk Rotation  - 2 x daily - 7 x weekly - 1 sets - 5 reps - 10 sec hold - Supine Bridge  - 2 x daily - 7 x weekly - 1-3 sets - 10 reps - 3 -5 sec hold - Supine Piriformis Stretch with Foot on Ground  - 2 x daily - 7 x weekly - 1 sets - 3 reps - 30 sec hold - Seated March  - 1 x daily - 3-4 x weekly - 1 sets - 10 reps - Hip Flexor Stretch with Chair  - 2 x daily - 7 x weekly - 1 sets - 3 reps - 30 sec hold - Marching with Resistance  - 1 x daily - 3-4 x weekly - 1 sets - 10 reps - Prone Hip Extension with Pillow Under Abdomen  - 1 x daily - 3-4 x weekly - 1 sets - 10 reps  ASSESSMENT:  CLINICAL IMPRESSION: Pain is very low on arrival and reports minimal pain over the last week.  Verbal cues to optimize technique with exercises.  Therapist monitoring response and modifying treatment accordingly. She reports "feeling good" at the end of the session.     OBJECTIVE IMPAIRMENTS: Abnormal gait, decreased activity tolerance, decreased ROM, decreased strength, increased muscle spasms, impaired flexibility, postural dysfunction, obesity, and pain.   ACTIVITY LIMITATIONS: bending,  sitting, standing, and locomotion level  PARTICIPATION LIMITATIONS: cleaning, shopping, and community activity  PERSONAL FACTORS: Age, Fitness, and 3+ comorbidities: DM, HTN, previous back surgery, obesity  are also affecting patient's functional outcome.   REHAB POTENTIAL: Excellent  CLINICAL DECISION MAKING: Stable/uncomplicated  EVALUATION COMPLEXITY: Low   GOALS: Goals reviewed with patient? Yes  SHORT TERM GOALS: Target date: 12/26/2022   Patient will be independent with initial HEP.  Baseline:  Goal status: met  2.  Patient will report centralization of pain to low back.  Baseline:  Goal status: met   LONG TERM GOALS: Target date: 01/30/2023   Patient will be independent with advanced/ongoing HEP to improve outcomes and carryover.  Baseline:  Goal status: ongoing  2.  Patient will report > = 75% improvement in low back pain to improve QOL.  Baseline:  Goal status: ongoing  3.  Patient will demonstrate functional pain free lumbar ROM to perform ADLs.   Baseline:  Goal status: ongoing  4.  Patient will demonstrate improved functional strength by decreasing 5XSTS by 5 sec or more  Baseline: 19 sec Goal status: MET  5.  Patient will report 52 on lumbar FOTO to demonstrate improved functional ability.  Baseline: 42 Goal status: INITIAL   6.  Patient will tolerate 30 min of walking to perform normal ADLs and community activities. Baseline: 5-10 min Goal status: INITIAL    PLAN:  PT FREQUENCY: 2x/week  PT DURATION: 8 weeks  PLANNED INTERVENTIONS: Therapeutic exercises, Therapeutic activity, Neuromuscular re-education, Balance training, Gait training, Patient/Family education, Self Care, Joint mobilization, Stair training, Aquatic Therapy, Dry Needling, Electrical stimulation, Spinal mobilization, Cryotherapy, Moist heat, and Manual therapy.  PLAN FOR NEXT SESSION: check progress with goals and functional tests to determine ERO vs. Discharge;  continue to  release and strengthen R psoas, lumbar mobs and QL release, Work on lumbar mobility, LE flex/strength, progress to core strength as tolerated.   Lavinia Sharps, PT 01/25/23 12:33 PM Phone: [phone] Fax: [phone]  [facility] [address], [address] [place], [place] [address] Phone # [phone] Fax [phone]

## 2023-02-01 ENCOUNTER — Ambulatory Visit: Payer: PPO | Admitting: Physical Therapy

## 2023-02-01 ENCOUNTER — Encounter: Payer: Self-pay | Admitting: Physical Therapy

## 2023-02-01 DIAGNOSIS — M5459 Other low back pain: Secondary | ICD-10-CM | POA: Diagnosis not present

## 2023-02-01 DIAGNOSIS — R252 Cramp and spasm: Secondary | ICD-10-CM

## 2023-02-01 DIAGNOSIS — R293 Abnormal posture: Secondary | ICD-10-CM

## 2023-02-01 NOTE — Therapy (Signed)
OUTPATIENT PHYSICAL THERAPY THORACOLUMBAR TREATMENT   Patient Name: Melissa Davenport MRN: [id number] DOB:20-Feb-1941, 82 y.o., female Today's Date: 02/01/2023  END OF SESSION:  PT End of Session - 02/01/23 1008     Visit Number 9    Date for PT Re-Evaluation 02/01/23    Authorization Type HTA    PT Start Time 1010    PT Stop Time 1055    PT Time Calculation (min) 45 min    Activity Tolerance Patient tolerated treatment well    Behavior During Therapy WFL for tasks assessed/performed                    Past Medical History:  Diagnosis Date   Allergic rhinitis    Allergy    allergic rhinitis   Basal cell carcinoma    Cancer (HCC)    Cataract    Complication of anesthesia    pt had nerve block in right shoulder for surgery and pt stated she could not breathe after that and bl/ increased , surgery was at outpatient surgery facilty done by DR Ave Filter   Diabetes mellitus without complication (HCC)    Glaucoma    Graves disease    History of kidney stones    Hyperlipidemia    Hypertension    Hypothyroidism    LVH (left ventricular hypertrophy) 2017   Mild to moderate   Meniere's disease    Mixed dyslipidemia    Osteopenia    Thyroid disease    Vitamin D deficiency    Past Surgical History:  Procedure Laterality Date   ABDOMINAL HYSTERECTOMY     CARDIAC CATHETERIZATION N/A 01/31/2016   Procedure: Left Heart Cath and Coronary Angiography;  Surgeon: Kathleene Hazel, MD;  Location: [facility] INVASIVE CV LAB;  Service: Cardiovascular;  Laterality: N/A;   CATARACT EXTRACTION Bilateral    COLONOSCOPY  2008 & 2014   CYSTOSCOPY/URETEROSCOPY/HOLMIUM LASER/STENT PLACEMENT Right 05/02/2022   Procedure: CYSTOSCOPY/URETEROSCOPY/HOLMIUM LASER/STENT PLACEMENT;  Surgeon: Bjorn Pippin, MD;  Location: [facility];  Service: Urology;  Laterality: Right;   CYSTOSCOPY/URETEROSCOPY/HOLMIUM LASER/STENT PLACEMENT Right 05/05/2022   Procedure: CYSTOSCOPY RIGHT URETEROSCOPY, HOLMIUM LASER  STONE EXTRACTION AND STENT EXCHANGE;  Surgeon: Bjorn Pippin, MD;  Location: [facility];  Service: Urology;  Laterality: Right;   EYE SURGERY     FLEXIBLE SIGMOIDOSCOPY     IR URETERAL STENT RIGHT NEW ACCESS W/O SEP NEPHROSTOMY CATH  05/02/2022   left ankle surgery     left foot surgery     SPINE SURGERY     Patient Active Problem List   Diagnosis Date Noted   Renal stones 05/02/2022   Pyelonephritis 01/28/2022   Allergic rhinitis 09/22/2021   Body mass index (BMI) 34.0-34.9, adult 09/22/2021   Cardiomegaly 09/22/2021   Chronic sinusitis 09/22/2021   Decreased estrogen level 09/22/2021   Microalbuminuria 09/22/2021   Mixed hyperlipidemia 09/22/2021   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   Senile osteopenia 09/22/2021   Type 2 diabetes mellitus with other specified complication (HCC) 09/22/2021   Vitamin D deficiency 09/22/2021   Abnormal EKG    Precordial pain    Brow ptosis 12/24/2013   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 12/24/2013   Eyelid retraction 04/17/2013   Bulging eyes 04/17/2013   Disease of thyroid gland 10/09/2012   Glaucoma suspect 04/17/2012   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   Pseudoaphakia 04/17/2012   Glaucoma suspect of both eyes 04/17/2012   Blepharitis 03/28/2012   Meibomian gland disease 03/28/2012   Binocular vision disorder with diplopia  12/20/2011   Flajani disease 12/20/2011   BP (high blood pressure) 12/20/2011   Hypertropia 12/20/2011   Arthritis, degenerative 12/20/2011    PCP: Moshe Cipro, NP   REFERRING PROVIDER: Murlean Iba, NP   REFERRING DIAG: M51.36 (ICD-10-CM) - Other intervertebral disc degeneration, lumbar region   Rationale for Evaluation and Treatment: Rehabilitation  THERAPY DIAG:  Other low back pain  Abnormal posture  Cramp and spasm  ONSET DATE: 5 weeks ago  SUBJECTIVE:                                                                                                                                                                                            SUBJECTIVE STATEMENT: I sat in car 2.5 hours both ways and wasn't moving much all day on Sunday because of that so I have dull aching in Rt low back that wraps around.  The original hip pain I came in with is much better.    PERTINENT HISTORY:  DM, HTN, spine surgery lumbar 50 years ago, kidney stones  PAIN:  Are you having pain? Yes: NPRS scale:  /10 Pain location: Right ant hip Pain description: achy Aggravating factors: walking sitting in folding chair or firm chair Relieving factors: sitting and lying  PRECAUTIONS: None  WEIGHT BEARING RESTRICTIONS: No  FALLS:  Has patient fallen in last 6 months? No Had previous fall last year where she fell on her right side trying to kill a bug.  LIVING ENVIRONMENT: Lives with: lives alone Lives in: House/apartment Stairs: Yes: External: 4 steps; can reach both Has following equipment at home: Single point cane  OCCUPATION: retired  PLOF: Independent  PATIENT GOALS: get rid of all the pain  NEXT MD VISIT: October  OBJECTIVE:   DIAGNOSTIC FINDINGS:  Sacrum/Coccyx negative; Abdominal CT - neg  PATIENT SURVEYS:  FOTO: 67%, exceeded goal FOTO 42 goal 59  SCREENING FOR RED FLAGS: Bowel or bladder incontinence: No Spinal tumors: No Cauda equina syndrome: No Compression fracture: No Abdominal aneurysm: No  COGNITION: Overall cognitive status: Within functional limits for tasks assessed     SENSATION: WFL  MUSCLE LENGTH: Tight R psoas, B HS and piriformis   POSTURE: forward head, decreased lumbar lordosis, and increased thoracic kyphosis  PALPATION: Right gluteals and psoas  LUMBAR ROM:   AROM eval 8/29  Flexion 40 pain and catch 50 deg no pain  Extension 10 pain 10 no pain  Right lateral flexion 15 pain 15 no pain  Left lateral flexion 23 pulling on R 15 Rt sided tightness  Right rotation 50% Full no pain  Left rotation 50% Full no pain   (  Blank rows = not  tested)  LOWER EXTREMITY ROM:   WFL (see flexibility for tight areas)  LOWER EXTREMITY MMT:    MMT Right eval Left eval  Hip flexion 4+ 4  Hip extension 5 5  Hip abduction 4+ 4+  Hip adduction 4+ 5  Hip internal rotation    Hip external rotation    Knee flexion 5 5  Knee extension 5 5  Ankle dorsiflexion 5 5  Ankle plantarflexion    Ankle inversion    Ankle eversion     (Blank rows = not tested)  LUMBAR SPECIAL TESTS:  Straight leg raise test: Negative and Slump test: Positive R  FUNCTIONAL TESTS:  8/29: 5x STS 11.27 sec  5 times sit to stand: 19.31 sec    12/26/22  12.5 sec one LOB   GAIT: Distance walked: 20 Assistive device utilized: None Level of assistance: Complete Independence Comments: antalgic  TODAY'S TREATMENT:                                                                                                                              DATE:   8/29: NuStep L3 x 10' PT present to review goals and do FOTO FOTO: 67%, goal exceeded Supine LTR x 5x10" Supine PPT x 10, bridge x 10 Supine Rt piriformis stretch 2x30" with strap Standing bil hip flexor stretch foot on 2nd step dynamically 10x right/left Standing HS stretch at the 2nd step 2x15" right/left Yellow loop standing hip flexor march 1x10 bil Yellow loop around feet march 10x and sidestep for the length of the counter 4 laps Seated trunk ext with 15lb bil cable pulley row x 20 Sit to stand from chair 2x5 hands resting on thighs Seated core series holding 2lb bil dumbbell: chest press, forward raise to 90 deg, and then horizontal abduction  x 10 each Standing hip ext at counter x 10 each  01/08/23: Nustep L3 (blue model) x 10 min  Standing bil hip flexor stretch foot on 2nd step dynamically 10x right/left Standing HS stretch at the 2nd step 10x right/left Yellow loop standing hip flexor march 1x10 bil Yellow loop around feet march 10x and sidestep for the length of the counter 4 laps  Seated hip ABD  black loop x 20 Seated core series holding blue plyoball: reach forward and then horizontal abduction  x 10 each (modified pt states hip to shoulder is painful on her rotator cuff but reaches forward and horizontal abd OK) Sit to stand from chair 2x5 hands resting on thighs Seated trunk extension/bil row with cable pulleys 15# 20x  12/28/22: Nustep L5 x 5 Standing bil hip flexor stretch foot on 2nd step 2x20" Yellow loop standing hip flexor march 1x10 bil Yellow loop at ankles march and sidestep over and back across low disc x 10 each LE Seated hip ABD black loop x 20 Seated core series holding blue plyoball: hip to hip, hip to shoulder x 10 each  Sit to stand from chair 2x5 Prone UPA along Rt lumbar spine, TP release manually and Addaday assisted STM to lateral aspect of Rt gluteals and proximal quad      PATIENT EDUCATION:  Education details: HEP update  Person educated: Patient Education method: Explanation, Demonstration, Verbal cues, and Handouts Education comprehension: verbalized understanding and returned demonstration  HOME EXERCISE PROGRAM: Access Code: [email or web address] URL: [email or web address] Date: 12/19/2022 Prepared by: Raynelle Fanning  Exercises - Supine Lower Trunk Rotation  - 2 x daily - 7 x weekly - 1 sets - 5 reps - 10 sec hold - Supine Bridge  - 2 x daily - 7 x weekly - 1-3 sets - 10 reps - 3 -5 sec hold - Supine Piriformis Stretch with Foot on Ground  - 2 x daily - 7 x weekly - 1 sets - 3 reps - 30 sec hold - Seated March  - 1 x daily - 3-4 x weekly - 1 sets - 10 reps - Hip Flexor Stretch with Chair  - 2 x daily - 7 x weekly - 1 sets - 3 reps - 30 sec hold - Marching with Resistance  - 1 x daily - 3-4 x weekly - 1 sets - 10 reps - Prone Hip Extension with Pillow Under Abdomen  - 1 x daily - 3-4 x weekly - 1 sets - 10 reps  ASSESSMENT:  CLINICAL IMPRESSION: Pain in Rt hip has much improved.  Pt exceeded FOTO goal today reaching 67%.  She had some Rt sided  LBP after sitting for over 5 hours in car last Sunday which slowly worked itself out with stretching and exercise today. Pt needs reminders for improved posture and use of deep core to support her back and pelvis.  She has met all goals and is ready to d/c at this time.     OBJECTIVE IMPAIRMENTS: Abnormal gait, decreased activity tolerance, decreased ROM, decreased strength, increased muscle spasms, impaired flexibility, postural dysfunction, obesity, and pain.   ACTIVITY LIMITATIONS: bending, sitting, standing, and locomotion level  PARTICIPATION LIMITATIONS: cleaning, shopping, and community activity  PERSONAL FACTORS: Age, Fitness, and 3+ comorbidities: DM, HTN, previous back surgery, obesity  are also affecting patient's functional outcome.   REHAB POTENTIAL: Excellent  CLINICAL DECISION MAKING: Stable/uncomplicated  EVALUATION COMPLEXITY: Low   GOALS: Goals reviewed with patient? Yes  SHORT TERM GOALS: Target date: 12/26/2022   Patient will be independent with initial HEP.  Baseline:  Goal status: met  2.  Patient will report centralization of pain to low back.  Baseline:  Goal status: met   LONG TERM GOALS: Target date: 01/30/2023   Patient will be independent with advanced/ongoing HEP to improve outcomes and carryover.  Baseline:  Goal status: MET  2.  Patient will report > = 75% improvement in low back pain to improve QOL.  Baseline:  Goal status: MET 8/29  3.  Patient will demonstrate functional pain free lumbar ROM to perform ADLs.   Baseline:  Goal status:   4.  Patient will demonstrate improved functional strength by decreasing 5XSTS by 5 sec or more  Baseline: 19 sec Goal status: MET  5.  Patient will report 67 on lumbar FOTO to demonstrate improved functional ability.  Baseline: 42 Goal status: MET, 67% ON 8/29  6.  Patient will tolerate 30 min of walking to perform normal ADLs and community activities. Baseline: 5-10 min Goal status: MET  8/29    PLAN:  PT FREQUENCY: 2x/week  PT DURATION: 8 weeks  PLANNED  INTERVENTIONS: Therapeutic exercises, Therapeutic activity, Neuromuscular re-education, Balance training, Gait training, Patient/Family education, Self Care, Joint mobilization, Stair training, Aquatic Therapy, Dry Needling, Electrical stimulation, Spinal mobilization, Cryotherapy, Moist heat, and Manual therapy.  PLAN FOR NEXT SESSION: check progress with goals and functional tests to determine ERO vs. Discharge;  continue to release and strengthen R psoas, lumbar mobs and QL release, Work on lumbar mobility, LE flex/strength, progress to core strength as tolerated.   PHYSICAL THERAPY DISCHARGE SUMMARY  Visits from Start of Care: 9  Current functional level related to goals / functional outcomes: See above   Remaining deficits: See above   Education / Equipment: HEP   Patient agrees to discharge. Patient goals were met. Patient is being discharged due to meeting the stated rehab goals.   Morton Peters, PT 02/01/23 10:55 AM  Phone: [phone] Fax: [phone]  [facility] [address] [place], [place] [address] Phone # [phone] Fax [phone]

## 2023-02-02 ENCOUNTER — Ambulatory Visit: Payer: PPO | Admitting: Internal Medicine

## 2023-02-19 ENCOUNTER — Other Ambulatory Visit (HOSPITAL_BASED_OUTPATIENT_CLINIC_OR_DEPARTMENT_OTHER): Payer: Self-pay

## 2023-03-19 DIAGNOSIS — Z6835 Body mass index (BMI) 35.0-35.9, adult: Secondary | ICD-10-CM | POA: Diagnosis not present

## 2023-03-19 DIAGNOSIS — Z1331 Encounter for screening for depression: Secondary | ICD-10-CM | POA: Diagnosis not present

## 2023-03-19 DIAGNOSIS — Z0000 Encounter for general adult medical examination without abnormal findings: Secondary | ICD-10-CM | POA: Diagnosis not present

## 2023-03-20 DIAGNOSIS — H40053 Ocular hypertension, bilateral: Secondary | ICD-10-CM | POA: Diagnosis not present

## 2023-03-20 DIAGNOSIS — H40013 Open angle with borderline findings, low risk, bilateral: Secondary | ICD-10-CM | POA: Diagnosis not present

## 2023-03-22 ENCOUNTER — Ambulatory Visit: Payer: PPO | Admitting: Podiatry

## 2023-03-24 DIAGNOSIS — J069 Acute upper respiratory infection, unspecified: Secondary | ICD-10-CM | POA: Diagnosis not present

## 2023-03-24 DIAGNOSIS — N390 Urinary tract infection, site not specified: Secondary | ICD-10-CM | POA: Diagnosis not present

## 2023-04-06 DIAGNOSIS — N390 Urinary tract infection, site not specified: Secondary | ICD-10-CM | POA: Diagnosis not present

## 2023-04-06 DIAGNOSIS — Z23 Encounter for immunization: Secondary | ICD-10-CM | POA: Diagnosis not present

## 2023-04-12 ENCOUNTER — Encounter: Payer: Self-pay | Admitting: Podiatry

## 2023-04-12 ENCOUNTER — Ambulatory Visit: Payer: PPO | Admitting: Podiatry

## 2023-04-12 DIAGNOSIS — D2371 Other benign neoplasm of skin of right lower limb, including hip: Secondary | ICD-10-CM | POA: Diagnosis not present

## 2023-04-12 DIAGNOSIS — E119 Type 2 diabetes mellitus without complications: Secondary | ICD-10-CM

## 2023-04-12 DIAGNOSIS — B351 Tinea unguium: Secondary | ICD-10-CM | POA: Diagnosis not present

## 2023-04-12 DIAGNOSIS — D2372 Other benign neoplasm of skin of left lower limb, including hip: Secondary | ICD-10-CM

## 2023-04-12 DIAGNOSIS — M79676 Pain in unspecified toe(s): Secondary | ICD-10-CM

## 2023-04-12 NOTE — Progress Notes (Signed)
She presents today chief complaint of painful elongated toenails and calluses.  Objective: Vital signs are stable alert oriented x 3.  Multiple benign skin lesions plantar aspect of the forefoot bilaterally as well as long thick yellow dystrophic onychomycotic nails.  Pulses are palpable.  Assessment: Pain in limb secondary to benign skin lesions and onychomycosis.  Plan: Debridement of toenails and benign skin lesions.  Follow-up with her in 3 months

## 2023-04-20 DIAGNOSIS — H401131 Primary open-angle glaucoma, bilateral, mild stage: Secondary | ICD-10-CM | POA: Diagnosis not present

## 2023-05-14 DIAGNOSIS — R1031 Right lower quadrant pain: Secondary | ICD-10-CM | POA: Diagnosis not present

## 2023-05-14 DIAGNOSIS — N3020 Other chronic cystitis without hematuria: Secondary | ICD-10-CM | POA: Diagnosis not present

## 2023-05-14 DIAGNOSIS — N200 Calculus of kidney: Secondary | ICD-10-CM | POA: Diagnosis not present

## 2023-05-14 DIAGNOSIS — R8271 Bacteriuria: Secondary | ICD-10-CM | POA: Diagnosis not present

## 2023-05-17 DIAGNOSIS — E1169 Type 2 diabetes mellitus with other specified complication: Secondary | ICD-10-CM | POA: Diagnosis not present

## 2023-05-17 DIAGNOSIS — E119 Type 2 diabetes mellitus without complications: Secondary | ICD-10-CM | POA: Diagnosis not present

## 2023-05-17 DIAGNOSIS — I10 Essential (primary) hypertension: Secondary | ICD-10-CM | POA: Diagnosis not present

## 2023-05-17 DIAGNOSIS — L918 Other hypertrophic disorders of the skin: Secondary | ICD-10-CM | POA: Diagnosis not present

## 2023-05-17 DIAGNOSIS — E559 Vitamin D deficiency, unspecified: Secondary | ICD-10-CM | POA: Diagnosis not present

## 2023-05-17 DIAGNOSIS — E782 Mixed hyperlipidemia: Secondary | ICD-10-CM | POA: Diagnosis not present

## 2023-05-17 DIAGNOSIS — E890 Postprocedural hypothyroidism: Secondary | ICD-10-CM | POA: Diagnosis not present

## 2023-05-19 ENCOUNTER — Emergency Department (HOSPITAL_BASED_OUTPATIENT_CLINIC_OR_DEPARTMENT_OTHER): Payer: PPO | Admitting: Radiology

## 2023-05-19 ENCOUNTER — Emergency Department (HOSPITAL_BASED_OUTPATIENT_CLINIC_OR_DEPARTMENT_OTHER)
Admission: EM | Admit: 2023-05-19 | Discharge: 2023-05-19 | Disposition: A | Discharge status: Home / Self Care | Payer: PPO | Attending: Emergency Medicine | Admitting: Emergency Medicine

## 2023-05-19 ENCOUNTER — Other Ambulatory Visit: Payer: Self-pay

## 2023-05-19 ENCOUNTER — Encounter (HOSPITAL_BASED_OUTPATIENT_CLINIC_OR_DEPARTMENT_OTHER): Payer: Self-pay

## 2023-05-19 ENCOUNTER — Emergency Department (HOSPITAL_BASED_OUTPATIENT_CLINIC_OR_DEPARTMENT_OTHER): Payer: PPO

## 2023-05-19 DIAGNOSIS — N12 Tubulo-interstitial nephritis, not specified as acute or chronic: Secondary | ICD-10-CM

## 2023-05-19 DIAGNOSIS — N10 Acute tubulo-interstitial nephritis: Secondary | ICD-10-CM | POA: Diagnosis not present

## 2023-05-19 DIAGNOSIS — J40 Bronchitis, not specified as acute or chronic: Secondary | ICD-10-CM | POA: Diagnosis not present

## 2023-05-19 DIAGNOSIS — Z79899 Other long term (current) drug therapy: Secondary | ICD-10-CM | POA: Diagnosis not present

## 2023-05-19 DIAGNOSIS — Z7982 Long term (current) use of aspirin: Secondary | ICD-10-CM | POA: Diagnosis not present

## 2023-05-19 DIAGNOSIS — D72829 Elevated white blood cell count, unspecified: Secondary | ICD-10-CM | POA: Insufficient documentation

## 2023-05-19 DIAGNOSIS — R109 Unspecified abdominal pain: Secondary | ICD-10-CM | POA: Diagnosis not present

## 2023-05-19 DIAGNOSIS — I10 Essential (primary) hypertension: Secondary | ICD-10-CM | POA: Insufficient documentation

## 2023-05-19 DIAGNOSIS — E119 Type 2 diabetes mellitus without complications: Secondary | ICD-10-CM | POA: Insufficient documentation

## 2023-05-19 DIAGNOSIS — R1031 Right lower quadrant pain: Secondary | ICD-10-CM | POA: Diagnosis present

## 2023-05-19 DIAGNOSIS — R059 Cough, unspecified: Secondary | ICD-10-CM | POA: Diagnosis not present

## 2023-05-19 DIAGNOSIS — D45 Polycythemia vera: Secondary | ICD-10-CM | POA: Insufficient documentation

## 2023-05-19 DIAGNOSIS — Z1152 Encounter for screening for COVID-19: Secondary | ICD-10-CM | POA: Diagnosis not present

## 2023-05-19 DIAGNOSIS — Z7984 Long term (current) use of oral hypoglycemic drugs: Secondary | ICD-10-CM | POA: Diagnosis not present

## 2023-05-19 LAB — URINALYSIS, ROUTINE W REFLEX MICROSCOPIC
Bilirubin Urine: NEGATIVE
Glucose, UA: NEGATIVE mg/dL
Hgb urine dipstick: NEGATIVE
Ketones, ur: NEGATIVE mg/dL
Leukocytes,Ua: NEGATIVE
Nitrite: NEGATIVE
Protein, ur: NEGATIVE mg/dL
Specific Gravity, Urine: 1.013 (ref 1.005–1.030)
pH: 7 (ref 5.0–8.0)

## 2023-05-19 LAB — CBC WITH DIFFERENTIAL/PLATELET
Abs Immature Granulocytes: 0.04 10*3/uL (ref 0.00–0.07)
Basophils Absolute: 0.1 10*3/uL (ref 0.0–0.1)
Basophils Relative: 1 %
Eosinophils Absolute: 0.1 10*3/uL (ref 0.0–0.5)
Eosinophils Relative: 1 %
HCT: 46 % (ref 36.0–46.0)
Hemoglobin: 16 g/dL — ABNORMAL HIGH (ref 12.0–15.0)
Immature Granulocytes: 0 %
Lymphocytes Relative: 16 %
Lymphs Abs: 2 10*3/uL (ref 0.7–4.0)
MCH: 31.4 pg (ref 26.0–34.0)
MCHC: 34.8 g/dL (ref 30.0–36.0)
MCV: 90.2 fL (ref 80.0–100.0)
Monocytes Absolute: 0.8 10*3/uL (ref 0.1–1.0)
Monocytes Relative: 6 %
Neutro Abs: 9.2 10*3/uL — ABNORMAL HIGH (ref 1.7–7.7)
Neutrophils Relative %: 76 %
Platelets: 223 10*3/uL (ref 150–400)
RBC: 5.1 MIL/uL (ref 3.87–5.11)
RDW: 12.3 % (ref 11.5–15.5)
WBC: 12.2 10*3/uL — ABNORMAL HIGH (ref 4.0–10.5)
nRBC: 0 % (ref 0.0–0.2)

## 2023-05-19 LAB — RESP PANEL BY RT-PCR (RSV, FLU A&B, COVID)  RVPGX2
Influenza A by PCR: NEGATIVE
Influenza B by PCR: NEGATIVE
Resp Syncytial Virus by PCR: NEGATIVE
SARS Coronavirus 2 by RT PCR: NEGATIVE

## 2023-05-19 LAB — COMPREHENSIVE METABOLIC PANEL
ALT: 13 U/L (ref 0–44)
AST: 16 U/L (ref 15–41)
Albumin: 4.2 g/dL (ref 3.5–5.0)
Alkaline Phosphatase: 60 U/L (ref 38–126)
Anion gap: 9 (ref 5–15)
BUN: 10 mg/dL (ref 8–23)
CO2: 27 mmol/L (ref 22–32)
Calcium: 9.1 mg/dL (ref 8.9–10.3)
Chloride: 99 mmol/L (ref 98–111)
Creatinine, Ser: 0.78 mg/dL (ref 0.44–1.00)
GFR, Estimated: >60 mL/min (ref >60)
Glucose, Bld: 153 mg/dL — ABNORMAL HIGH (ref 70–99)
Potassium: 3.5 mmol/L (ref 3.5–5.1)
Sodium: 135 mmol/L (ref 135–145)
Total Bilirubin: 1.3 mg/dL — ABNORMAL HIGH (ref <1.2)
Total Protein: 6.8 g/dL (ref 6.5–8.1)

## 2023-05-19 LAB — LACTIC ACID, PLASMA: Lactic Acid, Venous: 1.8 mmol/L (ref 0.5–1.9)

## 2023-05-19 MED ORDER — CEFPODOXIME PROXETIL 200 MG PO TABS: 200.0000 mg | ORAL_TABLET | Freq: Two times a day (BID) | ORAL | 0 refills | Status: AC

## 2023-05-19 MED ORDER — CEFDINIR 300 MG PO CAPS
300.0000 mg | ORAL_CAPSULE | Freq: Once | ORAL | Status: AC
  Administered 2023-05-19: 300 mg via ORAL
  Filled 2023-05-19: qty 1

## 2023-05-19 MED ORDER — IOHEXOL 300 MG/ML  SOLN
100.0000 mL | Freq: Once | INTRAMUSCULAR | Status: AC | PRN
  Administered 2023-05-19: 100 mL via INTRAVENOUS

## 2023-05-19 MED ORDER — ACETAMINOPHEN 325 MG PO TABS
650.0000 mg | ORAL_TABLET | Freq: Once | ORAL | Status: AC
  Administered 2023-05-19: 650 mg via ORAL
  Filled 2023-05-19: qty 2

## 2023-05-19 NOTE — Discharge Instructions (Signed)
Thank you for coming to [facility] Emergency Department. You were seen for flank pain, fever. We did an exam, labs, and imaging, and these showed an elevated white blood cell count and no other abnormalities. We discussed your symptoms and will treat you for pyelonephritis, a kidney infection. Please take cefpodoxime twice per day for 10 days. Please follow up with your primary care provider within 1 week.   Do not hesitate to return to the ED or call 911 if you experience: -Worsening symptoms -Lightheadedness, passing out -Fevers/chills -Anything else that concerns you

## 2023-05-19 NOTE — ED Provider Notes (Signed)
  Physical Exam  BP (!) 145/70   Pulse 90   Temp 99.8 F (37.7 C) (Rectal)   Resp 14   Ht 5\' 3"  (1.6 m)   Wt 90.7 kg   SpO2 92%   BMI 35.43 kg/m   Physical Exam  Procedures  Procedures  ED Course / MDM   Clinical Course as of 05/20/23 0113  Sat May 19, 2023  2137 Discussed extensively with patient, her daughter, and her son-in-law.  Patient states that she always seems to have negative urinalysis but then has cultures that are positive.  She recently had negative UA but was given Macrobid and has taken 3 days of it.  Concern now that her UA will be negative with the Macrobid, concerned that her culture will be negative as well. Still, urine culture is added on. She does have flank pain and leukocytosis with left shift.  No other source of infection can be found.  Reports fever at home.  Given all of these things we will treat for pyelonephritis with cefpodoxime.  Will get 1 dose of cefdinir here and prescribed cefpodoxime.  I discussed extensively with the patient the presumed diagnosis of pyelonephritis as well as the possibility of a small missed kidney stone given that she was scanned with contrast.  I advised that she follow-up with her primary care physician or urologist within the next week.  Advised her to monitor her symptoms carefully and if she does not improve in the next couple of days to return to the emergency department.  She is given specific discharge instructions and return precautions, all questions answered to their satisfaction. [HN]    Clinical Course User Index [HN] Loetta Rough, MD   Medical Decision Making Amount and/or Complexity of Data Reviewed Labs: ordered. Radiology: ordered.  Risk OTC drugs. Prescription drug management.   Patient care taken over by Sherian Maroon PA-C at shift change. Dr. Jearld Fenton, then took over care as she was already familiar with the patient's case.   Maxwell Marion, PA-C 05/20/23 1610    Loetta Rough, MD 05/20/23  [phone]

## 2023-05-19 NOTE — ED Provider Notes (Signed)
[place] EMERGENCY DEPARTMENT AT [facility] Provider Note   CSN: [id number] Arrival date & time: 05/19/23  1633     History {Add pertinent medical, surgical, social history, OB history to HPI:1} Chief Complaint  Patient presents with   Flank Pain    CASEY NEGRO is a 82 y.o. female.   Flank Pain   82 year old female presents emergency department with complaints of right-sided flank pain, feeling ill.  Patient reports symptoms beginning earlier this week.  Was seen by urology with negative UA but with urine culture positive for infection.  Was placed on Macrobid of which she took the first dose yesterday.  States that since Tuesday, has had intermittent right flank pain.  Reports more persistent symptoms over the past couple of days.  States that she woke up today and felt rundown and as if she had the flu but without cough, body aches, nasal congestion.  Reports history of kidney stones as well as kidney infections.  Denies urinary symptoms but states that she does not usually have urinary symptoms associated with urinary tract infections.  Denies any known fever, chills, change in bowel habits, nausea, vomiting, chest pain, shortness of breath.  Past medical history significant for hypertension, upper thyroidism, hyperlipidemia, kidney stone, diabetes mellitus, basal cell carcinoma, LVH, thyroid disease  Home Medications Prior to Admission medications   Medication Sig Start Date End Date Taking? Authorizing Provider  acetaminophen (TYLENOL) 325 MG tablet Take 650 mg by mouth every 6 (six) hours as needed for moderate pain.    [provider]  aspirin 81 MG tablet Take 81 mg by mouth daily.    [provider]  bimatoprost (LUMIGAN) 0.01 % SOLN Place 1 drop into both eyes at bedtime.  03/15/12   [provider]  cetirizine (ZYRTEC) 10 MG tablet Take 10 mg by mouth daily.    [provider]  Cholecalciferol 25 MCG (1000 UT) tablet Take  1,000 Units by mouth daily.    [provider]  Dorzolamide HCl-Timolol Mal PF 2-0.5 % SOLN Place 1 drop into both eyes 2 (two) times daily. 02/20/22   [provider]  fluticasone (FLONASE) 50 MCG/ACT nasal spray Place 1 spray into both nostrils daily as needed for allergies. Use only needed 06/07/10   [provider]  levothyroxine (SYNTHROID, LEVOTHROID) 100 MCG tablet Take 100 mcg by mouth daily before breakfast.    [provider]  losartan-hydrochlorothiazide (HYZAAR) 50-12.5 MG per tablet Take 1 tablet by mouth daily. 08/23/10   [provider]  MACROBID 100 MG capsule 1 capsule with food Orally every 12 hrs for 5 days 04/09/23   [provider]  metFORMIN (GLUCOPHAGE-XR) 500 MG 24 hr tablet Take 500 mg by mouth daily after supper.    [provider]  metoprolol succinate (TOPROL-XL) 50 MG 24 hr tablet Take 50 mg by mouth daily. Take with or immediately following a meal.    [provider]  Multiple Vitamin (MULTI-VITAMINS) TABS Take 1 tablet by mouth daily.    [provider]  niacin 500 MG tablet Take 500 mg by mouth daily.    [provider]  Omega-3 1000 MG CAPS Take 2,000 mg by mouth daily.    [provider]  pravastatin (PRAVACHOL) 20 MG tablet Take 20 mg by mouth daily.    [provider]  sulfamethoxazole-trimethoprim (BACTRIM DS) 800-160 MG tablet Take 1 tablet by mouth at bedtime. 05/03/22   Bjorn Pippin, MD  Vitamin D, Ergocalciferol, (  DRISDOL) 1.25 MG (50000 UNIT) CAPS capsule Take 50,000 Units by mouth every Monday. 03/24/22   [provider]  zinc gluconate 50 MG tablet Take 50 mg by mouth daily.    [provider]  valsartan (DIOVAN) 160 MG tablet Take 1 tablet (160 mg total) by mouth daily. 09/03/20 02/16/21  Burchette, Elberta Fortis, MD      Allergies    Alphagan [brimonidine], Flagyl [metronidazole], Ketek [telithromycin], and Levaquin [levofloxacin]    Review  of Systems   Review of Systems  Genitourinary:  Positive for flank pain.  All other systems reviewed and are negative.   Physical Exam Updated Vital Signs BP (!) 145/70   Pulse 90   Temp 99.8 F (37.7 C) (Rectal)   Resp 14   Ht 5\' 3"  (1.6 m)   Wt 90.7 kg   SpO2 92%   BMI 35.43 kg/m  Physical Exam Vitals and nursing note reviewed.  Constitutional:      General: She is not in acute distress.    Appearance: She is well-developed.  HENT:     Head: Normocephalic and atraumatic.  Eyes:     Conjunctiva/sclera: Conjunctivae normal.  Cardiovascular:     Rate and Rhythm: Normal rate and regular rhythm.  Pulmonary:     Effort: Pulmonary effort is normal. No respiratory distress.     Breath sounds: Normal breath sounds. No wheezing, rhonchi or rales.  Abdominal:     Palpations: Abdomen is soft.     Tenderness: There is abdominal tenderness in the right lower quadrant. There is right CVA tenderness. There is no guarding. Negative signs include Murphy's sign.  Musculoskeletal:        General: No swelling.     Cervical back: Neck supple.  Skin:    General: Skin is warm and dry.     Capillary Refill: Capillary refill takes less than 2 seconds.  Neurological:     Mental Status: She is alert.  Psychiatric:        Mood and Affect: Mood normal.     ED Results / Procedures / Treatments   Labs (all labs ordered are listed, but only abnormal results are displayed) Labs Reviewed  CBC WITH DIFFERENTIAL/PLATELET - Abnormal; Notable for the following components:      Result Value   WBC 12.2 (*)    Hemoglobin 16.0 (*)    Neutro Abs 9.2 (*)    All other components within normal limits  URINALYSIS, ROUTINE W REFLEX MICROSCOPIC  COMPREHENSIVE METABOLIC PANEL  LACTIC ACID, PLASMA  LACTIC ACID, PLASMA    EKG None  Radiology No results found.  Procedures Procedures  {Document cardiac monitor, telemetry assessment procedure when appropriate:1}  Medications Ordered in  ED Medications  acetaminophen (TYLENOL) tablet 650 mg (has no administration in time range)    ED Course/ Medical Decision Making/ A&P   {   Click here for ABCD2, HEART and other calculatorsREFRESH Note before signing :1}                              Medical Decision Making Amount and/or Complexity of Data Reviewed Labs: ordered. Radiology: ordered.  Risk OTC drugs.   This patient presents to the ED for concern of ***, this involves an extensive number of treatment options, and is a complaint that carries with it a high risk of complications and morbidity.  The differential diagnosis includes ***   Co morbidities that complicate the patient  evaluation  See HPI   Additional history obtained:  Additional history obtained from EMR External records from outside source obtained and reviewed including hospital records   Lab Tests:  I Ordered, and personally interpreted labs.  The pertinent results include:  ***   Imaging Studies ordered:  I ordered imaging studies including ***  I independently visualized and interpreted imaging which showed *** I agree with the radiologist interpretation   Cardiac Monitoring: / EKG:  The patient was maintained on a cardiac monitor.  I personally viewed and interpreted the cardiac monitored which showed an underlying rhythm of: ***   Consultations Obtained:  I requested consultation with the ***,  and discussed lab and imaging findings as well as pertinent plan - they recommend: ***   Problem List / ED Course / Critical interventions / Medication management  *** I ordered medication including ***  for ***  Reevaluation of the patient after these medicines showed that the patient {resolved/improved/worsened:23923::"improved"} I have reviewed the patients home medicines and have made adjustments as needed   Social Determinants of Health:  ***   Test / Admission - Considered:  Vitals signs significant for ***. Otherwise  within normal range and stable throughout visit. Laboratory/imaging studies significant for: *** *** Treatment plan were discussed at length with patient and they knowledge understanding was agreeable to said plan.  Appropriate consultations were made as described in the ED course.  Patient was stable upon admission to the hospital.   {Document critical care time when appropriate:1} {Document review of labs and clinical decision tools ie heart score, Chads2Vasc2 etc:1}  {Document your independent review of radiology images, and any outside records:1} {Document your discussion with family members, caretakers, and with consultants:1} {Document social determinants of health affecting pt's care:1} {Document your decision making why or why not admission, treatments were needed:1} Final Clinical Impression(s) / ED Diagnoses Final diagnoses:  None    Rx / DC Orders ED Discharge Orders     None

## 2023-05-19 NOTE — ED Triage Notes (Signed)
Pt POV from UC reporting R side flank pain, frequent UTIs, went to UC and advised to come to ED for suspected pyelonephritis.

## 2023-05-22 DIAGNOSIS — R109 Unspecified abdominal pain: Secondary | ICD-10-CM | POA: Diagnosis not present

## 2023-05-22 DIAGNOSIS — N390 Urinary tract infection, site not specified: Secondary | ICD-10-CM | POA: Diagnosis not present

## 2023-05-22 DIAGNOSIS — E782 Mixed hyperlipidemia: Secondary | ICD-10-CM | POA: Diagnosis not present

## 2023-05-22 LAB — URINE CULTURE: Culture: 20000 — AB

## 2023-05-23 ENCOUNTER — Telehealth (HOSPITAL_BASED_OUTPATIENT_CLINIC_OR_DEPARTMENT_OTHER): Payer: Self-pay | Admitting: *Deleted

## 2023-05-23 NOTE — Telephone Encounter (Signed)
Post ED Visit - Positive Culture Follow-up  Culture report reviewed by antimicrobial stewardship pharmacist: [facility] [x]  Ivery Quale, [place].D. []  Celedonio Miyamoto, Pharm.D., BCPS AQ-ID []  Garvin Fila, Pharm.D., BCPS []  Georgina Pillion, Pharm.D., BCPS []  [place], [address].D., BCPS, AAHIVP []  Estella Husk, Pharm.D., BCPS, AAHIVP []  Lysle Pearl, PharmD, BCPS []  Phillips Climes, PharmD, BCPS []  Agapito Games, PharmD, BCPS []  Verlan Friends, PharmD []  Mervyn Gay, PharmD, BCPS []  Vinnie Level, PharmD  [facility] []  Len Childs, PharmD []  Greer Pickerel, PharmD []  Adalberto Cole, PharmD []  Perlie Gold, Rph []  Lonell Face) Jean Rosenthal, PharmD []  Earl Many, PharmD []  Junita Push, PharmD []  Dorna Leitz, PharmD []  Terrilee Files, PharmD []  Lynann Beaver, PharmD []  Keturah Barre, PharmD []  Loralee Pacas, PharmD []  Bernadene Person, PharmD   Positive urine culture Treated with Cefpodoxime Proxetil, organism sensitive to the same and no further patient follow-up is required at this time.  Virl Axe [facility] 05/23/2023, 2:04 PM

## 2023-07-13 ENCOUNTER — Ambulatory Visit: Payer: PPO | Admitting: Podiatry

## 2023-07-17 ENCOUNTER — Ambulatory Visit: Payer: PPO | Admitting: Podiatry

## 2023-07-19 ENCOUNTER — Ambulatory Visit: Payer: PPO | Admitting: Podiatry

## 2023-07-26 ENCOUNTER — Ambulatory Visit: Payer: PPO | Admitting: Podiatry

## 2023-08-02 ENCOUNTER — Ambulatory Visit: Payer: PPO | Admitting: Podiatry

## 2023-08-02 ENCOUNTER — Encounter: Payer: Self-pay | Admitting: Podiatry

## 2023-08-02 DIAGNOSIS — B351 Tinea unguium: Secondary | ICD-10-CM

## 2023-08-02 DIAGNOSIS — M79676 Pain in unspecified toe(s): Secondary | ICD-10-CM | POA: Diagnosis not present

## 2023-08-02 DIAGNOSIS — D2372 Other benign neoplasm of skin of left lower limb, including hip: Secondary | ICD-10-CM | POA: Diagnosis not present

## 2023-08-02 DIAGNOSIS — D2371 Other benign neoplasm of skin of right lower limb, including hip: Secondary | ICD-10-CM

## 2023-08-02 DIAGNOSIS — E119 Type 2 diabetes mellitus without complications: Secondary | ICD-10-CM | POA: Diagnosis not present

## 2023-08-02 NOTE — Progress Notes (Signed)
 Presents today chief complaint of painful elongated toenails and benign skin lesions.  Objective: Pulses are palpable.  Toes are mildly erythematous there is no open lesions or wounds.  Toenails are long thick yellow dystrophic with mycotic benign skin lesion plantar aspect of the forefoot left and the forefoot right.  No open lesions or wounds associated with these.  Assessment: Pain limb secondary to onychomycosis and benign skin lesions bilateral.  Plan: Debridement of benign skin lesion debridement of toenails 1 through 5 bilateral.

## 2023-08-10 DIAGNOSIS — Z03818 Encounter for observation for suspected exposure to other biological agents ruled out: Secondary | ICD-10-CM | POA: Diagnosis not present

## 2023-08-10 DIAGNOSIS — R051 Acute cough: Secondary | ICD-10-CM | POA: Diagnosis not present

## 2023-08-10 DIAGNOSIS — J069 Acute upper respiratory infection, unspecified: Secondary | ICD-10-CM | POA: Diagnosis not present

## 2023-08-10 DIAGNOSIS — Z6836 Body mass index (BMI) 36.0-36.9, adult: Secondary | ICD-10-CM | POA: Diagnosis not present

## 2023-09-11 DIAGNOSIS — Z6835 Body mass index (BMI) 35.0-35.9, adult: Secondary | ICD-10-CM | POA: Diagnosis not present

## 2023-09-11 DIAGNOSIS — I10 Essential (primary) hypertension: Secondary | ICD-10-CM | POA: Diagnosis not present

## 2023-09-18 DIAGNOSIS — I10 Essential (primary) hypertension: Secondary | ICD-10-CM | POA: Diagnosis not present

## 2023-10-18 DIAGNOSIS — H401131 Primary open-angle glaucoma, bilateral, mild stage: Secondary | ICD-10-CM | POA: Diagnosis not present

## 2023-10-19 DIAGNOSIS — R300 Dysuria: Secondary | ICD-10-CM | POA: Diagnosis not present

## 2023-10-19 DIAGNOSIS — M79642 Pain in left hand: Secondary | ICD-10-CM | POA: Diagnosis not present

## 2023-10-19 DIAGNOSIS — I10 Essential (primary) hypertension: Secondary | ICD-10-CM | POA: Diagnosis not present

## 2023-10-19 DIAGNOSIS — M533 Sacrococcygeal disorders, not elsewhere classified: Secondary | ICD-10-CM | POA: Diagnosis not present

## 2023-11-01 ENCOUNTER — Encounter: Payer: Self-pay | Admitting: Podiatry

## 2023-11-01 ENCOUNTER — Ambulatory Visit: Payer: PPO | Admitting: Podiatry

## 2023-11-01 DIAGNOSIS — D2372 Other benign neoplasm of skin of left lower limb, including hip: Secondary | ICD-10-CM | POA: Diagnosis not present

## 2023-11-01 DIAGNOSIS — D2371 Other benign neoplasm of skin of right lower limb, including hip: Secondary | ICD-10-CM | POA: Diagnosis not present

## 2023-11-01 DIAGNOSIS — E119 Type 2 diabetes mellitus without complications: Secondary | ICD-10-CM

## 2023-11-01 DIAGNOSIS — M79676 Pain in unspecified toe(s): Secondary | ICD-10-CM | POA: Diagnosis not present

## 2023-11-01 DIAGNOSIS — B351 Tinea unguium: Secondary | ICD-10-CM | POA: Diagnosis not present

## 2023-11-01 NOTE — Progress Notes (Signed)
 Presents today chief complaint of painful elongated toenails and benign skin lesions.  Objective: Pulses are palpable.  Toes are mildly erythematous there is no open lesions or wounds.  Toenails are long thick yellow dystrophic with mycotic benign skin lesion plantar aspect of the forefoot left and the forefoot right.  No open lesions or wounds associated with these.  Assessment: Pain limb secondary to onychomycosis and benign skin lesions bilateral.  Plan: Debridement of benign skin lesion debridement of toenails 1 through 5 bilateral.

## 2023-11-12 DIAGNOSIS — N952 Postmenopausal atrophic vaginitis: Secondary | ICD-10-CM | POA: Diagnosis not present

## 2023-11-12 DIAGNOSIS — N3020 Other chronic cystitis without hematuria: Secondary | ICD-10-CM | POA: Diagnosis not present

## 2023-11-12 DIAGNOSIS — N200 Calculus of kidney: Secondary | ICD-10-CM | POA: Diagnosis not present

## 2023-11-16 DIAGNOSIS — E119 Type 2 diabetes mellitus without complications: Secondary | ICD-10-CM | POA: Diagnosis not present

## 2023-11-16 DIAGNOSIS — H26491 Other secondary cataract, right eye: Secondary | ICD-10-CM | POA: Diagnosis not present

## 2023-11-16 DIAGNOSIS — I10 Essential (primary) hypertension: Secondary | ICD-10-CM | POA: Diagnosis not present

## 2023-11-16 DIAGNOSIS — H401131 Primary open-angle glaucoma, bilateral, mild stage: Secondary | ICD-10-CM | POA: Diagnosis not present

## 2023-11-19 DIAGNOSIS — E1121 Type 2 diabetes mellitus with diabetic nephropathy: Secondary | ICD-10-CM | POA: Diagnosis not present

## 2023-11-19 DIAGNOSIS — E1169 Type 2 diabetes mellitus with other specified complication: Secondary | ICD-10-CM | POA: Diagnosis not present

## 2023-11-19 DIAGNOSIS — I10 Essential (primary) hypertension: Secondary | ICD-10-CM | POA: Diagnosis not present

## 2023-11-19 DIAGNOSIS — E1165 Type 2 diabetes mellitus with hyperglycemia: Secondary | ICD-10-CM | POA: Diagnosis not present

## 2023-11-22 DIAGNOSIS — R197 Diarrhea, unspecified: Secondary | ICD-10-CM | POA: Diagnosis not present

## 2023-11-22 DIAGNOSIS — E782 Mixed hyperlipidemia: Secondary | ICD-10-CM | POA: Diagnosis not present

## 2023-11-22 DIAGNOSIS — E1169 Type 2 diabetes mellitus with other specified complication: Secondary | ICD-10-CM | POA: Diagnosis not present

## 2023-11-22 DIAGNOSIS — I10 Essential (primary) hypertension: Secondary | ICD-10-CM | POA: Diagnosis not present

## 2023-11-23 DIAGNOSIS — Z9841 Cataract extraction status, right eye: Secondary | ICD-10-CM | POA: Diagnosis not present

## 2023-11-23 DIAGNOSIS — H2511 Age-related nuclear cataract, right eye: Secondary | ICD-10-CM | POA: Diagnosis not present

## 2023-11-23 DIAGNOSIS — Z961 Presence of intraocular lens: Secondary | ICD-10-CM | POA: Diagnosis not present

## 2023-12-03 DIAGNOSIS — Z6835 Body mass index (BMI) 35.0-35.9, adult: Secondary | ICD-10-CM | POA: Diagnosis not present

## 2023-12-03 DIAGNOSIS — E1165 Type 2 diabetes mellitus with hyperglycemia: Secondary | ICD-10-CM | POA: Diagnosis not present

## 2023-12-03 DIAGNOSIS — E890 Postprocedural hypothyroidism: Secondary | ICD-10-CM | POA: Diagnosis not present

## 2023-12-03 DIAGNOSIS — E1121 Type 2 diabetes mellitus with diabetic nephropathy: Secondary | ICD-10-CM | POA: Diagnosis not present

## 2023-12-03 DIAGNOSIS — M1990 Unspecified osteoarthritis, unspecified site: Secondary | ICD-10-CM | POA: Diagnosis not present

## 2023-12-03 DIAGNOSIS — M533 Sacrococcygeal disorders, not elsewhere classified: Secondary | ICD-10-CM | POA: Diagnosis not present

## 2023-12-21 DIAGNOSIS — E1165 Type 2 diabetes mellitus with hyperglycemia: Secondary | ICD-10-CM | POA: Diagnosis not present

## 2023-12-21 DIAGNOSIS — I10 Essential (primary) hypertension: Secondary | ICD-10-CM | POA: Diagnosis not present

## 2023-12-21 DIAGNOSIS — E1169 Type 2 diabetes mellitus with other specified complication: Secondary | ICD-10-CM | POA: Diagnosis not present

## 2023-12-21 DIAGNOSIS — E1121 Type 2 diabetes mellitus with diabetic nephropathy: Secondary | ICD-10-CM | POA: Diagnosis not present

## 2023-12-31 DIAGNOSIS — M25551 Pain in right hip: Secondary | ICD-10-CM | POA: Diagnosis not present

## 2024-01-03 DIAGNOSIS — M1990 Unspecified osteoarthritis, unspecified site: Secondary | ICD-10-CM | POA: Diagnosis not present

## 2024-01-03 DIAGNOSIS — E1121 Type 2 diabetes mellitus with diabetic nephropathy: Secondary | ICD-10-CM | POA: Diagnosis not present

## 2024-01-03 DIAGNOSIS — E1165 Type 2 diabetes mellitus with hyperglycemia: Secondary | ICD-10-CM | POA: Diagnosis not present

## 2024-01-03 DIAGNOSIS — E890 Postprocedural hypothyroidism: Secondary | ICD-10-CM | POA: Diagnosis not present

## 2024-01-03 DIAGNOSIS — I10 Essential (primary) hypertension: Secondary | ICD-10-CM | POA: Diagnosis not present

## 2024-01-03 DIAGNOSIS — E1169 Type 2 diabetes mellitus with other specified complication: Secondary | ICD-10-CM | POA: Diagnosis not present

## 2024-01-16 DIAGNOSIS — Z961 Presence of intraocular lens: Secondary | ICD-10-CM | POA: Diagnosis not present

## 2024-01-16 DIAGNOSIS — H401131 Primary open-angle glaucoma, bilateral, mild stage: Secondary | ICD-10-CM | POA: Diagnosis not present

## 2024-01-16 DIAGNOSIS — H26491 Other secondary cataract, right eye: Secondary | ICD-10-CM | POA: Diagnosis not present

## 2024-01-20 DIAGNOSIS — E1165 Type 2 diabetes mellitus with hyperglycemia: Secondary | ICD-10-CM | POA: Diagnosis not present

## 2024-01-20 DIAGNOSIS — I10 Essential (primary) hypertension: Secondary | ICD-10-CM | POA: Diagnosis not present

## 2024-01-20 DIAGNOSIS — E1121 Type 2 diabetes mellitus with diabetic nephropathy: Secondary | ICD-10-CM | POA: Diagnosis not present

## 2024-01-20 DIAGNOSIS — E1169 Type 2 diabetes mellitus with other specified complication: Secondary | ICD-10-CM | POA: Diagnosis not present

## 2024-01-28 DIAGNOSIS — M25551 Pain in right hip: Secondary | ICD-10-CM | POA: Diagnosis not present

## 2024-01-31 ENCOUNTER — Ambulatory Visit: Admitting: Podiatry

## 2024-01-31 DIAGNOSIS — D2372 Other benign neoplasm of skin of left lower limb, including hip: Secondary | ICD-10-CM | POA: Diagnosis not present

## 2024-01-31 DIAGNOSIS — E119 Type 2 diabetes mellitus without complications: Secondary | ICD-10-CM | POA: Diagnosis not present

## 2024-01-31 DIAGNOSIS — M79676 Pain in unspecified toe(s): Secondary | ICD-10-CM | POA: Diagnosis not present

## 2024-01-31 DIAGNOSIS — B351 Tinea unguium: Secondary | ICD-10-CM | POA: Diagnosis not present

## 2024-01-31 NOTE — Progress Notes (Signed)
 Presents today chief complaint of painful elongated toenails and benign skin lesions.  Objective: Pulses are palpable.  Toes are mildly erythematous there is no open lesions or wounds.  Toenails are long thick yellow dystrophic with mycotic benign skin lesion plantar aspect of the forefoot left and the forefoot right.  No open lesions or wounds associated with these.  Assessment: Pain limb secondary to onychomycosis and benign skin lesions bilateral.  Plan: Debridement of benign skin lesion debridement of toenails 1 through 5 bilateral.

## 2024-02-03 DIAGNOSIS — E1169 Type 2 diabetes mellitus with other specified complication: Secondary | ICD-10-CM | POA: Diagnosis not present

## 2024-02-03 DIAGNOSIS — I10 Essential (primary) hypertension: Secondary | ICD-10-CM | POA: Diagnosis not present

## 2024-02-03 DIAGNOSIS — M1990 Unspecified osteoarthritis, unspecified site: Secondary | ICD-10-CM | POA: Diagnosis not present

## 2024-02-03 DIAGNOSIS — E890 Postprocedural hypothyroidism: Secondary | ICD-10-CM | POA: Diagnosis not present

## 2024-02-03 DIAGNOSIS — E1165 Type 2 diabetes mellitus with hyperglycemia: Secondary | ICD-10-CM | POA: Diagnosis not present

## 2024-02-03 DIAGNOSIS — E1121 Type 2 diabetes mellitus with diabetic nephropathy: Secondary | ICD-10-CM | POA: Diagnosis not present

## 2024-02-08 DIAGNOSIS — M25551 Pain in right hip: Secondary | ICD-10-CM | POA: Diagnosis not present

## 2024-02-11 DIAGNOSIS — M25551 Pain in right hip: Secondary | ICD-10-CM | POA: Diagnosis not present

## 2024-02-13 DIAGNOSIS — M25551 Pain in right hip: Secondary | ICD-10-CM | POA: Diagnosis not present

## 2024-02-18 DIAGNOSIS — M25551 Pain in right hip: Secondary | ICD-10-CM | POA: Diagnosis not present

## 2024-02-22 DIAGNOSIS — E1169 Type 2 diabetes mellitus with other specified complication: Secondary | ICD-10-CM | POA: Diagnosis not present

## 2024-02-22 DIAGNOSIS — Z0000 Encounter for general adult medical examination without abnormal findings: Secondary | ICD-10-CM | POA: Diagnosis not present

## 2024-02-22 DIAGNOSIS — R809 Proteinuria, unspecified: Secondary | ICD-10-CM | POA: Diagnosis not present

## 2024-02-22 DIAGNOSIS — Z23 Encounter for immunization: Secondary | ICD-10-CM | POA: Diagnosis not present

## 2024-02-25 DIAGNOSIS — M25551 Pain in right hip: Secondary | ICD-10-CM | POA: Diagnosis not present

## 2024-02-27 DIAGNOSIS — M25551 Pain in right hip: Secondary | ICD-10-CM | POA: Diagnosis not present

## 2024-03-04 DIAGNOSIS — E1165 Type 2 diabetes mellitus with hyperglycemia: Secondary | ICD-10-CM | POA: Diagnosis not present

## 2024-03-04 DIAGNOSIS — E1121 Type 2 diabetes mellitus with diabetic nephropathy: Secondary | ICD-10-CM | POA: Diagnosis not present

## 2024-03-04 DIAGNOSIS — E1169 Type 2 diabetes mellitus with other specified complication: Secondary | ICD-10-CM | POA: Diagnosis not present

## 2024-03-04 DIAGNOSIS — I10 Essential (primary) hypertension: Secondary | ICD-10-CM | POA: Diagnosis not present

## 2024-03-05 DIAGNOSIS — M25551 Pain in right hip: Secondary | ICD-10-CM | POA: Diagnosis not present

## 2024-03-14 DIAGNOSIS — Z23 Encounter for immunization: Secondary | ICD-10-CM | POA: Diagnosis not present

## 2024-03-14 DIAGNOSIS — H401131 Primary open-angle glaucoma, bilateral, mild stage: Secondary | ICD-10-CM | POA: Diagnosis not present

## 2024-03-14 DIAGNOSIS — H6693 Otitis media, unspecified, bilateral: Secondary | ICD-10-CM | POA: Diagnosis not present

## 2024-03-26 DIAGNOSIS — M25551 Pain in right hip: Secondary | ICD-10-CM | POA: Diagnosis not present

## 2024-04-09 DIAGNOSIS — M25551 Pain in right hip: Secondary | ICD-10-CM | POA: Diagnosis not present

## 2024-04-11 DIAGNOSIS — M25551 Pain in right hip: Secondary | ICD-10-CM | POA: Diagnosis not present

## 2024-04-14 DIAGNOSIS — M25551 Pain in right hip: Secondary | ICD-10-CM | POA: Diagnosis not present

## 2024-04-16 DIAGNOSIS — M25551 Pain in right hip: Secondary | ICD-10-CM | POA: Diagnosis not present

## 2024-04-21 DIAGNOSIS — M25551 Pain in right hip: Secondary | ICD-10-CM | POA: Diagnosis not present

## 2024-04-23 DIAGNOSIS — M25551 Pain in right hip: Secondary | ICD-10-CM | POA: Diagnosis not present

## 2024-05-06 ENCOUNTER — Ambulatory Visit: Admitting: Podiatry

## 2024-05-13 ENCOUNTER — Ambulatory Visit: Admitting: Podiatry

## 2024-05-22 ENCOUNTER — Ambulatory Visit: Admitting: Podiatry

## 2024-05-27 ENCOUNTER — Ambulatory Visit: Admitting: Podiatry

## 2024-05-27 ENCOUNTER — Encounter: Payer: Self-pay | Admitting: Podiatry

## 2024-05-27 DIAGNOSIS — D2372 Other benign neoplasm of skin of left lower limb, including hip: Secondary | ICD-10-CM | POA: Diagnosis not present

## 2024-05-27 DIAGNOSIS — M79676 Pain in unspecified toe(s): Secondary | ICD-10-CM | POA: Diagnosis not present

## 2024-05-27 DIAGNOSIS — E119 Type 2 diabetes mellitus without complications: Secondary | ICD-10-CM

## 2024-05-27 DIAGNOSIS — B351 Tinea unguium: Secondary | ICD-10-CM

## 2024-05-27 DIAGNOSIS — D2371 Other benign neoplasm of skin of right lower limb, including hip: Secondary | ICD-10-CM

## 2024-05-27 NOTE — Progress Notes (Signed)
 Presents today chief complaint of painful elongated toenails and benign skin lesions.  Objective: Pulses are palpable.  Toes are mildly erythematous there is no open lesions or wounds.  Toenails are long thick yellow dystrophic with mycotic benign skin lesion plantar aspect of the forefoot left and the forefoot right.  No open lesions or wounds associated with these.  Assessment: Pain limb secondary to onychomycosis and benign skin lesions bilateral.  Plan: Debridement of benign skin lesion debridement of toenails 1 through 5 bilateral.

## 2024-08-28 ENCOUNTER — Ambulatory Visit: Admitting: Podiatry
